# Patient Record
Sex: Male | Born: 1950 | Race: White | Hispanic: No | Marital: Married | Smoking: Never smoker
Health system: Southern US, Community
[De-identification: ages and names within clinical notes are randomized; demographics above are authoritative.]

## PROBLEM LIST (undated history)

## (undated) DIAGNOSIS — H7090 Unspecified mastoiditis, unspecified ear: Secondary | ICD-10-CM

## (undated) DIAGNOSIS — K2270 Barrett's esophagus without dysplasia: Secondary | ICD-10-CM

## (undated) DIAGNOSIS — N189 Chronic kidney disease, unspecified: Secondary | ICD-10-CM

## (undated) DIAGNOSIS — E7800 Pure hypercholesterolemia, unspecified: Secondary | ICD-10-CM

## (undated) DIAGNOSIS — M1990 Unspecified osteoarthritis, unspecified site: Secondary | ICD-10-CM

## (undated) DIAGNOSIS — L98499 Non-pressure chronic ulcer of skin of other sites with unspecified severity: Secondary | ICD-10-CM

## (undated) DIAGNOSIS — I10 Essential (primary) hypertension: Secondary | ICD-10-CM

## (undated) DIAGNOSIS — R7303 Prediabetes: Secondary | ICD-10-CM

## (undated) DIAGNOSIS — K219 Gastro-esophageal reflux disease without esophagitis: Secondary | ICD-10-CM

## (undated) DIAGNOSIS — IMO0001 Reserved for inherently not codable concepts without codable children: Secondary | ICD-10-CM

## (undated) DIAGNOSIS — S92353A Displaced fracture of fifth metatarsal bone, unspecified foot, initial encounter for closed fracture: Secondary | ICD-10-CM

## (undated) DIAGNOSIS — N400 Enlarged prostate without lower urinary tract symptoms: Secondary | ICD-10-CM

## (undated) HISTORY — PX: TONSILLECTOMY: SUR1361

## (undated) HISTORY — DX: Reserved for inherently not codable concepts without codable children: IMO0001

## (undated) HISTORY — PX: COLONOSCOPY: SHX174

## (undated) HISTORY — DX: Benign prostatic hyperplasia without lower urinary tract symptoms: N40.0

## (undated) HISTORY — DX: Essential (primary) hypertension: I10

## (undated) HISTORY — DX: Displaced fracture of fifth metatarsal bone, unspecified foot, initial encounter for closed fracture: S92.353A

## (undated) HISTORY — DX: Unspecified mastoiditis, unspecified ear: H70.90

## (undated) HISTORY — PX: BACK SURGERY: SHX140

## (undated) HISTORY — PX: TOTAL SHOULDER REPLACEMENT: SUR1217

## (undated) HISTORY — DX: Non-pressure chronic ulcer of skin of other sites with unspecified severity: L98.499

## (undated) HISTORY — PX: UPPER GASTROINTESTINAL ENDOSCOPY: SHX188

## (undated) HISTORY — DX: Barrett's esophagus without dysplasia: K22.70

## (undated) HISTORY — PX: KNEE ARTHROSCOPY: SUR90

---

## 2003-04-24 ENCOUNTER — Ambulatory Visit (HOSPITAL_COMMUNITY): Admission: RE | Admit: 2003-04-24 | Discharge: 2003-04-24 | Payer: Self-pay | Admitting: Internal Medicine

## 2003-10-16 ENCOUNTER — Ambulatory Visit (HOSPITAL_COMMUNITY): Admission: RE | Admit: 2003-10-16 | Discharge: 2003-10-16 | Payer: Self-pay | Admitting: Orthopedic Surgery

## 2003-10-16 ENCOUNTER — Ambulatory Visit (HOSPITAL_BASED_OUTPATIENT_CLINIC_OR_DEPARTMENT_OTHER): Admission: RE | Admit: 2003-10-16 | Discharge: 2003-10-16 | Payer: Self-pay | Admitting: Orthopedic Surgery

## 2005-07-14 ENCOUNTER — Ambulatory Visit (HOSPITAL_COMMUNITY): Admission: RE | Admit: 2005-07-14 | Discharge: 2005-07-14 | Payer: Self-pay | Admitting: Internal Medicine

## 2005-07-14 ENCOUNTER — Ambulatory Visit: Payer: Self-pay | Admitting: Internal Medicine

## 2005-07-14 ENCOUNTER — Encounter (INDEPENDENT_AMBULATORY_CARE_PROVIDER_SITE_OTHER): Payer: Self-pay | Admitting: Internal Medicine

## 2007-12-02 HISTORY — PX: HEMORRHOID SURGERY: SHX153

## 2007-12-02 HISTORY — PX: OTHER SURGICAL HISTORY: SHX169

## 2008-02-25 ENCOUNTER — Ambulatory Visit (HOSPITAL_COMMUNITY): Admission: RE | Admit: 2008-02-25 | Discharge: 2008-02-25 | Payer: Self-pay | Admitting: Surgery

## 2008-02-25 ENCOUNTER — Encounter (INDEPENDENT_AMBULATORY_CARE_PROVIDER_SITE_OTHER): Payer: Self-pay | Admitting: Surgery

## 2008-06-08 ENCOUNTER — Ambulatory Visit (HOSPITAL_COMMUNITY): Admission: RE | Admit: 2008-06-08 | Discharge: 2008-06-08 | Payer: Self-pay | Admitting: Orthopedic Surgery

## 2008-07-24 ENCOUNTER — Ambulatory Visit (HOSPITAL_BASED_OUTPATIENT_CLINIC_OR_DEPARTMENT_OTHER): Admission: RE | Admit: 2008-07-24 | Discharge: 2008-07-24 | Payer: Self-pay | Admitting: Orthopedic Surgery

## 2010-03-15 ENCOUNTER — Ambulatory Visit (HOSPITAL_COMMUNITY): Admission: RE | Admit: 2010-03-15 | Discharge: 2010-03-15 | Payer: Self-pay | Admitting: Urology

## 2010-08-08 ENCOUNTER — Encounter (INDEPENDENT_AMBULATORY_CARE_PROVIDER_SITE_OTHER): Payer: Self-pay | Admitting: *Deleted

## 2011-01-02 NOTE — Letter (Signed)
Summary: Recall Colonoscopy/Endoscopy, Change to Office Visit  [facility]  [address]   [place], [place] [address]   Phone: [phone]  Fax: [phone]      August 08, 2010   Frank [name] [address] [place], [place]  [address] [birth year]   Dear Mr. Stigall,   According to our records, it is time for you to schedule a Colonoscopy/Endoscopy. However, after reviewing your medical record, we recommend an office visit in order to determine your need for a repeat procedure.  Please call [phone] at your convenience to schedule an office visit. If you have any questions or concerns, please feel free to contact our office.   Sincerely,   Ave Filter  [facility] Ph: [phone]   Fax: [phone]

## 2011-01-24 ENCOUNTER — Ambulatory Visit (INDEPENDENT_AMBULATORY_CARE_PROVIDER_SITE_OTHER): Payer: BC Managed Care – PPO | Admitting: Urology

## 2011-01-24 DIAGNOSIS — N529 Male erectile dysfunction, unspecified: Secondary | ICD-10-CM

## 2011-01-24 DIAGNOSIS — N401 Enlarged prostate with lower urinary tract symptoms: Secondary | ICD-10-CM

## 2011-01-24 DIAGNOSIS — N323 Diverticulum of bladder: Secondary | ICD-10-CM

## 2011-01-24 DIAGNOSIS — N138 Other obstructive and reflux uropathy: Secondary | ICD-10-CM

## 2011-01-27 ENCOUNTER — Ambulatory Visit (INDEPENDENT_AMBULATORY_CARE_PROVIDER_SITE_OTHER): Payer: BC Managed Care – PPO | Admitting: Internal Medicine

## 2011-01-27 DIAGNOSIS — Z860100 Personal history of colon polyps, unspecified: Secondary | ICD-10-CM

## 2011-01-27 DIAGNOSIS — K2270 Barrett's esophagus without dysplasia: Secondary | ICD-10-CM

## 2011-01-27 DIAGNOSIS — K219 Gastro-esophageal reflux disease without esophagitis: Secondary | ICD-10-CM

## 2011-02-21 ENCOUNTER — Ambulatory Visit (INDEPENDENT_AMBULATORY_CARE_PROVIDER_SITE_OTHER): Payer: BC Managed Care – PPO | Admitting: Urology

## 2011-02-21 DIAGNOSIS — N529 Male erectile dysfunction, unspecified: Secondary | ICD-10-CM

## 2011-02-21 DIAGNOSIS — N401 Enlarged prostate with lower urinary tract symptoms: Secondary | ICD-10-CM

## 2011-02-28 ENCOUNTER — Encounter (INDEPENDENT_AMBULATORY_CARE_PROVIDER_SITE_OTHER): Payer: BC Managed Care – PPO | Admitting: Internal Medicine

## 2011-03-21 ENCOUNTER — Ambulatory Visit (HOSPITAL_COMMUNITY)
Admission: RE | Admit: 2011-03-21 | Discharge: 2011-03-21 | Disposition: A | Discharge status: Home / Self Care | Payer: BC Managed Care – PPO | Source: Ambulatory Visit | Attending: Internal Medicine | Admitting: Internal Medicine

## 2011-03-21 ENCOUNTER — Other Ambulatory Visit (INDEPENDENT_AMBULATORY_CARE_PROVIDER_SITE_OTHER): Payer: Self-pay | Admitting: Internal Medicine

## 2011-03-21 ENCOUNTER — Encounter (HOSPITAL_BASED_OUTPATIENT_CLINIC_OR_DEPARTMENT_OTHER): Payer: BC Managed Care – PPO | Admitting: Internal Medicine

## 2011-03-21 DIAGNOSIS — K5730 Diverticulosis of large intestine without perforation or abscess without bleeding: Secondary | ICD-10-CM

## 2011-03-21 DIAGNOSIS — Z860100 Personal history of colon polyps, unspecified: Secondary | ICD-10-CM | POA: Insufficient documentation

## 2011-03-21 DIAGNOSIS — Z79899 Other long term (current) drug therapy: Secondary | ICD-10-CM | POA: Insufficient documentation

## 2011-03-21 DIAGNOSIS — K208 Other esophagitis without bleeding: Secondary | ICD-10-CM

## 2011-03-21 DIAGNOSIS — K2990 Gastroduodenitis, unspecified, without bleeding: Secondary | ICD-10-CM

## 2011-03-21 DIAGNOSIS — K2270 Barrett's esophagus without dysplasia: Secondary | ICD-10-CM | POA: Insufficient documentation

## 2011-03-21 DIAGNOSIS — K219 Gastro-esophageal reflux disease without esophagitis: Secondary | ICD-10-CM

## 2011-03-21 DIAGNOSIS — D126 Benign neoplasm of colon, unspecified: Secondary | ICD-10-CM

## 2011-03-21 DIAGNOSIS — Z1211 Encounter for screening for malignant neoplasm of colon: Secondary | ICD-10-CM

## 2011-03-21 DIAGNOSIS — K644 Residual hemorrhoidal skin tags: Secondary | ICD-10-CM | POA: Insufficient documentation

## 2011-03-21 DIAGNOSIS — K2970 Gastritis, unspecified, without bleeding: Secondary | ICD-10-CM

## 2011-03-21 DIAGNOSIS — I10 Essential (primary) hypertension: Secondary | ICD-10-CM | POA: Insufficient documentation

## 2011-04-06 NOTE — Op Note (Signed)
NAMERICH, PAPROCKI               ACCOUNT NO.:  [id number]  MEDICAL RECORD NO.:  [id number]           PATIENT TYPE:  O  LOCATION:  DAYP                          FACILITY:  [facility]  PHYSICIAN:  Lionel December, M.D.    DATE OF BIRTH:  October 08, 1951  DATE OF PROCEDURE: DATE OF DISCHARGE:                              OPERATIVE REPORT   PROCEDURE:  Esophagogastroduodenoscopy followed by colonoscopy with snare polypectomy.  INDICATION:  Carney Frank [name] is 60 year old Caucasian male who has chronic GERD complicated by Barrett's esophagus and he also has history of colonic adenoma.  He is undergoing surveillance EGD and colonoscopy.  Procedures were reviewed with the patient.  Informed consent was obtained.  MEDS FOR CONSCIOUS SEDATION: 1. Cetacaine spray for pharyngeal topical anesthesia. 2. Demerol 50 mg IV. 3. Versed 8 mg IV in divided dose.  FINDINGS:  Procedure performed in endoscopy suite.  The patient's vital signs and O2 sat were monitored during the procedure and remained stable.  PROCEDURE:  Esophagogastroduodenoscopy.  The patient was placed in left lateral position and Olympus videoscope was passed through oropharynx without any difficulty into esophagus.  Esophagus mucosa of the proximal segments normal.  Distally there was single linear erosion proximal to the Barrett's epithelium.  A short segment salmon-colored mucosa with wavy border or margin.  It was less than 10-mm in length.  GE junction was estimated to be at 38 and hiatus was at 40.  On the way out biopsy was taken from Barrett's mucosa for histology.  Stomach.  It was empty and distended very well with insufflation.  Folds of proximal stomach are normal.  Examination of mucosa and body was normal.  In the antrum, there were few areas with focal erythema but no erosions or ulcers were noted.  Pyloric channel was patent.  Angularis, fundus, and cardia were examined by retroflexing scope were normal.  Duodenum.  Bulbar  mucosa was normal.  Scope was passed through second part of duodenal mucosa and folds were normal.  Endoscope was withdrawn and the patient prepared for procedure #2.  Colonoscopy.  Rectal examination performed.  No abnormality noted on external or digital exam.  Olympus videoscope was placed through rectum and advanced under vision into sigmoid colon.  Preparation was satisfactory.  He had scattered diverticula throughout the colon.  Some difficulty in passing the scope from sigmoid to descending colon as the segment was very tortuous.  Scope was passed into cecum which was identified by appendiceal orifice and ileocecal valve.  There was a 3-mm polyp at cecum which was ablated via cold biopsy.  There was another 6- mm polyp on a fold at ascending colon which was snared and retrieved for sludge examination.  Mucosa, rest of the colon was normal.  Rectal mucosa similarly was normal.  Scope was retroflexed to examine anorectal junction and small hemorrhoids noted below the dentate line.  Endoscope was withdrawn.  Withdrawal time was 9 minutes.  The patient tolerated the procedures well.  FINAL DIAGNOSIS:  Single erosion at distal esophagus.  Short segment Barrett's length less than 1 cm, biopsy taken for routine histology.  2 cm size sliding  hiatal hernia.  GE junction was at 38 and hiatus at 40.  Nonerosive gastritis.  Please note that his H. pylori serology in the past has been negative.  Scattered diverticula throughout the colon.  Small cecal polyp ablated via cold biopsy and 6-mm polyp snared from the ascending colon.  External hemorrhoids.  RECOMMENDATIONS:  We will continue antireflux measures.  We will increase omeprazole to 20 mg b.i.d. for 3 months and thereafter once a month.  New prescription given for 60 doses with 2 refills.  No aspirin for 1 week.  I will be gone contacting the patient with results of biopsy and further recommendations.     Lionel December, M.D.     NR/MEDQ  D:  03/21/2011  T:  03/21/2011  Job:  [id number]  cc:   Kingsley Callander. Ouida Sills, MD Fax: [phone]  Electronically Signed by Lionel December M.D. on 04/06/2011 09:47:24 PM

## 2011-04-15 NOTE — Op Note (Signed)
Frank [name], Frank [name]               ACCOUNT NO.:  [id number]   MEDICAL RECORD NO.:  [id number]          PATIENT TYPE:  AMB   LOCATION:  DAY                          FACILITY:  [facility]   PHYSICIAN:  Sandria Bales. Ezzard Standing, M.D.  DATE OF BIRTH:  Jul 24, 1951   DATE OF PROCEDURE:  02/25/2008  DATE OF DISCHARGE:                               OPERATIVE REPORT   Operative Date here ???   PREOPERATIVE DIAGNOSIS:  Grade 3/4 internal hemorrhoids.   POSTOPERATIVE DIAGNOSIS:  Grade 3/4 internal hemorrhoids.   PROCEDURE:  1. Flexible sigmoidoscopy to 30 cm.  2. PPH procedure (procedure for prolapsed hemorrhoids).   SURGEON:  Dr. Algis Downs. Newman.   FIRST ASSISTANT:  None.   ANESTHESIA:  General endotracheal in a prone position with 20 mL of 1%  Xylocaine.   COMPLICATIONS:  None.   INDICATIONS FOR PROCEDURE:  [name] is a 60 year old white male  patient of Dr. Carylon Perches who has had symptomatic hemorrhoids for years.  I originally saw him in September, 2007, for the hemorrhoids.  Because  of personal reasons he delayed any surgery but he comes in now where  with every bowel movement he notices hemorrhoids prolapse out, he has  some bleeding from his rectum.  He has had a negative colonoscopy by Dr.  Karilyn Cota approximately 2003.  A discussion carried out with him about  hemorrhoid surgery.   I discussed the indications, potential complications.  Potential  complications include, but are not limited to, bleeding, infection,  nerve injury and recurrence of the hemorrhoids.   OPERATIVE NOTE:  The patient was placed in a prone position.  He had  completed a HalfLytely bowel prep at home.  His buttocks was taped  apart.  A time out was held identifying the patient and the procedure.  First I did a flexible sigmoidoscopy to 30 cm and I saw no evidence of  any colonic polyp, lesion or mass.   I then prepped the perianal area with Betadine solution.  I first did an  inspection and visualized his grade 3-4  prolapsing hemorrhoids.  I first  placed a #2-0 Prolene pursestring approximately 5 cm above the dentate  line.   I then used the [employer] PPH staple device.  I tied this pursestring  around the anvil of the PPH staple device.  I fired the [facility] and pulled  this down to where it looks like at about 4 cm at the anal verge.  I  held the device 2 minutes prior to firing and 1 minute after firing.  I  then inspected the staple line and the staples looked like they were  about 1.5 to cm above the dentate line, there was no bleeding.  I put a  sponge in the anus and left that for 5 minutes, again there was no  bleeding.  I then placed a Gelfoam gauze, with Nupercaine ointment  covering this, into the anal canal.   The patient tolerated procedure well, was transferred to the recovery  room in good condition.  He will be discharged home today, start sitz  bath 3 times  a day for at least 1 week's time, see me back in 3 weeks.  He knows to call for any interval problem.      Sandria Bales. Ezzard Standing, M.D.  Electronically Signed     DHN/MEDQ  D:  02/25/2008  T:  02/25/2008  Job:  [id number]   cc:   Kingsley Callander. Ouida Sills, MD  Fax: [phone]

## 2011-04-15 NOTE — Op Note (Signed)
Frank [name], Frank [name]               ACCOUNT NO.:  [id number]   MEDICAL RECORD NO.:  [id number]          PATIENT TYPE:  AMB   LOCATION:  DSC                          FACILITY:  [facility]   PHYSICIAN:  Robert A. Thurston Hole, M.D. 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   PROCEDURE:  1. Right knee examination under anesthesia followed by arthroscopic      partial medial and lateral meniscectomies.  2. Right knee chondroplasty with partial synovectomy.  3. Right knee spur excision.   SURGEON:  Elana Alm. Thurston Hole, MD   ANESTHESIA:  General.   OPERATIVE TIME:  40 minutes.   COMPLICATIONS:  None.   INDICATIONS FOR PROCEDURE:  Frank [name] is a 60 year old gentleman who  has had significant right knee pain for the past 4-5 months, increasing  in nature with examined MRI documenting meniscal tear, chondromalacia,  and synovitis.  He has failed conservative care and is now to undergo  arthroscopy.   DESCRIPTION:  Frank [name] was brought to the operating room on July 24, 2008 after a knee block was placed in the holding area by anesthesia.  He was placed on operating table in supine position.  After being placed  under anesthesia, his right knee was examined.  He had full range of  motion, and his knee with stable ligamentous exam with normal patellar  tracking.  The right leg was prepped using sterile DuraPrep and draped  using sterile technique. Originally, through an anterolateral portal,  the arthroscope with a pump attached was placed into an anteromedial  portal and an arthroscopic probe was placed.  On initial inspection of  medial  compartment, he had 75% grade 3 chondromalacia, which was  debrided.  Medial meniscus tear of posterior and medial horn of which 30-  40% was resected back to a stable rim.  Intercondylar notch was  inspected and anterior and posterior crucial ligaments were normal.  There was a large anterior spur on the tibial spine just in front of the  ACL impinging on extension, and this was removed with a 6-mm probe.  Lateral compartment showed 25-30% grade 4 and 50-60% grade 3  chondromalacia, which was debrided.  The lateral meniscus showed severe  tearing posterior and lateral anterior horn of which 70% of  posterolateral horn was resected, 30% of the anterior horn resected back  to a stable rim.  Patellofemoral joint showed 25-30% grade 4  chondromalacia and 50-60% grade 3 chondromalacia, which was debrided.  The patella tracked normally. Moderate synovitis and medial and lateral  gutters were debrided, otherwise they are free of pathology.  At this  time, it was felt that all pathology have been satisfactorily addressed.  The instruments were removed.  The portals were closed with 3-0 nylon  suture and injected with 0.25% Marcaine  with epinephrine and 4 mg of  morphine.  Sterile dressings were applied, and the patient awakened and  taken to the recovery room in stable condition.   FOLLOWUP CARE:  Frank [name] will be followed as an outpatient on Vicodin  and Mobic.  See him back in the office in a week for sutures out and  followup.      Robert A. Thurston Hole, M.D.  Electronically Signed     RAW/MEDQ  D:  07/24/2008  T:  07/24/2008  Job:  [id number]

## 2011-04-18 NOTE — Op Note (Signed)
NAME:  Frank [name], Frank [name]                         ACCOUNT NO.:  [id number]   MEDICAL RECORD NO.:  [id number]                   PATIENT TYPE:  AMB   LOCATION:  DSC                                  FACILITY:  [facility]   PHYSICIAN:  Robert A. Thurston Hole, M.D.              DATE OF BIRTH:  1951/08/21   DATE OF PROCEDURE:  10/16/2003  DATE OF DISCHARGE:                                 OPERATIVE REPORT   PREOPERATIVE DIAGNOSIS:  1. Right shoulder rotator cuff tear with impingement.  2. Right shoulder acromioclavicular joint spurring.  3. Left heel plantar fasciitis.   POSTOPERATIVE DIAGNOSIS:  1. Right shoulder partial rotator cuff tear with partial glenoid labrum     tear.  2. Right shoulder impingement.  3. Right shoulder acromioclavicular joint spurring.  4. Left heel plantar fasciitis.   OPERATION PERFORMED:  1. Right shoulder examination under anesthesia followed arthroscopic partial     labral tear debridement and partial rotator cuff tear debridement.  2. Right shoulder subacromial decompression.  3. Right shoulder distal clavicle excision.  4. Left heel cortisone injection.   SURGEON:  Elana Alm. Thurston Hole, M.D.   ASSISTANT:  Julien Girt, P.A.   ANESTHESIA:  General.   OPERATIVE TIME:  45 minutes.   COMPLICATIONS:  None.   INDICATIONS FOR PROCEDURE:  Frank [name] is a 60 year old gentleman who  injured his right shoulder lifting approximately six to seven months ago  with exam on MRI documenting a partial versus complete rotator cuff tear  with impingement and acromioclavicular joint spurring and he is now to  undergo arthroscopy because he has otherwise failed conservative care.  He  also has significant left heel pain where he has had plantar fasciitis and  is to undergo an injection.   DESCRIPTION OF PROCEDURE:  Frank [name] was brought to the operating room on  October 16, 2003 after an interscalene block had been placed in the holding  area by anesthesia for  postoperative pain control.  He was placed on the  operative table in supine position  After being placed under general  anesthesia, his left heel was prepped with alcohol and injected with 1mL of  Depo-Medrol 40 mg and 2mL of 0.5% Marcaine under sterile conditions.  At  this point then the shoulder was examined under anesthesia.  He had full  range of motion and his shoulder was stable to ligamentous exam.  He was  placed in beach chair position and his shoulder and arm were prepped using  sterile DuraPrep and draped using sterile technique.  Originally, through a  posterior arthroscopic portal, the arthroscope with a pump attached was  placed and through an anterior portal an arthroscopic probe was placed.  On  initial inspection, the articular cartilage in the glenohumeral joint was  intact.  The anterior labrum and superior labrum partial tearing 25 to 30%  which was debrided.  Posterior labrum intact.  Biceps tendon  anchor was  slightly hypermobile but it was intact.  The biceps tendon was intact.  The  inferior labrum and anterior inferior glenohumeral ligament complex was  intact.  The rotator cuff, the articular side showed a small partial tear  25% which was debrided. The rest of the rotator cuff was intact.  A lateral  arthroscopic portal was made and the subacromial space was entered.  A large  amount of bursitis was resected.  The rotator cuff showed a significant  bursal side tear but it was not detached from the greater tuberosity and  thus debridement was carried out but a repair did not need to be achieved or  performed.  Impingement was noted and a subacromial decompression was  carried out removing 6 to 8 mm of the undersurface of the anterior,  anterolateral and anteromedial acromion and CA ligament release carried out  as well.  The acromioclavicular joint showed significant spurring and  degenerative changes and the distal 5 to 6 mm of the clavicle was resected  with a  6 mm bur.  After this was done, the shoulder could be brought through  a full range of motion with no impingement on the rotator cuff.  At this  point it was felt that all the pathology had been satisfactorily addressed.  The instruments were removed.  Portals were closed with 3-0 nylon suture.  Sterile dressings and a sling applied.  The patient was awakened and taken  to recovery room in stable condition.   FOLLOW UP:  Frank [name] will be followed as an outpatient on Percocet for  pain with early physical therapy.  See him back in the office in a week for  suture removal and follow-up.                                               Robert A. Thurston Hole, M.D.    RAW/MEDQ  D:  10/16/2003  T:  10/16/2003  Job:  [id number]

## 2011-04-18 NOTE — Op Note (Signed)
Frank [name], Frank [name]               ACCOUNT NO.:  [id number]   MEDICAL RECORD NO.:  [id number]          PATIENT TYPE:  AMB   LOCATION:  DAY                           FACILITY:  [facility]   PHYSICIAN:  Frank [name], M.D.    DATE OF BIRTH:  1950/12/22   DATE OF PROCEDURE:  07/14/2005  DATE OF DISCHARGE:                                 OPERATIVE REPORT   PROCEDURE:  Esophagogastroduodenoscopy followed by colonoscopy.   INDICATIONS:  Frank [name] is a 60 year old Caucasian male who has chronic GERD  complicated by short segment Barrett's esophagus who is here for  surveillance exam.  His last exam was in May 2004.  He is on entire reflux  measures and PPI with good control of his heartburn. He also has history of  colonic polyps.  He had an adenoma removed from the sigmoid colon five years  ago, and family history is positive for colon carcinoma in his brother.   Both procedures and risks were reviewed the patient, informed consent was  obtained.   PREMEDICATION:  Cetacaine spray for pharyngeal topical anesthesia, Demerol  25 mg IV, Versed 7 mg IV in divided dose.   DESCRIPTION OF PROCEDURE:  Procedure performed in endoscopy suite. The  patient's vital signs and O2 sat were monitored during procedure and  remained stable.   PROCEDURE:  1.  Esophagogastroduodenoscopy.  The patient was placed left lateral      position.  An Olympus videoscope was passed from the oropharynx without      any difficulty into esophagus.  Procedures was performed in endoscopy      suite.  The patient's vital signs and O2 sat were monitored during the      procedure and remained stable.   Esophagus: Mucosa of the esophagus was normal.  Distally, he had short  segment Barrett's esophagus with proximal margin at 38 cm.  The involvement  was asymmetric. Around 6 o'clock was about 15 mm long segment and was  shorter across at 12 o'clock. This short segment was felt to be  circumferential.  The GE junction was at 39 and  hiatus was at 41 cm.   Stomach:  It was empty and distended very well with insufflation.  Folds of  the proximal stomach were normal. Examination of the mucosa at body, antrum,  pyloric channel as well as angularis, fundus and cardia was normal.   Duodenum bulbar mucosa was normal.  The scope was passed in the second part  of the duodenum.  Mucosa and folds were normal.  Endoscope was withdrawn.  On the way out biopsy was taken from Barrett's mucosa for routine histology.  The patient prepared for procedure #2.   Colonoscopy:  Rectal examination was performed.  No abnormality noted  external or digital exam.  Olympus videoscope was placed in the rectum and  advanced under vision into sigmoid colon. Tortuous sigmoid colon with  satisfactory prep.  Scope was advanced to the redundant sigmoid colon.  The  patient was turned in supine position.  The scope  was passed to splenic  flexure beyond.  He had diverticula  which was scattered throughout the colon  but there were few in number.  There were two diverticula at cecum.  Cecal  landmarks, i.e., appendiceal orifice and ileocecal valve were well seen and  picture taken for the record.  As the scope was withdrawn, the colonic  mucosa was carefully examined. There was a single small polyp at mid  ascending colon which was ablated via cold biopsy. Mucosa of the rest of the  colon was normal.  Rectal mucosa similarly was normal.  Scope was  retroflexed to examine anorectal junction.  Small hemorrhoids were noted  above the dentate line. Endoscope was then withdrawn.  The patient tolerated  the procedures well.   FINAL DIAGNOSES:  1.  Short segment Barrett's esophagus with 10-15 mm in length with      asymmetric involvement distally.  2.  Sliding hiatal hernia, 2 cm.  Asymmetrical of all main. Biopsy taken      looking for was dysplasia.  3.  Small sliding hiatal hernia.  4.  Normal examination of the stomach and first and second part of  the      duodenum.  Please note that on last exam in May 2004, he was found to      have a large bulbar ulcer.  5.  Pan colonic diverticulosis.  He has a few diverticula scattered      throughout the colon.  6.  Small polyp ablated via cold biopsy from the ascending colon.  7.  Small internal hemorrhoids.   RECOMMENDATIONS:  1.  He  will continue anti-reflux measures and Prevacid as before.  2.  High-fiber diet next.  3.  Fiber choice two tablets every day.  4.  I will be contacting the patient with biopsy results and further      recommendations.      Frank [name], M.D.  Electronically Signed     NR/MEDQ  D:  07/14/2005  T:  07/14/2005  Job:  [id number]   cc:   Kingsley Callander. Ouida Sills, MD  [address]  [place]  [place] [address]  Fax: [phone]

## 2011-04-18 NOTE — Op Note (Signed)
NAME:  Frank [name], Frank [name]                         ACCOUNT NO.:  [id number]   MEDICAL RECORD NO.:  [id number]                   PATIENT TYPE:  AMB   LOCATION:  DAY                                  FACILITY:  [facility]   PHYSICIAN:  Lionel December, M.D.                 DATE OF BIRTH:  1951/10/18   DATE OF PROCEDURE:  04/24/2003  DATE OF DISCHARGE:                                 OPERATIVE REPORT   PROCEDURE:  Esophagogastroduodenoscopy with esophageal dilatation.   INDICATIONS:  The patient is a 60 year old Caucasian male who had an episode  of syncope eight days ago, followed by coffee-grounds emesis and melena.  He  had an appointment to see Dr. Ouida Sills last week Friday.  He therefore decided  to wait.  He was noted to have a hemoglobin of 9.6.  He has been on Bextra  and more recently had been on Aleve for arthritis.  Therefore, DUB with  bleeding was suspected.  He was hemodynamically stable, and it was decided  to work him up on an outpatient basis.  He also gives a history of  intermittent dysphagia.  He states it is not bad enough and previously he  has not wanted to have his esophagus dilated but now that he is undergoing  EGD, he wants to have ED if necessary.  The procedure risks were reviewed  with the patient.  Informed consent was obtained.   PREMEDICATION:  Cetacaine spray for pharyngeal topical anesthesia, Demerol  25 mg IV, Versed 7 mg IV in divided dose.   INSTRUMENT USED:  Olympus video system.   FINDINGS:  Procedure performed in endoscopy suite.  The patient's vital  signs and O2 saturation were monitored during the procedure and remained  stable.  The patient was placed in the left lateral recumbent position and  the endoscope was passed via oropharynx without any difficulty into the  esophagus.   Esophagus:  The mucosa of the esophagus normal except distally he had short-  segment Barrett's esophagus, which was less than centimeters in length.  The  gastroesophageal  junction was noted to be at 40 cm from the incisors with  stricture and mucosal edema and nodularity at 12 o'clock, which was biopsied  post dilatation.  There was a 3 cm size hiatal hernia.  The diaphragmatic  hiatus was at 43 cm from the incisors.   Stomach:  It was empty and distended very well with insufflation.  Folds of  proximal stomach were normal.  Examination of the mucosa revealed multiple  antral erosions, which appeared to be healing.  Pyloric channel was patent.  Angularis, fundus, and cardia were examined by retroflexing the scope and  were normal.   Duodenum:  Examination of the bulb revealed a deep punched-out ulcer about a  centimeter in diameter.  There were no stigmata of bleeding.  Mucosa and  folds of the second part of the  duodenum were normal.  The endoscope was  withdrawn.  Esophagus was dilated by passing 56 [place] Maloney dilator.  As  the dilatation was complete, the endoscope was passed again and the  stricture was noted to have been disrupted.  Biopsy was taken from this  mucosa at GE junction with nodularity.  The endoscope was withdrawn.  The  patient tolerated the procedure well.   FINAL DIAGNOSES:  1. 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  2. A small sliding hiatal hernia.  The hernia was 3 cm in length.  3. Erosive antral gastritis.  Bulbar ulcer felt to be cause of recent     gastrointestinal blood loss.   RECOMMENDATIONS:  1. He will continue Aciphex at 20 mg p.o. q.a.m.  2. H. pylori serology will be checked today.  3. He should hold off his NSAID, can take Tylenol up to 3 g per day.  4. I will contact the patient with the biopsy results.  5. He also needs to be on antireflux measures.  6. If biopsy from the esophagus does not show any suspicious changes, will     plan to bring him back for a follow-up EGD in two years from now.                                                Lionel December, M.D.    NR/MEDQ  D:  04/24/2003  T:  04/24/2003  Job:  [id number]   cc:   Kingsley Callander. Ouida Sills, M.D.  [address]  [place]  [place] [address]  Fax: [phone]

## 2011-08-25 LAB — BASIC METABOLIC PANEL
BUN: 17
CO2: 29
Calcium: 9.2
Chloride: 107
Creatinine, Ser: 1.05
GFR calc Af Amer: >60
GFR calc non Af Amer: >60
Glucose, Bld: 106 — ABNORMAL HIGH
Potassium: 4.2
Sodium: 142

## 2011-08-25 LAB — HEMOGLOBIN AND HEMATOCRIT, BLOOD
HCT: 41.7
Hemoglobin: 14.5

## 2011-09-05 ENCOUNTER — Ambulatory Visit (INDEPENDENT_AMBULATORY_CARE_PROVIDER_SITE_OTHER): Payer: BC Managed Care – PPO | Admitting: Urology

## 2011-09-05 DIAGNOSIS — N401 Enlarged prostate with lower urinary tract symptoms: Secondary | ICD-10-CM

## 2011-09-05 DIAGNOSIS — N323 Diverticulum of bladder: Secondary | ICD-10-CM

## 2012-01-27 IMAGING — CT CT ABDOMEN WO/W CM
2 of 10 series · 10 of 46 positions shown, 16 images · IV contrast (agent unspecified)
Comparison: None.

CLINICAL DATA: Microscopic painless hematuria intermittently for
the past 2 years.  Previous left inguinal hernia repair.

CT ABDOMEN AND PELVIS WITHOUT AND WITH CONTRAST
TECHNIQUE: Multidetector CT imaging of the abdomen and pelvis was
performed following the standard protocol before and following the
bolus administration of intravenous contrast.
Contrast: 125 ml [id number]

[Series 8: post · axial · 0.74mm/px · z∈[-420,-4]mm · 8 of 107 slices shown, 13 images]
[im 12/107  soft-tissue]
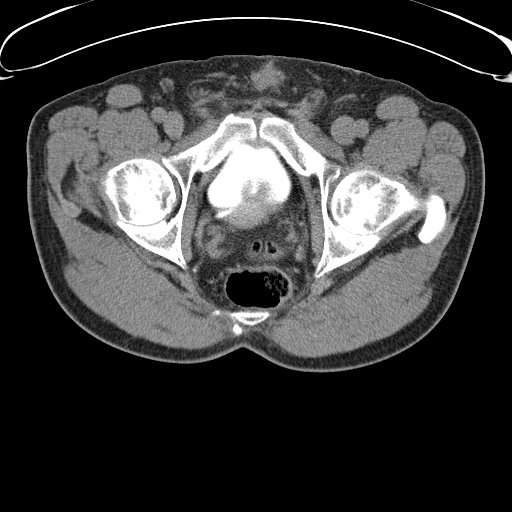
[im 12/107  bone]
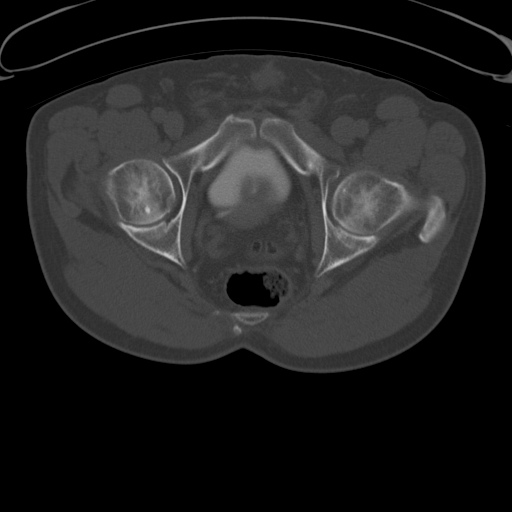
[im 24/107  soft-tissue]
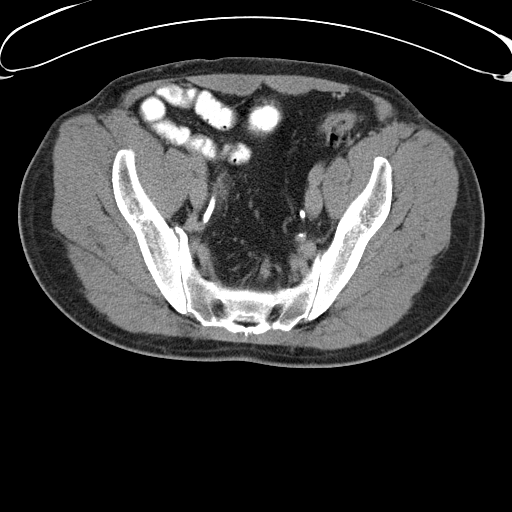
[im 36/107  soft-tissue]
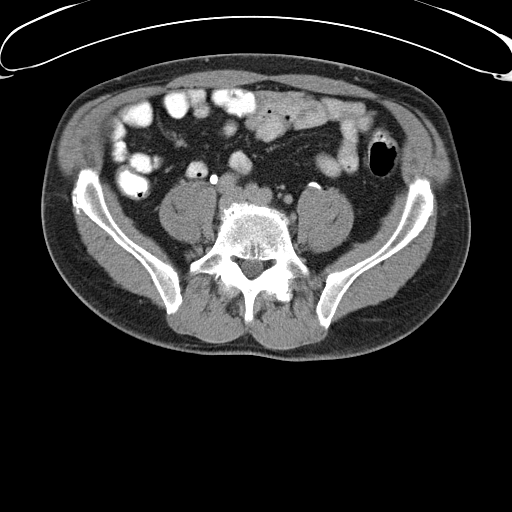
[im 48/107  soft-tissue]
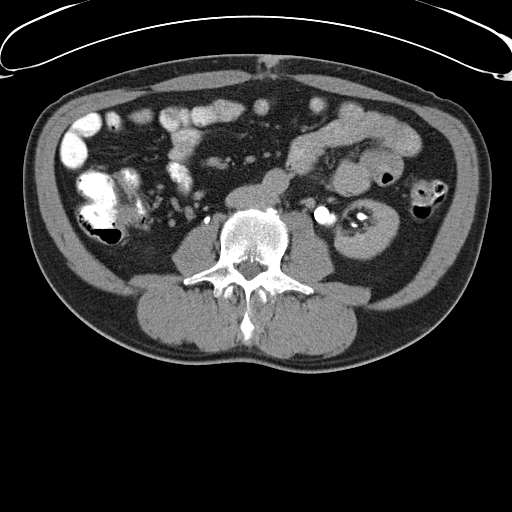
[im 59/107  soft-tissue]
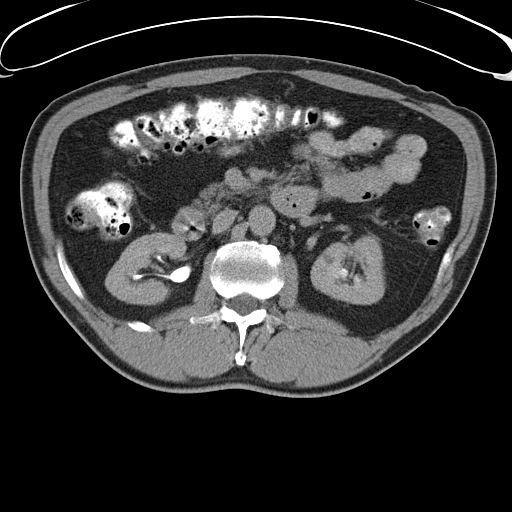
[im 59/107  lung]
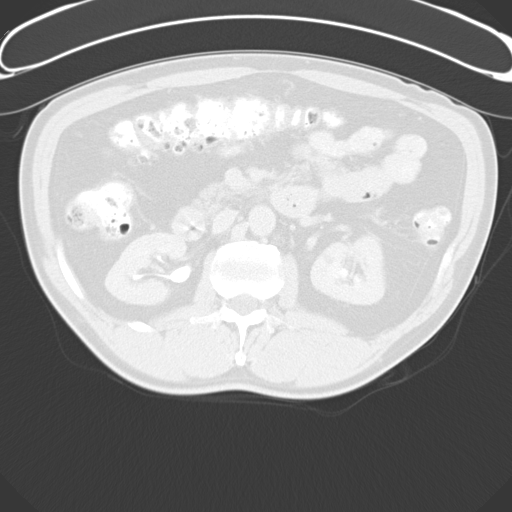
[im 71/107  soft-tissue]
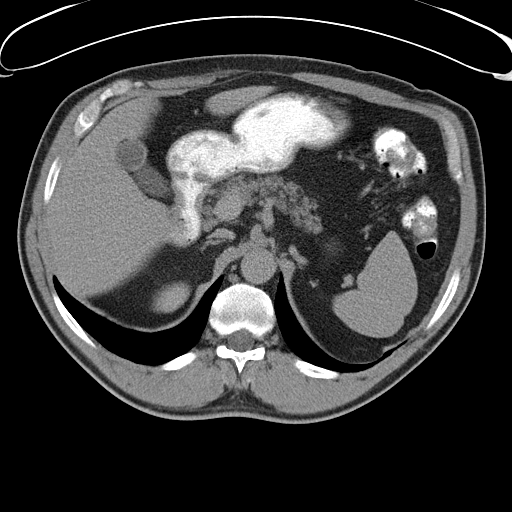
[im 71/107  lung]
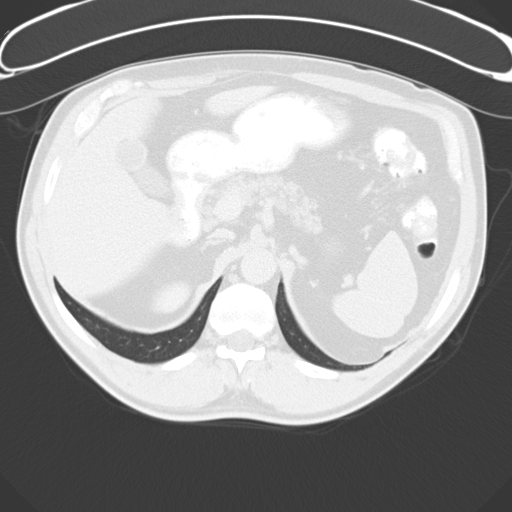
[im 83/107  soft-tissue]
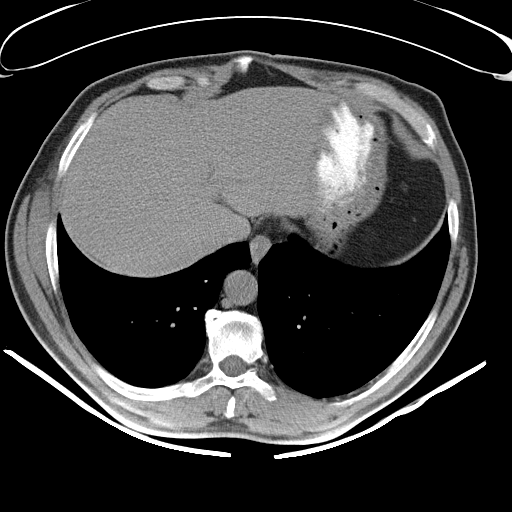
[im 83/107  lung]
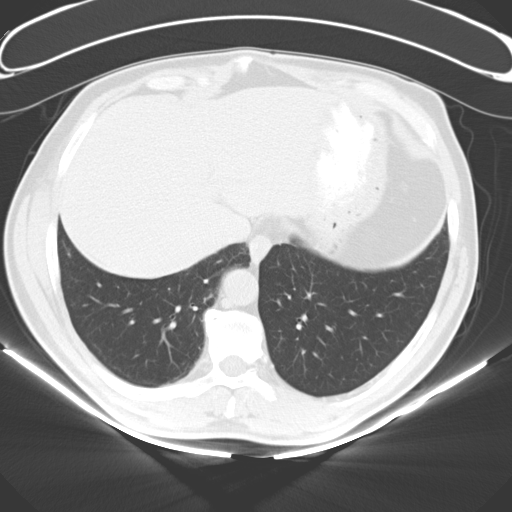
[im 95/107  soft-tissue]
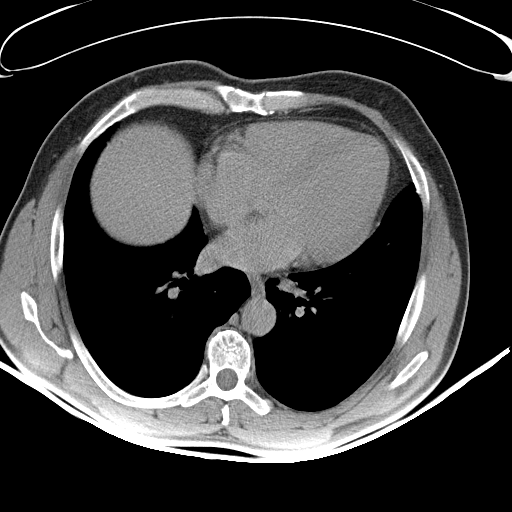
[im 95/107  lung]
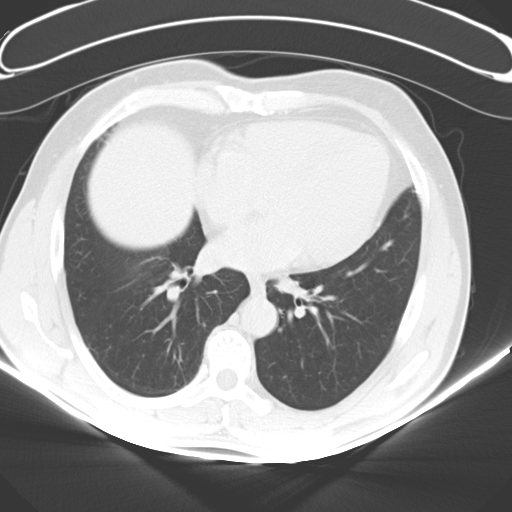

[Series 607: delay coronal · coronal · delayed · 1.04mm/px · 2 of 97 slices shown, 3 images]
[im 33/97  soft-tissue]
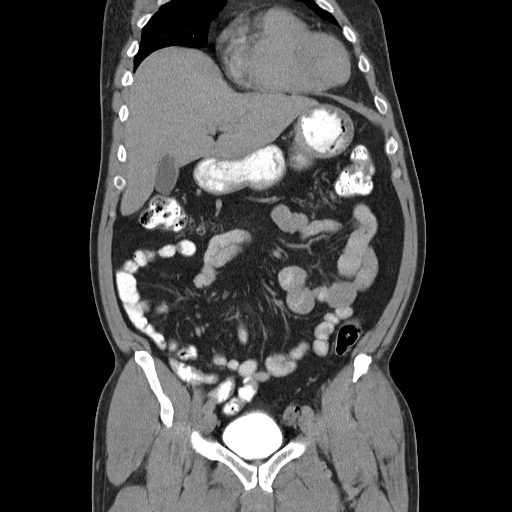
[im 33/97  bone]
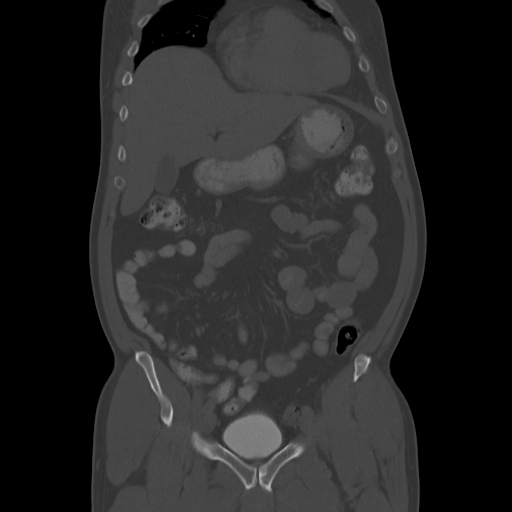
[im 65/97  soft-tissue]
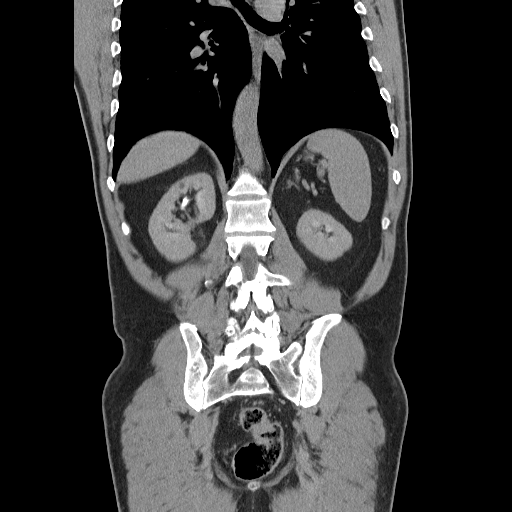

[10 of 46 positions shown; findings below may reference images not displayed]

FINDINGS: Tiny mid to upper right renal calculus.  Mildly
prominent extrarenal pelves bilaterally, greater on the left.  No
bladder or ureteral calculi and no hydronephrosis.  No urinary
tract masses seen.  There is a 6 mm cyst in the upper pole of the
right kidney and an 8 mm cyst in the upper pole of the left kidney.
The prostate gland is mildly to moderately enlarged, with mild
protrusion into the bladder base.

Small right inguinal hernia containing fat.  Multiple colonic
diverticula without evidence of diverticulitis.  Gallbladder
phrygian cap.  Normal appearing appendix.  Unremarkable liver,
spleen, pancreas and adrenal glands.  No enlarged lymph nodes.
Clear lung bases.  Lumbar and lower thoracic spine degenerative
changes, including facet degenerative changes at the L4-5 level
with associated grade 1 anterolisthesis.
IMPRESSION: 1.  Tiny, nonobstructing mid to upper right renal calculus.
2.  Mildly to moderately enlarged prostate gland.
3.  Small bilateral renal parenchymal cysts.
4.  Small right inguinal hernia containing fat.
5.  Colonic diverticulosis.

## 2012-04-05 ENCOUNTER — Encounter (INDEPENDENT_AMBULATORY_CARE_PROVIDER_SITE_OTHER): Payer: Self-pay | Admitting: *Deleted

## 2012-05-17 ENCOUNTER — Ambulatory Visit (INDEPENDENT_AMBULATORY_CARE_PROVIDER_SITE_OTHER): Payer: BC Managed Care – PPO | Admitting: Internal Medicine

## 2012-05-24 ENCOUNTER — Ambulatory Visit (INDEPENDENT_AMBULATORY_CARE_PROVIDER_SITE_OTHER): Payer: BC Managed Care – PPO | Admitting: Internal Medicine

## 2012-05-24 ENCOUNTER — Encounter (INDEPENDENT_AMBULATORY_CARE_PROVIDER_SITE_OTHER): Payer: Self-pay | Admitting: Internal Medicine

## 2012-05-24 VITALS — BP 124/82 | HR 78 | Temp 98.0°F | Resp 18 | Ht 71.0 in | Wt 181.6 lb

## 2012-05-24 DIAGNOSIS — Z86010 Personal history of colonic polyps: Secondary | ICD-10-CM

## 2012-05-24 DIAGNOSIS — K219 Gastro-esophageal reflux disease without esophagitis: Secondary | ICD-10-CM | POA: Insufficient documentation

## 2012-05-24 DIAGNOSIS — I10 Essential (primary) hypertension: Secondary | ICD-10-CM | POA: Insufficient documentation

## 2012-05-24 NOTE — Patient Instructions (Signed)
Call if you have swallowing difficulty or omeprazole stops working. Next colonoscopy in 4 years.

## 2012-05-24 NOTE — Progress Notes (Signed)
Presenting complaint;  Follow for chronic GERD.  Subjective:  Frank [name] is a 61 year old Caucasian male who is in for yearly visit. He has chronic GERD and history of colonic adenomas. His last EGD and colonoscopy was in April 2012. Endoscopically he was felt to have short segment Barrett's a biopsy was negative. He also had single erosion at distal esophagus and a small sliding hiatal hernia. He had 2 tubular adenomas removed from his colon. He also 2 diverticula throughout the colon. He feels fine. He rarely experiences heartburn. He does have intermittent need to clear his throat but denies sore throat chronic cough or hoarseness. He also denies abdominal pain melena or rectal bleeding. His bowels move daily.  Current Medications: Current Outpatient Prescriptions  Medication Sig Dispense Refill  . amLODipine (NORVASC) 5 MG tablet Take 5 mg by mouth daily.      . calcium carbonate (OS-CAL) 600 MG TABS Take 600 mg by mouth 2 (two) times daily with a meal.      . fish oil-omega-3 fatty acids 1000 MG capsule Take 1 g by mouth daily.      . Ginger, Zingiber officinalis, (GINGER ROOT PO) Take by mouth daily.      Marland Kitchen losartan (COZAAR) 100 MG tablet Take 100 mg by mouth daily.      . Multiple Vitamin (MULTIVITAMIN) capsule Take 1 capsule by mouth daily.      . nabumetone (RELAFEN) 750 MG tablet Take 750 mg by mouth 2 (two) times daily.      Marland Kitchen OMEPRAZOLE PO Take 20 mg by mouth.      . TRAMADOL HCL PO Take by mouth as needed.      . TURMERIC PO Take by mouth daily.         Objective: Blood pressure 124/82, pulse 78, temperature 98 F (36.7 C), temperature source Oral, resp. rate 18, height 5\' 11"  (1.803 m), weight 181 lb 9.6 oz (82.373 kg). Conjunctiva is pink. Sclera is nonicteric Oropharyngeal mucosa is normal. No neck masses or thyromegaly noted. Abdomen symmetrical soft and nontender without organomegaly or masses. No LE edema or clubbing noted.    Assessment:  #1. Chronic GERD. His  symptoms are well controlled but treatment. He has had his esophagus dilated in the past. Endoscopically he was felt to have Barrett's esophagus but it could not be confirmed histologically. Therefore he does not need periodic surveillance exam. #2. History of colonic adenomas. He had 2 removed on his last colonoscopy of April 2012.   Plan: Continue anti-reflux measures and PPI. Call if you develop dysphagia her breakthrough symptoms. Next colonoscopy in 4 years.

## 2013-12-07 ENCOUNTER — Other Ambulatory Visit: Payer: Self-pay

## 2013-12-07 ENCOUNTER — Encounter (HOSPITAL_COMMUNITY)
Admission: RE | Admit: 2013-12-07 | Discharge: 2013-12-07 | Disposition: A | Discharge status: Home / Self Care | Payer: BC Managed Care – PPO | Source: Ambulatory Visit | Attending: Orthopedic Surgery | Admitting: Orthopedic Surgery

## 2013-12-07 ENCOUNTER — Encounter (HOSPITAL_BASED_OUTPATIENT_CLINIC_OR_DEPARTMENT_OTHER): Payer: Self-pay | Admitting: *Deleted

## 2013-12-07 LAB — BASIC METABOLIC PANEL
BUN: 23 mg/dL (ref 6–23)
CO2: 24 mEq/L (ref 19–32)
Calcium: 9.5 mg/dL (ref 8.4–10.5)
Chloride: 104 mEq/L (ref 96–112)
Creatinine, Ser: 1.24 mg/dL (ref 0.50–1.35)
GFR calc Af Amer: 69 mL/min — ABNORMAL LOW (ref >90)
GFR calc non Af Amer: 60 mL/min — ABNORMAL LOW (ref >90)
Glucose, Bld: 88 mg/dL (ref 70–99)
Potassium: 4.3 mEq/L (ref 3.7–5.3)
Sodium: 142 mEq/L (ref 137–147)

## 2013-12-07 NOTE — Progress Notes (Signed)
Will go to [facility] for [id number]

## 2013-12-08 ENCOUNTER — Other Ambulatory Visit: Payer: Self-pay | Admitting: Physician Assistant

## 2013-12-08 ENCOUNTER — Encounter: Payer: Self-pay | Admitting: Physician Assistant

## 2013-12-08 DIAGNOSIS — H7090 Unspecified mastoiditis, unspecified ear: Secondary | ICD-10-CM | POA: Insufficient documentation

## 2013-12-08 DIAGNOSIS — N400 Enlarged prostate without lower urinary tract symptoms: Secondary | ICD-10-CM | POA: Insufficient documentation

## 2013-12-08 DIAGNOSIS — S92353A Displaced fracture of fifth metatarsal bone, unspecified foot, initial encounter for closed fracture: Secondary | ICD-10-CM | POA: Insufficient documentation

## 2013-12-08 DIAGNOSIS — S92302D Fracture of unspecified metatarsal bone(s), left foot, subsequent encounter for fracture with routine healing: Secondary | ICD-10-CM

## 2013-12-08 DIAGNOSIS — L98499 Non-pressure chronic ulcer of skin of other sites with unspecified severity: Secondary | ICD-10-CM

## 2013-12-08 DIAGNOSIS — K2270 Barrett's esophagus without dysplasia: Secondary | ICD-10-CM

## 2013-12-08 DIAGNOSIS — I10 Essential (primary) hypertension: Secondary | ICD-10-CM | POA: Insufficient documentation

## 2013-12-08 DIAGNOSIS — IMO0001 Reserved for inherently not codable concepts without codable children: Secondary | ICD-10-CM | POA: Insufficient documentation

## 2013-12-08 NOTE — H&P (Signed)
Frank [name] is an 63 y.o. male.   Chief Complaint: left foot 5th metatarsal fracture HPI: Mr. Frank [name] is a 63 year old seen for evaluation of a left 5th metatarsal shaft fracture. He was found to have a stress fracture 3 weeks ago and Dr. Alfonso Ramus placed him in a Cam Walker.  He came in today with increasing pain in the left foot with increased swelling.   Past Medical History  Diagnosis Date  . Ulcer disease     Bulbar  . Barrett's esophagus   . Hypertension   . Mastoiditis   . BPH (benign prostatic hyperplasia)   . Fracture of 5th metatarsal     Past Surgical History  Procedure Laterality Date  . Hemorrhoid surgery  2009  . Colonoscopy    . Upper gastrointestinal endoscopy    . Rotor cuff  2009    right-  . Knee arthroscopy      Right Knee  . Tonsillectomy    . Colonoscopy      Family History  Problem Relation Age of Onset  . COPD Mother   . Heart disease Father   . Healthy Sister   . Lung cancer Brother   . Heart disease Brother   . Heart disease Sister   . Prostate cancer Brother   . Healthy Son   . Healthy Son    Social History:  reports that he has never smoked. He has never used smokeless tobacco. He reports that he does not drink alcohol. His drug history is not on file.  Allergies:  Allergies  Allergen Reactions  . Ampicillin Rash     (Not in a hospital admission)  Results for orders placed during the hospital encounter of 12/07/13 (from the past 48 hour(s))  BASIC METABOLIC PANEL     Status: Abnormal   Collection Time    12/07/13  3:40 PM      Result Value Range   Sodium 142  137 - 147 mEq/L   Potassium 4.3  3.7 - 5.3 mEq/L   Chloride 104  96 - 112 mEq/L   CO2 24  19 - 32 mEq/L   Glucose, Bld 88  70 - 99 mg/dL   BUN 23  6 - 23 mg/dL   Creatinine, Ser 1.24  0.50 - 1.35 mg/dL   Calcium 9.5  8.4 - 10.5 mg/dL   GFR calc non Af Amer 60 (*) >90 mL/min   GFR calc Af Amer 69 (*) >90 mL/min   Comment: (NOTE)     The eGFR has been calculated using  the CKD EPI equation.     This calculation has not been validated in all clinical situations.     eGFR's persistently <90 mL/min signify possible Chronic Kidney     Disease.   No results found.  Review of Systems  Constitutional: Negative.   HENT: Negative.   Eyes: Negative.   Respiratory: Negative.   Cardiovascular: Negative.   Gastrointestinal: Positive for melena.  Musculoskeletal: Positive for joint pain.       Foot pain  Skin: Negative.   Neurological: Negative.   Endo/Heme/Allergies: Negative.   Psychiatric/Behavioral: Negative.     There were no vitals taken for this visit. Physical Exam  Constitutional: He is oriented to person, place, and time. He appears well-developed and well-nourished.  HENT:  Head: Normocephalic and atraumatic.  Mouth/Throat: Oropharynx is clear and moist.  Eyes: Conjunctivae are normal. Pupils are equal, round, and reactive to light.  Neck: Neck supple.  Cardiovascular: Normal rate and regular rhythm.   Respiratory: Effort normal and breath sounds normal.  GI: Soft.  Genitourinary:  Not pertinent to current symptomatology therefore not examined.  Musculoskeletal:  . Examination of his left foot reveals swelling and pain in the 5th metatarsal shaft. No malalignment noted. He has full range of motion of the other toes. Exam of the right foot reveals full range of motion without pain swelling weakness or instability.   Neurological: He is alert and oriented to person, place, and time.  Skin: Skin is warm and dry.  Psychiatric: He has a normal mood and affect. His behavior is normal.   X-RAYS: Left foot AP lateral and oblique show displaced distal shaft 5th metatarsal fracture.  Assessment Patient Active Problem List   Diagnosis Date Noted  . Fracture of 5th metatarsal   . BPH (benign prostatic hyperplasia)   . Mastoiditis   . Hypertension   . Barrett's esophagus   . Ulcer disease   . GERD (gastroesophageal reflux disease) 05/24/2012  .  HTN (hypertension) 05/24/2012  . History of colonic polyps 05/24/2012   Plan I talk to him about this in detail. Would recommend with obvious displacement that we proceed with open reduction internal fixation of this. Discussed risks benefits and possible complications of the surgery in detail and he understands this completely.He will be out of work for approximately 6-8 weeks post-op.  Cianni Manny J 12/08/2013, 10:42 AM

## 2013-12-09 ENCOUNTER — Encounter (HOSPITAL_BASED_OUTPATIENT_CLINIC_OR_DEPARTMENT_OTHER): Payer: BC Managed Care – PPO | Admitting: Anesthesiology

## 2013-12-09 ENCOUNTER — Ambulatory Visit (HOSPITAL_BASED_OUTPATIENT_CLINIC_OR_DEPARTMENT_OTHER)
Admission: RE | Admit: 2013-12-09 | Discharge: 2013-12-09 | Disposition: A | Discharge status: Home / Self Care | Payer: BC Managed Care – PPO | Source: Ambulatory Visit | Attending: Orthopedic Surgery | Admitting: Orthopedic Surgery

## 2013-12-09 ENCOUNTER — Encounter (HOSPITAL_BASED_OUTPATIENT_CLINIC_OR_DEPARTMENT_OTHER): Payer: Self-pay | Admitting: *Deleted

## 2013-12-09 ENCOUNTER — Encounter (HOSPITAL_BASED_OUTPATIENT_CLINIC_OR_DEPARTMENT_OTHER)
Admission: RE | Disposition: A | Discharge status: Home / Self Care | Payer: Self-pay | Source: Ambulatory Visit | Attending: Orthopedic Surgery

## 2013-12-09 ENCOUNTER — Ambulatory Visit (HOSPITAL_BASED_OUTPATIENT_CLINIC_OR_DEPARTMENT_OTHER): Payer: BC Managed Care – PPO | Admitting: Anesthesiology

## 2013-12-09 DIAGNOSIS — Z881 Allergy status to other antibiotic agents status: Secondary | ICD-10-CM | POA: Insufficient documentation

## 2013-12-09 DIAGNOSIS — H7090 Unspecified mastoiditis, unspecified ear: Secondary | ICD-10-CM | POA: Insufficient documentation

## 2013-12-09 DIAGNOSIS — M84376A Stress fracture, unspecified foot, initial encounter for fracture: Secondary | ICD-10-CM | POA: Insufficient documentation

## 2013-12-09 DIAGNOSIS — K219 Gastro-esophageal reflux disease without esophagitis: Secondary | ICD-10-CM | POA: Insufficient documentation

## 2013-12-09 DIAGNOSIS — I10 Essential (primary) hypertension: Secondary | ICD-10-CM | POA: Insufficient documentation

## 2013-12-09 DIAGNOSIS — S92353A Displaced fracture of fifth metatarsal bone, unspecified foot, initial encounter for closed fracture: Secondary | ICD-10-CM

## 2013-12-09 DIAGNOSIS — K2270 Barrett's esophagus without dysplasia: Secondary | ICD-10-CM | POA: Insufficient documentation

## 2013-12-09 DIAGNOSIS — N400 Enlarged prostate without lower urinary tract symptoms: Secondary | ICD-10-CM | POA: Insufficient documentation

## 2013-12-09 DIAGNOSIS — H7492 Unspecified disorder of left middle ear and mastoid: Secondary | ICD-10-CM | POA: Diagnosis present

## 2013-12-09 DIAGNOSIS — X58XXXA Exposure to other specified factors, initial encounter: Secondary | ICD-10-CM | POA: Insufficient documentation

## 2013-12-09 HISTORY — PX: ORIF TOE FRACTURE: SHX5032

## 2013-12-09 SURGERY — OPEN REDUCTION INTERNAL FIXATION (ORIF) METATARSAL (TOE) FRACTURE
Anesthesia: General | Site: Foot | Laterality: Left

## 2013-12-09 MED ORDER — FENTANYL CITRATE 0.05 MG/ML IJ SOLN
INTRAMUSCULAR | Status: AC
  Filled 2013-12-09: qty 2

## 2013-12-09 MED ORDER — PROPOFOL 10 MG/ML IV BOLUS
INTRAVENOUS | Status: DC | PRN
  Administered 2013-12-09: 20 mg via INTRAVENOUS

## 2013-12-09 MED ORDER — VANCOMYCIN HCL IN DEXTROSE 1-5 GM/200ML-% IV SOLN
1000.0000 mg | INTRAVENOUS | Status: AC
  Administered 2013-12-09: 1000 mg via INTRAVENOUS

## 2013-12-09 MED ORDER — LACTATED RINGERS IV SOLN
INTRAVENOUS | Status: DC
  Administered 2013-12-09: 20 mL/h via INTRAVENOUS

## 2013-12-09 MED ORDER — CHLORHEXIDINE GLUCONATE 4 % EX LIQD: 60.0000 mL | Freq: Once | CUTANEOUS | Status: DC

## 2013-12-09 MED ORDER — VANCOMYCIN HCL IN DEXTROSE 1-5 GM/200ML-% IV SOLN
INTRAVENOUS | Status: AC
  Filled 2013-12-09: qty 200

## 2013-12-09 MED ORDER — ONDANSETRON HCL 4 MG/2ML IJ SOLN
INTRAMUSCULAR | Status: DC | PRN
  Administered 2013-12-09: 4 mg via INTRAVENOUS

## 2013-12-09 MED ORDER — PROPOFOL INFUSION 10 MG/ML OPTIME
INTRAVENOUS | Status: DC | PRN
  Administered 2013-12-09: 50 ug/kg/min via INTRAVENOUS

## 2013-12-09 MED ORDER — MIDAZOLAM HCL 2 MG/2ML IJ SOLN
INTRAMUSCULAR | Status: AC
  Filled 2013-12-09: qty 2

## 2013-12-09 MED ORDER — MEPERIDINE HCL 25 MG/ML IJ SOLN: 6.2500 mg | INTRAMUSCULAR | Status: DC | PRN

## 2013-12-09 MED ORDER — FENTANYL CITRATE 0.05 MG/ML IJ SOLN
50.0000 ug | INTRAMUSCULAR | Status: DC | PRN
  Administered 2013-12-09: 100 ug via INTRAVENOUS

## 2013-12-09 MED ORDER — ONDANSETRON HCL 4 MG/2ML IJ SOLN: 4.0000 mg | Freq: Once | INTRAMUSCULAR | Status: DC | PRN

## 2013-12-09 MED ORDER — DEXAMETHASONE SODIUM PHOSPHATE 10 MG/ML IJ SOLN
INTRAMUSCULAR | Status: DC | PRN
  Administered 2013-12-09: 10 mg via INTRAVENOUS

## 2013-12-09 MED ORDER — HYDROCODONE-ACETAMINOPHEN 5-325 MG PO TABS: ORAL_TABLET | ORAL | Status: DC

## 2013-12-09 MED ORDER — OXYCODONE HCL 5 MG PO TABS: 5.0000 mg | ORAL_TABLET | Freq: Once | ORAL | Status: DC | PRN

## 2013-12-09 MED ORDER — FENTANYL CITRATE 0.05 MG/ML IJ SOLN
INTRAMUSCULAR | Status: AC
  Filled 2013-12-09: qty 4

## 2013-12-09 MED ORDER — HYDROMORPHONE HCL PF 1 MG/ML IJ SOLN: 0.2500 mg | INTRAMUSCULAR | Status: DC | PRN

## 2013-12-09 MED ORDER — OXYCODONE HCL 5 MG/5ML PO SOLN: 5.0000 mg | Freq: Once | ORAL | Status: DC | PRN

## 2013-12-09 MED ORDER — LIDOCAINE HCL (CARDIAC) 20 MG/ML IV SOLN
INTRAVENOUS | Status: DC | PRN
  Administered 2013-12-09: 25 mg via INTRAVENOUS

## 2013-12-09 MED ORDER — MIDAZOLAM HCL 2 MG/2ML IJ SOLN
1.0000 mg | INTRAMUSCULAR | Status: DC | PRN
  Administered 2013-12-09: 2 mg via INTRAVENOUS

## 2013-12-09 MED ORDER — PROPOFOL 10 MG/ML IV EMUL
INTRAVENOUS | Status: AC
  Filled 2013-12-09: qty 50

## 2013-12-09 SURGICAL SUPPLY — 80 items
ADH SKN CLS APL DERMABOND .7 (GAUZE/BANDAGES/DRESSINGS)
APL SKNCLS STERI-STRIP NONHPOA (GAUZE/BANDAGES/DRESSINGS)
BAG DECANTER FOR FLEXI CONT (MISCELLANEOUS) IMPLANT
BANDAGE ELASTIC 4 VELCRO ST LF (GAUZE/BANDAGES/DRESSINGS) ×2 IMPLANT
BANDAGE ELASTIC 6 VELCRO ST LF (GAUZE/BANDAGES/DRESSINGS) ×2 IMPLANT
BANDAGE ESMARK 6X9 LF (GAUZE/BANDAGES/DRESSINGS) IMPLANT
BENZOIN TINCTURE PRP APPL 2/3 (GAUZE/BANDAGES/DRESSINGS) IMPLANT
BLADE SURG 15 STRL LF DISP TIS (BLADE) ×1 IMPLANT
BLADE SURG 15 STRL SS (BLADE) ×3
BNDG CMPR 9X4 STRL LF SNTH (GAUZE/BANDAGES/DRESSINGS)
BNDG CMPR 9X6 STRL LF SNTH (GAUZE/BANDAGES/DRESSINGS)
BNDG COHESIVE 3X5 TAN STRL LF (GAUZE/BANDAGES/DRESSINGS) ×3 IMPLANT
BNDG ESMARK 4X9 LF (GAUZE/BANDAGES/DRESSINGS) IMPLANT
BNDG ESMARK 6X9 LF (GAUZE/BANDAGES/DRESSINGS)
CANISTER SUCT 1200ML W/VALVE (MISCELLANEOUS) IMPLANT
CLOSURE WOUND 1/2 X4 (GAUZE/BANDAGES/DRESSINGS)
CLOSURE WOUND 1/4X4 (GAUZE/BANDAGES/DRESSINGS)
COVER MAYO STAND STRL (DRAPES) ×1 IMPLANT
COVER TABLE BACK 60X90 (DRAPES) ×3 IMPLANT
CUFF TOURNIQUET SINGLE 18IN (TOURNIQUET CUFF) ×2 IMPLANT
CUFF TOURNIQUET SINGLE 34IN LL (TOURNIQUET CUFF) IMPLANT
DECANTER SPIKE VIAL GLASS SM (MISCELLANEOUS) IMPLANT
DERMABOND ADVANCED (GAUZE/BANDAGES/DRESSINGS)
DERMABOND ADVANCED .7 DNX12 (GAUZE/BANDAGES/DRESSINGS) IMPLANT
DRAPE EXTREMITY T 121X128X90 (DRAPE) ×3 IMPLANT
DRAPE OEC MINIVIEW 54X84 (DRAPES) ×3 IMPLANT
DRAPE U 20/CS (DRAPES) ×2 IMPLANT
DRAPE U-SHAPE 47X51 STRL (DRAPES) ×3 IMPLANT
DURAPREP 26ML APPLICATOR (WOUND CARE) ×3 IMPLANT
ELECT REM PT RETURN 9FT ADLT (ELECTROSURGICAL) ×3
ELECTRODE REM PT RTRN 9FT ADLT (ELECTROSURGICAL) ×1 IMPLANT
GAUZE XEROFORM 1X8 LF (GAUZE/BANDAGES/DRESSINGS) ×3 IMPLANT
GLOVE BIO SURGEON STRL SZ 6.5 (GLOVE) ×2 IMPLANT
GLOVE BIO SURGEON STRL SZ7 (GLOVE) ×5 IMPLANT
GLOVE BIO SURGEONS STRL SZ 6.5 (GLOVE) ×2
GLOVE BIOGEL PI IND STRL 7.0 (GLOVE) ×1 IMPLANT
GLOVE BIOGEL PI IND STRL 7.5 (GLOVE) ×1 IMPLANT
GLOVE BIOGEL PI INDICATOR 7.0 (GLOVE) ×6
GLOVE BIOGEL PI INDICATOR 7.5 (GLOVE) ×4
GLOVE SS BIOGEL STRL SZ 7.5 (GLOVE) ×1 IMPLANT
GLOVE SUPERSENSE BIOGEL SZ 7.5 (GLOVE) ×2
GOWN STRL REUS W/ TWL LRG LVL3 (GOWN DISPOSABLE) ×1 IMPLANT
GOWN STRL REUS W/ TWL XL LVL3 (GOWN DISPOSABLE) IMPLANT
GOWN STRL REUS W/TWL LRG LVL3 (GOWN DISPOSABLE) ×9
GOWN STRL REUS W/TWL XL LVL3 (GOWN DISPOSABLE) ×3
K-WIRE 9  SMOOTH .062 (WIRE) ×2 IMPLANT
NEEDLE HYPO 22GX1.5 SAFETY (NEEDLE) IMPLANT
NS IRRIG 1000ML POUR BTL (IV SOLUTION) ×3 IMPLANT
PACK BASIN DAY SURGERY FS (CUSTOM PROCEDURE TRAY) ×3 IMPLANT
PAD CAST 4YDX4 CTTN HI CHSV (CAST SUPPLIES) IMPLANT
PADDING CAST ABS 4INX4YD NS (CAST SUPPLIES) ×12
PADDING CAST ABS COTTON 4X4 ST (CAST SUPPLIES) ×1 IMPLANT
PADDING CAST COTTON 4X4 STRL (CAST SUPPLIES)
PADDING CAST COTTON 6X4 STRL (CAST SUPPLIES) ×2 IMPLANT
PENCIL BUTTON HOLSTER BLD 10FT (ELECTRODE) IMPLANT
SHEET MEDIUM DRAPE 40X70 STRL (DRAPES) IMPLANT
SLEEVE SCD COMPRESS KNEE MED (MISCELLANEOUS) IMPLANT
SPLINT FAST PLASTER 5X30 (CAST SUPPLIES) ×30
SPLINT PLASTER CAST FAST 5X30 (CAST SUPPLIES) IMPLANT
SPLINT PLASTER CAST XFAST 4X15 (CAST SUPPLIES) IMPLANT
SPLINT PLASTER XTRA FAST SET 4 (CAST SUPPLIES)
SPONGE GAUZE 4X4 12PLY (GAUZE/BANDAGES/DRESSINGS) IMPLANT
SPONGE GAUZE 4X4 12PLY STER LF (GAUZE/BANDAGES/DRESSINGS) IMPLANT
STOCKINETTE 4X48 STRL (DRAPES) IMPLANT
STOCKINETTE 6  STRL (DRAPES)
STOCKINETTE 6 STRL (DRAPES) IMPLANT
STRIP CLOSURE SKIN 1/2X4 (GAUZE/BANDAGES/DRESSINGS) IMPLANT
STRIP CLOSURE SKIN 1/4X4 (GAUZE/BANDAGES/DRESSINGS) IMPLANT
SUCTION FRAZIER TIP 10 FR DISP (SUCTIONS) ×2 IMPLANT
SUT ETHILON 4 0 PS 2 18 (SUTURE) IMPLANT
SUT PROLENE 3 0 PS 2 (SUTURE) ×2 IMPLANT
SUT VIC AB 2-0 SH 27 (SUTURE) ×3
SUT VIC AB 2-0 SH 27XBRD (SUTURE) IMPLANT
SUT VIC AB 3-0 FS2 27 (SUTURE) IMPLANT
SYR BULB 3OZ (MISCELLANEOUS) IMPLANT
SYR CONTROL 10ML LL (SYRINGE) IMPLANT
TOWEL OR 17X24 6PK STRL BLUE (TOWEL DISPOSABLE) ×6 IMPLANT
TUBE CONNECTING 20'X1/4 (TUBING) ×1
TUBE CONNECTING 20X1/4 (TUBING) ×1 IMPLANT
UNDERPAD 30X30 INCONTINENT (UNDERPADS AND DIAPERS) ×3 IMPLANT

## 2013-12-09 NOTE — Interval H&P Note (Signed)
History and Physical Interval Note:  12/09/2013 2:24 PM  Frank [name]  has presented today for surgery, with the diagnosis of LEFT FIFTH METATARSAL FRACTURE  The various methods of treatment have been discussed with the patient and family. After consideration of risks, benefits and other options for treatment, the patient has consented to  Procedure(s): LEFT OPEN REDUCTION INTERNAL FIXATION (ORIF) FIFTH METATARSAL FRACTURE (Left) as a surgical intervention .  The patient's history has been reviewed, patient examined, no change in status, stable for surgery.  I have reviewed the patient's chart and labs.  Questions were answered to the patient's satisfaction.     Elsie Saas A

## 2013-12-09 NOTE — Anesthesia Preprocedure Evaluation (Signed)
Anesthesia Evaluation  Patient identified by MRN, date of birth, ID band Patient awake    Reviewed: Allergy & Precautions, H&P , NPO status , Patient's Chart, lab work & pertinent test results  Airway Mallampati: I TM Distance: >3 FB Neck ROM: Full    Dental   Pulmonary          Cardiovascular hypertension, Pt. on medications     Neuro/Psych    GI/Hepatic GERD-  Medicated and Controlled,  Endo/Other    Renal/GU      Musculoskeletal   Abdominal   Peds  Hematology   Anesthesia Other Findings   Reproductive/Obstetrics                           Anesthesia Physical Anesthesia Plan  ASA: II  Anesthesia Plan: General   Post-op Pain Management:    Induction: Intravenous  Airway Management Planned: LMA  Additional Equipment:   Intra-op Plan:   Post-operative Plan: Extubation in OR  Informed Consent: I have reviewed the patients History and Physical, chart, labs and discussed the procedure including the risks, benefits and alternatives for the proposed anesthesia with the patient or authorized representative who has indicated his/her understanding and acceptance.     Plan Discussed with: CRNA and Surgeon  Anesthesia Plan Comments:         Anesthesia Quick Evaluation  

## 2013-12-09 NOTE — Anesthesia Postprocedure Evaluation (Signed)
Anesthesia Post Note  Patient: Frank [name]  Procedure(s) Performed: Procedure(s) (LRB): LEFT OPEN REDUCTION INTERNAL FIXATION (ORIF) FIFTH METATARSAL FRACTURE (Left)  Anesthesia type: general  Patient location: PACU  Post pain: Pain level controlled  Post assessment: Patient's Cardiovascular Status Stable  Last Vitals:  Filed Vitals:   12/09/13 1700  BP: 122/75  Pulse: 63  Temp:   Resp: 11    Post vital signs: Reviewed and stable  Level of consciousness: sedated  Complications: No apparent anesthesia complications

## 2013-12-09 NOTE — Progress Notes (Signed)
Assisted Dr. Conrad [place] with left, ultrasound guided, popliteal/  block. Side rails up, monitors on throughout procedure. See vital signs in flow sheet. Tolerated Procedure well.

## 2013-12-09 NOTE — Transfer of Care (Signed)
Immediate Anesthesia Transfer of Care Note  Patient: Frank [name]  Procedure(s) Performed: Procedure(s): LEFT OPEN REDUCTION INTERNAL FIXATION (ORIF) FIFTH METATARSAL FRACTURE (Left)  Patient Location: PACU  Anesthesia Type:MAC combined with regional for post-op pain  Level of Consciousness: awake, alert  and oriented  Airway & Oxygen Therapy: Patient Spontanous Breathing and Patient connected to face mask oxygen  Post-op Assessment: Report given to PACU RN and Post -op Vital signs reviewed and stable  Post vital signs: Reviewed and stable  Complications: No apparent anesthesia complications

## 2013-12-09 NOTE — Discharge Instructions (Signed)
°  Post Anesthesia Home Care Instructions ° °Activity: °Get plenty of rest for the remainder of the day. A responsible adult should stay with you for 24 hours following the procedure.  °For the next 24 hours, DO NOT: °-Drive a car °-Operate machinery °-Drink alcoholic beverages °-Take any medication unless instructed by your physician °-Make any legal decisions or sign important papers. ° °Meals: °Start with liquid foods such as gelatin or soup. Progress to regular foods as tolerated. Avoid greasy, spicy, heavy foods. If nausea and/or vomiting occur, drink only clear liquids until the nausea and/or vomiting subsides. Call your physician if vomiting continues. ° °Special Instructions/Symptoms: °Your throat may feel dry or sore from the anesthesia or the breathing tube placed in your throat during surgery. If this causes discomfort, gargle with warm salt water. The discomfort should disappear within 24 hours. ° °Regional Anesthesia Blocks ° °1. Numbness or the inability to move the "blocked" extremity may last from 3-48 hours after placement. The length of time depends on the medication injected and your individual response to the medication. If the numbness is not going away after 48 hours, call your surgeon. ° °2. The extremity that is blocked will need to be protected until the numbness is gone and the  Strength has returned. Because you cannot feel it, you will need to take extra care to avoid injury. Because it may be weak, you may have difficulty moving it or using it. You may not know what position it is in without looking at it while the block is in effect. ° °3. For blocks in the legs and feet, returning to weight bearing and walking needs to be done carefully. You will need to wait until the numbness is entirely gone and the strength has returned. You should be able to move your leg and foot normally before you try and bear weight or walk. You will need someone to be with you when you first try to ensure you  do not fall and possibly risk injury. ° °4. Bruising and tenderness at the needle site are common side effects and will resolve in a few days. ° °5. Persistent numbness or new problems with movement should be communicated to the surgeon or the [facility] ([phone])/ [facility] ([phone]). °

## 2013-12-09 NOTE — H&P (View-Only) (Signed)
Frank [name] is an 63 y.o. male.   Chief Complaint: left foot 5th metatarsal fracture HPI: Frank [name] is a 63 year old seen for evaluation of a left 5th metatarsal shaft fracture. He was found to have a stress fracture 3 weeks ago and Dr. Alfonso Ramus placed him in a Cam Walker.  He came in today with increasing pain in the left foot with increased swelling.   Past Medical History  Diagnosis Date  . Ulcer disease     Bulbar  . Barrett's esophagus   . Hypertension   . Mastoiditis   . BPH (benign prostatic hyperplasia)   . Fracture of 5th metatarsal     Past Surgical History  Procedure Laterality Date  . Hemorrhoid surgery  2009  . Colonoscopy    . Upper gastrointestinal endoscopy    . Rotor cuff  2009    right-  . Knee arthroscopy      Right Knee  . Tonsillectomy    . Colonoscopy      Family History  Problem Relation Age of Onset  . COPD Mother   . Heart disease Father   . Healthy Sister   . Lung cancer Brother   . Heart disease Brother   . Heart disease Sister   . Prostate cancer Brother   . Healthy Son   . Healthy Son    Social History:  reports that he has never smoked. He has never used smokeless tobacco. He reports that he does not drink alcohol. His drug history is not on file.  Allergies:  Allergies  Allergen Reactions  . Ampicillin Rash     (Not in a hospital admission)  Results for orders placed during the hospital encounter of 12/07/13 (from the past 48 hour(s))  BASIC METABOLIC PANEL     Status: Abnormal   Collection Time    12/07/13  3:40 PM      Result Value Range   Sodium 142  137 - 147 mEq/L   Potassium 4.3  3.7 - 5.3 mEq/L   Chloride 104  96 - 112 mEq/L   CO2 24  19 - 32 mEq/L   Glucose, Bld 88  70 - 99 mg/dL   BUN 23  6 - 23 mg/dL   Creatinine, Ser 1.24  0.50 - 1.35 mg/dL   Calcium 9.5  8.4 - 10.5 mg/dL   GFR calc non Af Amer 60 (*) >90 mL/min   GFR calc Af Amer 69 (*) >90 mL/min   Comment: (NOTE)     The eGFR has been calculated using  the CKD EPI equation.     This calculation has not been validated in all clinical situations.     eGFR's persistently <90 mL/min signify possible Chronic Kidney     Disease.   No results found.  Review of Systems  Constitutional: Negative.   HENT: Negative.   Eyes: Negative.   Respiratory: Negative.   Cardiovascular: Negative.   Gastrointestinal: Positive for melena.  Musculoskeletal: Positive for joint pain.       Foot pain  Skin: Negative.   Neurological: Negative.   Endo/Heme/Allergies: Negative.   Psychiatric/Behavioral: Negative.     There were no vitals taken for this visit. Physical Exam  Constitutional: He is oriented to person, place, and time. He appears well-developed and well-nourished.  HENT:  Head: Normocephalic and atraumatic.  Mouth/Throat: Oropharynx is clear and moist.  Eyes: Conjunctivae are normal. Pupils are equal, round, and reactive to light.  Neck: Neck supple.  Cardiovascular: Normal rate and regular rhythm.   Respiratory: Effort normal and breath sounds normal.  GI: Soft.  Genitourinary:  Not pertinent to current symptomatology therefore not examined.  Musculoskeletal:  . Examination of his left foot reveals swelling and pain in the 5th metatarsal shaft. No malalignment noted. He has full range of motion of the other toes. Exam of the right foot reveals full range of motion without pain swelling weakness or instability.   Neurological: He is alert and oriented to person, place, and time.  Skin: Skin is warm and dry.  Psychiatric: He has a normal mood and affect. His behavior is normal.   X-RAYS: Left foot AP lateral and oblique show displaced distal shaft 5th metatarsal fracture.  Assessment Patient Active Problem List   Diagnosis Date Noted  . Fracture of 5th metatarsal   . BPH (benign prostatic hyperplasia)   . Mastoiditis   . Hypertension   . Barrett's esophagus   . Ulcer disease   . GERD (gastroesophageal reflux disease) 05/24/2012  .  HTN (hypertension) 05/24/2012  . History of colonic polyps 05/24/2012   Plan I talk to him about this in detail. Would recommend with obvious displacement that we proceed with open reduction internal fixation of this. Discussed risks benefits and possible complications of the surgery in detail and he understands this completely.He will be out of work for approximately 6-8 weeks post-op.  Aleksander Edmiston J 12/08/2013, 10:42 AM

## 2013-12-09 NOTE — Anesthesia Procedure Notes (Signed)
Procedure Name: MAC Date/Time: 12/09/2013 3:36 PM Performed by: Elsie Saas A Pre-anesthesia Checklist: Patient identified, Emergency Drugs available, Suction available and Patient being monitored Patient Re-evaluated:Patient Re-evaluated prior to inductionOxygen Delivery Method: Simple face mask Preoxygenation: Pre-oxygenation with 100% oxygen Dental Injury: Teeth and Oropharynx as per pre-operative assessment

## 2013-12-11 NOTE — Op Note (Signed)
NAMEMITCHELL, [name]               ACCOUNT NO.:  [id number]  MEDICAL RECORD NO.:  [id number]  LOCATION:                               FACILITY:  [facility]  PHYSICIAN:  Audree Camel. Noemi Chapel, M.D. DATE OF BIRTH:  28-Apr-1951  DATE OF PROCEDURE:  12/09/2013 DATE OF DISCHARGE:  12/09/2013                              OPERATIVE REPORT   PREOPERATIVE DIAGNOSIS:  Left foot fifth metatarsal shaft fracture.  POSTOPERATIVE DIAGNOSIS:  Left foot fifth metatarsal shaft fracture.  PROCEDURE:  Open reduction and internal fixation of left foot fifth metatarsal shaft fracture.  SURGEON:  Audree Camel. Noemi Chapel, M.D.  ASSISTANT:  Frank Saras, PA-C  ANESTHESIA:  General.  OPERATIVE TIME:  45 minutes.  COMPLICATIONS:  None.  INDICATION FOR PROCEDURE:  Frank [name] is a 63 year old gentleman who sustained a left foot displaced fifth metatarsal shaft fracture approximately 2 weeks ago.  Exam and x-rays have showed a significantly displaced distal shaft fracture, and he is now to undergo ORIF of this.  DESCRIPTION OF PROCEDURE:  Frank [name] was brought into the operating room on December 09, 2013 after a popliteal block was placed and holding by Anesthesia.  He was placed on operative table in supine position.  He received antibiotics preoperatively for prophylaxis.  After being placed under general anesthesia, his left foot and leg was prepped using sterile DuraPrep and draped using sterile technique.  Time-out procedure was called and the correct left foot identified.  Initially, using C-arm for guidance, percutaneous incision was made just proximal to the base of the fifth metatarsal.  A K-wire from the cannulated 4.5 mm screw set was placed, drilled from the base of the fifth metatarsal distally in the metatarsal shaft up to the fracture site.  I attempted multiple attempts to get a closed reduction of this fracture, but this was not successful, and because of this, we proceeded with open reduction  of this distal fracture.  The leg was exsanguinated and a calf tourniquet elevated to 250 mm.  Initially, a 2-3 cm longitudinal incision made on the dorsal aspect of the distal fifth metatarsal shaft, initial exposure was made.  The underlying subcutaneous tissues were incised along with skin incision.  The fracture was exposed and then it was reduced anatomically while the K-wire was then drilled across the fracture site into the distal metatarsal shaft.  I did not feel that a screw would give adequate fixation due to the fact that the distal fracture was significantly osteopenic and thus, I elected to leave the 1.6 K-wire in position as the fixation of the fracture holding and then reduced in an anatomic position.  AP and lateral fluoroscopic x-rays confirmed this as well.  At this point, the K-wire was jade and cut just outside the skin approximately, and the wounds were copiously irrigated and closed with 2- 0 Vicryl and 4-0 Monocryl.  Sterile dressings were applied and a short- leg splint, and tourniquet was released and the patient awakened and taken to recovery room in stable condition.  Needle, sponge counts were correct x2 at the end the case.  FOLLOWUP CARE:  Frank [name] will be followed as an outpatient on Norco for pain.  Seen  back in the office in a week for wound check and followup.     Faelynn Wynder A. Noemi Chapel, M.D.     RAW/MEDQ  D:  12/09/2013  T:  12/10/2013  Job:  [id number]

## 2013-12-12 ENCOUNTER — Other Ambulatory Visit: Payer: Self-pay | Admitting: Physician Assistant

## 2013-12-12 ENCOUNTER — Encounter (HOSPITAL_BASED_OUTPATIENT_CLINIC_OR_DEPARTMENT_OTHER): Payer: Self-pay | Admitting: Orthopedic Surgery

## 2013-12-12 LAB — POCT HEMOGLOBIN-HEMACUE: Hemoglobin: 13.6 g/dL (ref 13.0–17.0)

## 2014-02-14 ENCOUNTER — Ambulatory Visit (HOSPITAL_COMMUNITY)
Admission: RE | Admit: 2014-02-14 | Discharge: 2014-02-14 | Disposition: A | Discharge status: Home / Self Care | Payer: BC Managed Care – PPO | Source: Ambulatory Visit | Attending: Orthopedic Surgery | Admitting: Orthopedic Surgery

## 2014-02-14 DIAGNOSIS — R262 Difficulty in walking, not elsewhere classified: Secondary | ICD-10-CM | POA: Insufficient documentation

## 2014-02-14 DIAGNOSIS — M25476 Effusion, unspecified foot: Secondary | ICD-10-CM | POA: Insufficient documentation

## 2014-02-14 DIAGNOSIS — IMO0001 Reserved for inherently not codable concepts without codable children: Secondary | ICD-10-CM | POA: Insufficient documentation

## 2014-02-14 DIAGNOSIS — M25473 Effusion, unspecified ankle: Secondary | ICD-10-CM | POA: Insufficient documentation

## 2014-02-14 DIAGNOSIS — M79672 Pain in left foot: Secondary | ICD-10-CM | POA: Insufficient documentation

## 2014-02-14 DIAGNOSIS — M25579 Pain in unspecified ankle and joints of unspecified foot: Secondary | ICD-10-CM | POA: Insufficient documentation

## 2014-02-14 DIAGNOSIS — I10 Essential (primary) hypertension: Secondary | ICD-10-CM | POA: Insufficient documentation

## 2014-02-14 NOTE — Evaluation (Signed)
Physical Therapy Evaluation  Patient Details  Name: Frank [name] MRN: [id number] Date of Birth: 03-17-1951  Today's Date: 02/14/2014 Time: 0930-1015 PT Time Calculation (min): 45 min   Charges : 1 evaluation            Visit#: 1 of 8  Re-eval: 03/16/14 Assessment Diagnosis: left ORIF 5th metatarsal  Surgical Date: 12/09/13 Next MD Visit: 02/20/2014  Prior Therapy: no   Authorization: BCBS     Authorization Time Period:    Authorization Visit#:   of     Past Medical History:  Past Medical History  Diagnosis Date  . Ulcer disease     Bulbar  . Barrett's esophagus   . Hypertension   . Mastoiditis   . BPH (benign prostatic hyperplasia)   . Fracture of 5th metatarsal    Past Surgical History:  Past Surgical History  Procedure Laterality Date  . Hemorrhoid surgery  2009  . Colonoscopy    . Upper gastrointestinal endoscopy    . Rotor cuff  2009    right-  . Knee arthroscopy      Right Knee  . Tonsillectomy    . Colonoscopy    . Orif toe fracture Left 12/09/2013    Procedure: LEFT OPEN REDUCTION INTERNAL FIXATION (ORIF) FIFTH METATARSAL FRACTURE;  Surgeon: Lorn Junes, MD;  Location: [facility];  Service: Orthopedics;  Laterality: Left;    Subjective Symptoms/Limitations Symptoms: presents in CAM boot left foot, deneis pain, does report discomfort , pateint has progressed from NWB using  a walker to knee walker/scooter  to [facility] in CAM boot  Pertinent History: left 5th metatarsal ORIF 12/09/2013 , 63 year old , fx due to stress fracture, pateint unable to provide actual onset , dr reported to pt 85-90% healed , trialed wearing boot for 2 weeks prior to surgery  Patient Stated Goals: 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 , works as Estate manager/land agent  Pain Assessment Currently in Pain?: No/denies  Precautions/Restrictions     Balance Screening    Prior Functioning  Prior Function Level of Independence: Independent with basic ADLs Vocation: Full  time employment Comments: standing, squattiong, walking , lifting , Estate manager/land agent , currently off work   Cognition/Observation Observation/Other Assessments Observations: NWB high longitudinal med arch , incison healed   Sensation/Coordination/Flexibility/Functional Tests Sensation Light Touch: Appears Intact Hot/Cold: Appears Intact Functional Tests Functional Tests: FOTO 50 score   Assessment LLE AROM (degrees) Left Ankle Dorsiflexion: 10 Left Ankle Plantar Flexion: 30 Left Ankle Inversion: 45 Left Ankle Eversion: 10 LLE Strength Left Ankle Dorsiflexion: 5/5 Left Ankle Inversion: 4/5 Left Ankle Eversion: 3+/5  Exercise/Treatments   Ankle Exercises - Supine T-Band: 4 way 25X green       Physical Therapy Assessment and Plan PT Assessment and Plan Clinical Impression Statement: 63 year old  s/p fifth left metatarsal ORIF 12/09/2013, he is now walking in cam boot and has ankle ROM WNL, he has dr appt next week and possible progression to ambulation outside of boot . patient did not bring PT orders but verbal orders from dr office for physical therapy with weight bearing in CAM boot. patient  has already started ankle t band  and basic AROM. He will be progressed to standing ankle strength and stability once  orders indicate.  Pt will benefit from skilled therapeutic intervention in order to improve on the following deficits: Decreased mobility;Decreased balance;Decreased strength Rehab Potential: Good PT Frequency: Min 2X/week PT Duration: 4 weeks PT Treatment/Interventions: Gait  training;Stair training;Functional mobility training;Therapeutic activities;Therapeutic exercise;Manual techniques;Patient/family education;Neuromuscular re-education;Balance training PT Plan: progresss standing ankle stability and balance assessement once dr allows ambulation outside of boot ,    Goals PT Short Term Goals Time to Complete Short Term Goals: 4 weeks PT Short Term Goal 1: return to  pain free ambulation during daily routine without boot wear  PT Short Term Goal 2: progress into balance single leg stance activites outside CAM boot once allowed by dr to demonstrate left anle stability 10 seconds unsupported to reduce injury risk once returned to work  PT Short Term Goal 3: pain free recirpocal stair use with handrail   Problem List Patient Active Problem List   Diagnosis Date Noted  . Left foot pain 02/14/2014  . Fracture of 5th metatarsal   . BPH (benign prostatic hyperplasia)   . Mastoiditis   . Hypertension   . Barrett's esophagus   . Ulcer disease   . GERD (gastroesophageal reflux disease) 05/24/2012  . HTN (hypertension) 05/24/2012  . History of colonic polyps 05/24/2012    PT Plan of Care Consulted and Agree with Plan of Care: Patient  GP Functional Assessment Tool Used: foto 50 score  other   Nakiah Osgood 02/14/2014, 11:55 AM  Physician Documentation Your signature is required to indicate approval of the treatment plan as stated above.  Please sign and either send electronically or make a copy of this report for your files and return this physician signed original.   Please mark one 1.__approve of plan  2. ___approve of plan with the following conditions.   ______________________________                                                          _____________________ Physician Signature                                                                                                             Date

## 2014-02-16 ENCOUNTER — Ambulatory Visit (HOSPITAL_COMMUNITY): Payer: BC Managed Care – PPO | Admitting: Physical Therapy

## 2014-02-22 ENCOUNTER — Ambulatory Visit (HOSPITAL_COMMUNITY)
Admission: RE | Admit: 2014-02-22 | Discharge: 2014-02-22 | Disposition: A | Discharge status: Home / Self Care | Payer: BC Managed Care – PPO | Source: Ambulatory Visit | Attending: Internal Medicine | Admitting: Internal Medicine

## 2014-02-22 DIAGNOSIS — M79672 Pain in left foot: Secondary | ICD-10-CM

## 2014-02-22 NOTE — Progress Notes (Signed)
Physical Therapy Treatment Patient Details  Name: Frank [name] MRN: [id number] Date of Birth: [birth year]  Today's Date: 02/22/2014 Time: 0855-0930 PT Time Calculation (min): 35 min Charges: Manual therapy [phone], Therapeutic Exercise 910-930  Visit#: 3 of 8  Re-eval: 03/16/14 Assessment Diagnosis: left ORIF 5th metatarsal  Surgical Date: 12/09/13 Next MD Visit: 02/20/2014  Prior Therapy: no   Authorization: BCBS   Authorization Time Period:    Authorization Visit#:   of     Subjective: Symptoms/Limitations Symptoms: Patint states that he has felt good being out of the boot but that he feels his foot and lower leg were a little swollen after working in the garden over the last couple of days in prparation for selling his home.  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 10x 3sec) Other Stretch: hip flexor stretch to 16" box, Groin stretch to 16" box.  Ankle Exercises - Standing Other Standing Ankle Exercises: walking rotational hip excursion x 20ft, big reach walk x49ft Manual Therapy Manual Therapy: Joint mobilization Joint Mobilization: grade 3-4 calcaneal eversion mobilizations, Grade 3-4 ttalocrural AP mobs, Cupid superior/inferior mobilzations grade 3  Physical Therapy Assessment and Plan PT Assessment and Plan Clinical Impression Statement: 63 y/o patient s/p left metatarsal ORIF 12/09/13. Patient is now walking WBAT in walking shoes. Patient has returned to performing most ADLs including working in his garden. Patient continues to ambulate on the outside of  his foot. Patient states good adhearance to ankle HEP. Today patient ankle was further examined revealing significant limitation in calcaneal eversion and dorsiflexion resulting in difficulty attaining full hip extension ot toe off in gait. other contribuing factors to history of ankle and knee pain include patient's limited hip/pelvic rotation during gait placing increased torque on the patient's knee. Patient tolerate all standing exercises well, recommending performing standing balance exercises and stanidng strengthenign exercises as able.  Pt will benefit from skilled therapeutic intervention in order to improve on the following deficits: Decreased mobility;Decreased balance;Decreased strength Rehab Potential: Good PT Frequency: Min 2X/week PT Duration: 4 weeks PT Treatment/Interventions: Gait training;Stair training;Functional mobility training;Therapeutic activities;Therapeutic exercise;Manual techniques;Patient/family education;Neuromuscular re-education;Balance training PT Plan: Progresss standing ankle stability and balance assessement once dr allows ambulation outside of boot.    Goals PT Short Term Goals Time to Complete Short Term Goals: 4 weeks PT Short Term Goal 1: return to pain free ambulation during daily routine without boot wear  PT Short Term Goal 1 - Progress: Progressing toward goal PT Short Term Goal 2: progress into balance single leg stance activites outside CAM boot once allowed by dr to demonstrate left anle stability 10 seconds unsupported to reduce injury risk once returned to work  PT Short Term Goal 2 - Progress: Progressing toward goal PT Short Term Goal 3: pain free recirpocal stair use with handrail  PT Short Term Goal 3 - Progress: Progressing toward goal  Problem List Patient Active Problem List   Diagnosis Date Noted  . Left foot pain 02/14/2014  . Fracture of 5th metatarsal   . BPH (benign prostatic hyperplasia)   . Mastoiditis   . Hypertension   .  Barrett's esophagus   . Ulcer disease   . GERD (gastroesophageal reflux disease) 05/24/2012  . HTN (hypertension) 05/24/2012  . History of colonic polyps 05/24/2012    PT Plan of Care PT Home Exercise Plan: Added 3 way plantar flexor stretch Consulted and Agree with Plan of Care: Patient  GP Functional Assessment Tool Used: foto 50 score  other   Frank [name] R PT DPT 02/22/2014, 12:14 PM

## 2014-02-24 ENCOUNTER — Ambulatory Visit (HOSPITAL_COMMUNITY)
Admission: RE | Admit: 2014-02-24 | Discharge: 2014-02-24 | Disposition: A | Discharge status: Home / Self Care | Payer: BC Managed Care – PPO | Source: Ambulatory Visit | Attending: Orthopedic Surgery | Admitting: Orthopedic Surgery

## 2014-02-24 DIAGNOSIS — M79672 Pain in left foot: Secondary | ICD-10-CM

## 2014-02-24 NOTE — Progress Notes (Signed)
Physical Therapy Treatment Patient Details  Name: Frank [name] MRN: [id number] Date of Birth: 01/11/1951  Today's Date: 02/24/2014 Time: 0930-1015 PT Time Calculation (min): 45 min Charges: 875-643 manual therapy, [id number] Therapeutic Exercise.   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 10x 3sec) Other Stretch: 3way knee driverr 32R Other Stretch: 3 way hamstring stretch,  ITB Stretch to 6" box   Ankle Exercises - Standing Other Standing Ankle Exercises: IR flamengo 10x Other Standing Ankle Exercises: Forward lunge 10x to 6" box; pain with lateral lunging in Rt knee  Standing Toe touch BAPS grade 2   Manual Therapy Manual Therapy: Joint mobilization Joint Mobilization: grade 3-4 calcaneal eversion mobilizations, Grade 3-4 ttalocrural AP mobs, Cupid superior/inferior mobilzations grade 3, grade 4-5 talocrural distraction  Physical Therapy Assessment and Plan PT Assessment and Plan Clinical Impression Statement: 63 y/o patient s/p left metatarsal ORIF 12/09/13. Patient is now walking WBAT in walking shoes. Patient has returned to performing most ADLs including working in his garden. Patient continues to ambulate  on the outside of his foot. Patient states good adhearance to ankle HEP. Today patient ankle was further examined revealing significant limitation in calcaneal eversion and dorsiflexion resulting in difficulty attaining full hip extension ot toe off in gait. Patient Rt knee pain attributed to limited ITB mobility. other contribuing factors to history of ankle and knee pain include patient's limited hip/pelvic rotation during gait placing increased torque on the patient's knee, these appread to be cause by patient's limited lateral hamstring mobility and decreaseed subtaylor mobility. Patient tolerate all standing exercises well, patient was unable to perform 3way calf stretch due to limited lateral hamstring mobility. Patient was able to tolerate stanidg BAPS mobility exercises with only toe touch for balance. Also introduced staning knee drives to 16" box to increase DF in [place].  Pt will benefit from skilled therapeutic intervention in order to improve on the following deficits: Decreased mobility;Decreased balance;Decreased strength Rehab Potential: Good PT Frequency: Min 2X/week PT Duration: 4 weeks PT Treatment/Interventions: Gait training;Stair training;Functional mobility training;Therapeutic activities;Therapeutic exercise;Manual techniques;Patient/family education;Neuromuscular re-education;Balance training PT Plan: Continue standing BAPS and progress to SL BAPS as tolerated, continue to utilize ITB stretchign to progress mobility. Pateitn responded well to flamengo exercise but contiues to require cuing.     Goals    Problem List Patient Active Problem List   Diagnosis Date Noted  . Left foot pain 02/14/2014  . Fracture of 5th metatarsal   . BPH (benign prostatic hyperplasia)   . Mastoiditis   . Hypertension   . Barrett's esophagus   . Ulcer disease   . GERD (gastroesophageal reflux disease) 05/24/2012  . HTN (hypertension) 05/24/2012  . History of colonic polyps 05/24/2012    PT  - End  of Session Activity Tolerance: Patient tolerated treatment well PT Plan of Care PT Home Exercise Plan: Added 3 way plantar flexor and 3 way hamstring stretches in addition to band exercises   GP Functional Assessment Tool Used: foto 50 score  other   Delmon Andrada R DPT PT 02/24/2014, 10:24 AM

## 2014-03-01 ENCOUNTER — Ambulatory Visit (HOSPITAL_COMMUNITY)
Admission: RE | Admit: 2014-03-01 | Discharge: 2014-03-01 | Disposition: A | Discharge status: Home / Self Care | Payer: BC Managed Care – PPO | Source: Ambulatory Visit | Attending: Internal Medicine | Admitting: Internal Medicine

## 2014-03-01 DIAGNOSIS — I10 Essential (primary) hypertension: Secondary | ICD-10-CM | POA: Insufficient documentation

## 2014-03-01 DIAGNOSIS — M79672 Pain in left foot: Secondary | ICD-10-CM

## 2014-03-01 DIAGNOSIS — IMO0001 Reserved for inherently not codable concepts without codable children: Secondary | ICD-10-CM | POA: Insufficient documentation

## 2014-03-01 DIAGNOSIS — M25476 Effusion, unspecified foot: Secondary | ICD-10-CM | POA: Insufficient documentation

## 2014-03-01 DIAGNOSIS — M25579 Pain in unspecified ankle and joints of unspecified foot: Secondary | ICD-10-CM | POA: Insufficient documentation

## 2014-03-01 DIAGNOSIS — M25473 Effusion, unspecified ankle: Secondary | ICD-10-CM | POA: Insufficient documentation

## 2014-03-01 DIAGNOSIS — R262 Difficulty in walking, not elsewhere classified: Secondary | ICD-10-CM | POA: Insufficient documentation

## 2014-03-01 NOTE — Progress Notes (Signed)
Physical Therapy Treatment Patient Details  Name: Frank [name] MRN: [id number] Date of Birth: 10-07-1951  Today's Date: 03/01/2014 Time: 0930-1015 PT Time Calculation (min): 45 min Charges: 465-035 manual, [phone] therapeutic exercise  Visit#: 5 of 8  Re-eval: 03/16/14 Assessment Diagnosis: left ORIF 5th metatarsal  Surgical Date: 12/09/13 Next MD Visit: 02/20/2014  Prior Therapy: no   Authorization: BCBS   Authorization Time Period:    Authorization Visit#:   of     Subjective: Symptoms/Limitations Symptoms: Patient states his Lt foot is feeling better though some of his exercises are causing his right knee to pop and have pain. patient states his Right foot is feeling sore ancd pain ful today as well.  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 10x 3sec) Other Stretch: 3way knee driverr 46F Other Stretch: 3 way hamstring stretch, ITB Stretch, 2way groin stretch to 14" box   Ankle Exercises - Standing Other Standing Ankle Exercises: 2 way big reach walk Other Standing Ankle Exercises: Walking hip excursion    Manual Therapy Manual Therapy: Joint mobilization Joint Mobilization: grade 3-4 calcaneal eversion mobilizations, Grade 3-4 ttalocrural AP mobs, Cupid superior/inferior mobilzations grade 3, Cuboid inferior and superior glides  Physical Therapy Assessment and Plan PT Assessment and Plan Clinical Impression Statement: 63 y/o patient s/p left metatarsal ORIF 12/09/13. Patient is now walking WBAT in walking shoes. Patient has returned to performing most ADLs including working in his garden.  Patient continues to ambulate on the outside of his foot. Patient states good adhearance to ankle HEP. Patient displays improving hip extension and improving ankle dorsiflexion Rom though he continues to display limited strength and limited pelvic rotation resulting in limted stride length and abnormal gait. Patient Rt knee pain attributed to limited ITB/hamstring mobility. other contribuing factors to history of ankle and knee pain include patient's limited hip/pelvic rotation during gait placing increased torque on the patient's knee, these appeared to be cause by patient's limited lateral hamstring mobility and decreased subtalor mobility. Patient tolerate all standing exercises well, patient was unable to perform 3way calf stretch due to limited lateral hamstring mobility. Patient was able to tolerate standing BAPS mobility exercises with only toe touch for balance. Also introduced standing knee drives to 16" box to increase DF in [place]. Patient displays low tolerance for lunging activities secondary to knee pain.  Pt will benefit from skilled therapeutic intervention in order to improve on the following deficits: Decreased mobility;Decreased balance;Decreased strength Rehab Potential: Good PT Frequency: Min 2X/week PT Duration: 4 weeks PT Treatment/Interventions: Gait training;Stair training;Functional mobility training;Therapeutic activities;Therapeutic exercise;Manual techniques;Patient/family education;Neuromuscular re-education;Balance training PT Plan: Continue standing BAPS and progress to SL BAPS as tolerated, Progress pelvc rotation mobility (via big reach walks, and 3D hip drives) during gait to improve gait mechanics and increase stride mechanics.     Goals Home Exercise Program Pt/caregiver will Perform Home Exercise Program: For increased ROM;Independently PT Goal: Perform Home Exercise Program - Progress: Progressing toward goal PT Short Term Goals PT Short Term Goal 1: return to pain  free ambulation during daily routine without boot wear  PT Short Term Goal 2: progress  into balance single leg stance activites outside CAM boot once allowed by dr to demonstrate left anle stability 10 seconds unsupported to reduce injury risk once returned to work  PT Short Term Goal 2 - Progress: Progressing toward goal PT Short Term Goal 3: pain free recirpocal stair use with handrail  PT Short Term Goal 3 - Progress: Progressing toward goal  Problem List Patient Active Problem List   Diagnosis Date Noted  . Left foot pain 02/14/2014  . Fracture of 5th metatarsal   . BPH (benign prostatic hyperplasia)   . Mastoiditis   . Hypertension   . Barrett's esophagus   . Ulcer disease   . GERD (gastroesophageal reflux disease) 05/24/2012  . HTN (hypertension) 05/24/2012  . History of colonic polyps 05/24/2012    PT - End of Session Activity Tolerance: Patient tolerated treatment well PT Plan of Care PT Home Exercise Plan: Added 3 way plantar flexor and 3 way hamstring stretches in addition to band exercises and recently added big reach walks  GP    Permelia Bamba R PT 03/01/2014, 11:08 AM

## 2014-03-03 ENCOUNTER — Ambulatory Visit (HOSPITAL_COMMUNITY): Payer: BC Managed Care – PPO | Admitting: Physical Therapy

## 2014-03-08 ENCOUNTER — Ambulatory Visit (HOSPITAL_COMMUNITY)
Admission: RE | Admit: 2014-03-08 | Discharge: 2014-03-08 | Disposition: A | Discharge status: Home / Self Care | Payer: BC Managed Care – PPO | Source: Ambulatory Visit | Attending: Internal Medicine | Admitting: Internal Medicine

## 2014-03-08 NOTE — Progress Notes (Signed)
Physical Therapy Treatment Patient Details  Name: Frank [name] MRN: [id number] Date of Birth: 1951-09-04  Today's Date: 03/08/2014 Time: 0865-7846 PT Time Calculation (min): 39 min Charges: Therex x 96'(2952-8413) Manual x 10'(1005-1015)  Visit#: 6 of 8  Re-eval: 03/16/14  Authorization: BCBS     Subjective: Symptoms/Limitations Symptoms: Pt reports HEP compliance. Pain Assessment Currently in Pain?: No/denies   Exercise/Treatments Ankle Stretches Gastroc Stretch: 2 reps;30 seconds;Limitations Gastroc Stretch Limitations: 3 way Other Stretch: 2way groin stretch to 14" box 5x 10" Ankle Exercises - Standing BAPS: Level 2;10 reps SLS: 3x10" LLE Other Standing Ankle Exercises: 2 way big reach walk Other Standing Ankle Exercises: Rocker board x 2' A/P R/L  Manual Therapy Joint Mobilization: Manual techniques to improve scar mobility and decrease localized swelling.  Physical Therapy Assessment and Plan PT Assessment and Plan Clinical Impression Statement: Pt completes therex well after initial cueing and demonstration. Pt requires multimodal cueing to isolate ankle movement with BAPS board. Significant swelling noted in left lateral foot and ankle. Manual techniques to improve scar mobility and decrease localized swelling.   Encouraged to increase frequency of cryotherapy at home as well as looking into compression garments. Pt reports no increase in pain throughout session. Pt will benefit from skilled therapeutic intervention in order to improve on the following deficits: Decreased mobility;Decreased balance;Decreased strength Rehab Potential: Good PT Frequency: Min 2X/week PT Duration: 4 weeks PT Treatment/Interventions: Gait training;Stair training;Functional mobility training;Therapeutic activities;Therapeutic exercise;Manual techniques;Patient/family education;Neuromuscular re-education;Balance training PT Plan: Continue to progress pelvc rotation mobility (via big reach  walks, and 3D hip drives) during gait to improve gait mechanics and increase stride mechanics.     Problem List Patient Active Problem List   Diagnosis Date Noted  . Left foot pain 02/14/2014  . Fracture of 5th metatarsal   . BPH (benign prostatic hyperplasia)   . Mastoiditis   . Hypertension   . Barrett's esophagus   . Ulcer disease   . GERD (gastroesophageal reflux disease) 05/24/2012  . HTN (hypertension) 05/24/2012  . History of colonic polyps 05/24/2012    PT - End of Session Activity Tolerance: Patient tolerated treatment well General Behavior During Therapy: [facility] for tasks assessed/performed   Rachelle Hora, PTA  03/08/2014, 10:48 AM

## 2014-03-10 ENCOUNTER — Ambulatory Visit (HOSPITAL_COMMUNITY)
Admission: RE | Admit: 2014-03-10 | Discharge: 2014-03-10 | Disposition: A | Discharge status: Home / Self Care | Payer: BC Managed Care – PPO | Source: Ambulatory Visit | Attending: Internal Medicine | Admitting: Internal Medicine

## 2014-03-10 NOTE — Progress Notes (Addendum)
Physical Therapy Treatment Patient Details  Name: Frank [name] MRN: [id number] Date of Birth: [birth year]  Today's Date: 03/10/2014 Time: 4696-2952 PT Time Calculation (min): 40 min Charges: Manual: 841-324 TherEx: [phone] Visit#: 7 of 8  Re-eval: 03/16/14   Authorization: BCBS   Authorization Visit#:  7 of   8  Subjective: Symptoms/Limitations Symptoms: Pt reports HEP compliance. Pain Assessment Currently in Pain?: Yes Pain Score: 2   Exercise/Treatments Ankle Stretches Gastroc Stretch: 2 reps;30 seconds;Limitations Gastroc Stretch Limitations: 3 way Other Stretch: 3way knee driver to 14" box with talar mobilizaion to increase dorsiflexion  10x Other Stretch: piriformis stetch and ITB stretches 10x 3sec   Ankle Exercises - Standing SLS: SL balance reach matrix with pillow case for sliding balance as needed 10x  Physical Therapy Assessment and Plan PT Assessment and Plan Clinical Impression Statement: Patient's foot contineus to display limited ankle dorsiflexion and limited calcaneal eversion (hough much improved) resulting in excessive forces through the 5th metatarsal. Following mobilizations and mobilizations with movemnt patient displays improved ROM. This session focused on stanidng single leg balanace with muliplanar NWB LE reaches (patient requires occasional toe touch to prevent loss of balance). Patient contineus to have bilateral knee pain with lunging and squatting activites limiting foot and ankle rehabilitation. knee pain attributed to limited ankle and hip pain including limited piriformis/glut mobility, ITB tightness, and glut weakness. Next session to focus on improving hip and knee strength/stability to improve ankle rehabilitation. Patient is making good progress with mproved gait but continnued limitations attributed to decreased hip mobility.       Pt will benefit from skilled therapeutic intervention in order to improve on the following deficits: Decreased  mobility;Decreased balance;Decreased strength Rehab Potential: Good PT Treatment/Interventions: Gait training;Stair training;Functional mobility training;Therapeutic activities;Therapeutic exercise;Manual techniques;Patient/family education;Neuromuscular re-education;Balance training PT Plan: Add piriformis stretch, ITB stretch, supine glut strengthening exercises.     Goals PT Short Term Goals PT Short Term Goal 1 - Progress: Progressing toward goal PT Short Term Goal 2 - Progress: Progressing toward goal PT Short Term Goal 3 - Progress: Progressing toward goal  Problem List Patient Active Problem List   Diagnosis Date Noted  . Left foot pain 02/14/2014  . Fracture of 5th metatarsal   . BPH (benign prostatic hyperplasia)   . Mastoiditis   . Hypertension   . Barrett's esophagus   . Ulcer disease   . GERD (gastroesophageal reflux disease) 05/24/2012  . HTN (hypertension) 05/24/2012  . History of colonic polyps 05/24/2012    PT - End of Session Activity Tolerance: Patient tolerated treatment well General Behavior During Therapy: [facility] for tasks assessed/performed PT Plan of Care Consulted and Agree with Plan of Care: Patient  GP    Frank [name] PT DPT 03/10/2014, 11:21 AM

## 2014-03-15 ENCOUNTER — Encounter: Payer: Self-pay | Admitting: Podiatry

## 2014-03-15 ENCOUNTER — Ambulatory Visit (HOSPITAL_COMMUNITY)
Admission: RE | Admit: 2014-03-15 | Discharge: 2014-03-15 | Disposition: A | Discharge status: Home / Self Care | Payer: BC Managed Care – PPO | Source: Ambulatory Visit | Attending: Internal Medicine | Admitting: Internal Medicine

## 2014-03-15 ENCOUNTER — Ambulatory Visit (INDEPENDENT_AMBULATORY_CARE_PROVIDER_SITE_OTHER): Payer: BC Managed Care – PPO | Admitting: Podiatry

## 2014-03-15 VITALS — BP 152/87 | HR 64 | Resp 16

## 2014-03-15 DIAGNOSIS — L600 Ingrowing nail: Secondary | ICD-10-CM

## 2014-03-15 DIAGNOSIS — M79672 Pain in left foot: Secondary | ICD-10-CM

## 2014-03-15 NOTE — Progress Notes (Signed)
   Subjective:    Patient ID: Frank [name], male    DOB: 02/11/1951, 63 y.o.   MRN: [id number]  HPI Comments: "I have an ingrown toenail"  Patient c/o painful 1st toe left, medial border, for a few days. The area is slightly red. His wife clipped the toenail out and it feels some better.  Toe Pain       Review of Systems  HENT: Positive for hearing loss.   Musculoskeletal: Positive for arthralgias.  Skin:       Change in toenails   All other systems reviewed and are negative.      Objective:   Physical Exam        Assessment & Plan:

## 2014-03-15 NOTE — Patient Instructions (Signed)

## 2014-03-15 NOTE — Progress Notes (Signed)
Physical Therapy Treatment Patient Details  Name: Frank [name] MRN: [id number] Date of Birth: [birth year]  Today's Date: 03/15/2014 Time: 0935-1020 PT Time Calculation (min): 45 min Charges: Manual therapy [phone], Therapeutic Exercise 1001-1020 Visit#: 8 of 8  Re-eval: 03/16/14 Assessment Diagnosis: left ORIF 5th metatarsal  Surgical Date: 12/09/13 Next MD Visit: 02/20/2014  Prior Therapy: no   Authorization: BCBS   Authorization Time Period:    Authorization Visit#: 8 of 20   Subjective: Symptoms/Limitations Symptoms: Patient reports increaseed soreess along dorsum or foot and 5th metatarsal. Patient returns to work Sunday for 5 hours.  Pain Assessment Currently in Pain?: Yes Pain Score: 2  Pain Location: Foot Pain Orientation: Left  Precautions/Restrictions     Exercise/Treatments Ankle Stretches Gastroc Stretch:  (standing 10x 3seconds 3 ways. ) Other Stretch: Seated ankle over knee anterior tib muscle stretch 3x30sec   Ankle Exercises - Standing SLS: 3 way flemengo 5x at wall Rocker Board: 2 minutes (lateral) Other Standing Ankle Exercises: frontal plane and transvers plane walking hip excurions 38f Other Standing Ankle Exercises: backwards walking 582f 10x 8" step up  Physical Therapy Assessment and Plan PT Assessment and Plan Clinical Impression Statement: Patient's foot continues to display limited ankle dorsiflexion and limited calcaneal eversion (though much improved) resulting in excessive forces through the 5th metatarsal. Today patint notes increased pain in dorsum of the foot that is attributed to decreased anterior compartment musculature with toe extensors particularly limited. Following stretching of anterior compartment muscles patient displays improved symptoms. This session focused on standing single leg balance with multiplanes NWB LE reaches (patient requires occasional toe touch to prevent loss of balance). Patient continues to have bilateral knee  pain with lunging, step up, and squatting activities, despite displaying appropriate mechanics when cued, limiting foot and ankle rehabilitation. Patient was educated on possible need for further diagnosis of knee pain by physician. Knee pain partially attributed to limited ankle and hip pain including limited piriformis/glut mobility, ITB tightness, and glut weakness. Patient is making good progress with improved gait but continued limitations attributed to decreased hip mobility resulting in limited lateral hip displacement and llimited pelvic rotation during gait.  Pt will benefit from skilled therapeutic intervention in order to improve on the following deficits: Decreased mobility;Decreased balance;Decreased strength;Abnormal gait Rehab Potential: Good PT Frequency: Min 2X/week PT Duration: 4 weeks PT Treatment/Interventions: Gait training;Stair training;Functional mobility training;Therapeutic activities;Therapeutic exercise;Manual techniques;Patient/family education;Neuromuscular re-education;Balance training PT Plan: Next session to focus on improving hip and knee strength/stability to improve ankle rehabilitation  and progression of SL balance exercises. Continue piriformis and ITB stretching.     Goals PT Short Term Goals PT Short Term Goal 1 - Progress: Progressing toward goal PT Short Term Goal 2 - Progress: Met PT Short Term Goal 3 - Progress: Progressing toward goal  Problem List Patient Active Problem List   Diagnosis Date Noted  . Left foot pain 02/14/2014  . Fracture of 5th metatarsal   . BPH (benign prostatic hyperplasia)   . Mastoiditis   . Hypertension   . Barrett's esophagus   . Ulcer disease   . GERD (gastroesophageal reflux disease) 05/24/2012  . HTN (hypertension) 05/24/2012  . History of colonic polyps 05/24/2012    PT - End of Session Activity Tolerance: Patient tolerated treatment well General [facility]  CaSuzette Battiestewitt PT DPT 03/15/2014, 10:36 AM

## 2014-03-15 NOTE — Progress Notes (Signed)
Subjective:     Patient ID: Frank [name], male   DOB: 01-19-1951, 63 y.o.   MRN: [id number]  Toe Pain    patient points to big toe left foot states this nail has been bothering me and we've tried to trim it and soak it without relief. It's been this way for a few weeks   Review of Systems  All other systems reviewed and are negative.      Objective:   Physical Exam  Nursing note and vitals reviewed. Constitutional: He is oriented to person, place, and time.  Cardiovascular: Intact distal pulses.   Musculoskeletal: Normal range of motion.  Neurological: He is oriented to person, place, and time.  Skin: Skin is warm.   neurovascular status intact with muscle strength adequate and range of motion subtalar midtarsal joint within normal limits. Left hallux medial border is incurvated and sore when pressed with patient having difficulty being able to wear shoe gear comfortably with slight redness noted     Assessment:     Ingrown toenail deformity left hallux medial border    Plan:     H&P performed. I recommended removal of the corner patient wants this done and I explained risk. He wants procedure and today I infiltrated 60 mg Xylocaine Marcaine mixture remove the medial border exposed the matrix and applied phenol 3 applications 30 seconds followed by alcohol lavaged and sterile dressing. Gave instructions on soaks

## 2014-03-23 ENCOUNTER — Ambulatory Visit (HOSPITAL_COMMUNITY): Payer: BC Managed Care – PPO | Admitting: Physical Therapy

## 2014-03-28 ENCOUNTER — Telehealth (HOSPITAL_COMMUNITY): Payer: Self-pay

## 2014-03-29 ENCOUNTER — Encounter: Payer: Self-pay | Admitting: Emergency Medicine

## 2014-03-29 ENCOUNTER — Ambulatory Visit (INDEPENDENT_AMBULATORY_CARE_PROVIDER_SITE_OTHER): Payer: BC Managed Care – PPO | Admitting: Emergency Medicine

## 2014-03-29 ENCOUNTER — Ambulatory Visit (HOSPITAL_COMMUNITY): Payer: BC Managed Care – PPO | Admitting: Physical Therapy

## 2014-03-29 VITALS — BP 129/86 | Ht 71.0 in | Wt 180.0 lb

## 2014-03-29 DIAGNOSIS — S92353A Displaced fracture of fifth metatarsal bone, unspecified foot, initial encounter for closed fracture: Secondary | ICD-10-CM

## 2014-03-29 DIAGNOSIS — S92309A Fracture of unspecified metatarsal bone(s), unspecified foot, initial encounter for closed fracture: Secondary | ICD-10-CM

## 2014-03-29 NOTE — Assessment & Plan Note (Signed)
Custom orthotics may the office today.

## 2014-03-29 NOTE — Progress Notes (Signed)
Patient ID: Frank [name], male   DOB: [birth year], 63 y.o.   MRN: [id number] 63 year old male presents for evaluation production of custom orthotics. As a referral from Dr. Noemi Chapel. He a recent stress fracture of the left fifth metatarsal bone. This fracture required pinning. He is been a steel insert which he is to use until the custom orthotics are made. Currently he has no complaints of pain. 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  Pertinent past medical history: Hypertension  Social history: Nonsmoker, no alcohol  Review of systems: As per history of present illness otherwise all systems negative  Examination: BP 129/86  Ht 5\' 11"  (1.803 m)  Wt 180 lb (81.647 kg)  BMI 25.12 kg/m2 Well-developed well-nourished 63 year old male awake alert and oriented no acute distress Bilateral feet: Postsurgical scarring lateral aspect of the left foot. Excessive supination with bilateral bunionette deformity. Cavus foot.  Patient was fitted for a : standard, cushioned, semi-rigid orthotic. The orthotic was heated and afterward the patient stood on the orthotic blank positioned on the orthotic stand. The patient was positioned in subtalar neutral position and 10 degrees of ankle dorsiflexion in a weight bearing stance. After completion of molding, a stable base was applied to the orthotic blank. The blank was ground to a stable position for weight bearing. Size:11 Base: Blue EVA Posting: None Additional orthotic padding: None  Greater than 45 minutes of time was spent in obtaining history evaluating patient, physical examination gait examination in addition to production of custom orthotics today. He was evaluated both pre-and post orthotic production. His excessive since supination was improved with the orthotics.

## 2014-03-31 ENCOUNTER — Ambulatory Visit (HOSPITAL_COMMUNITY): Payer: BC Managed Care – PPO | Admitting: Physical Therapy

## 2014-04-20 ENCOUNTER — Telehealth (HOSPITAL_COMMUNITY): Payer: Self-pay

## 2014-04-20 NOTE — Telephone Encounter (Signed)
Date: 04/19/14  Called patient regarding absence from therapy since 03-15-14. Patient is to return call and make additional appts or be discharged from therapy.  Ailene Ravel, OTR/L,CBIS

## 2014-05-03 NOTE — Addendum Note (Signed)
Encounter addended by: Leia Alf, PT on: 05/03/2014  3:13 PM<BR>     Documentation filed: Episodes

## 2014-05-03 NOTE — Progress Notes (Signed)
Discharge Patient Details  Name: Frank [name] MRN: [id number] Date of Birth: 06-29-1951  Today's Date: 05/03/2014  Patient discharged from therapy as patient determined further therapy was no longer necessary.  [name] R Siena Poehler 05/03/2014, 3:00 PM

## 2014-05-03 NOTE — Addendum Note (Signed)
Encounter addended by: Leia Alf, PT on: 05/03/2014  3:05 PM<BR>     Documentation filed: Notes Section, Letters

## 2014-08-02 ENCOUNTER — Ambulatory Visit: Payer: BC Managed Care – PPO | Admitting: Podiatry

## 2014-08-02 ENCOUNTER — Encounter: Payer: Self-pay | Admitting: Podiatry

## 2014-08-02 VITALS — BP 147/91 | HR 63 | Resp 16

## 2014-08-02 DIAGNOSIS — L03039 Cellulitis of unspecified toe: Secondary | ICD-10-CM

## 2014-08-02 DIAGNOSIS — L600 Ingrowing nail: Secondary | ICD-10-CM

## 2014-08-02 NOTE — Progress Notes (Signed)
Subjective:     Patient ID: Frank [name], male   DOB: November 08, 1951, 62 y.o.   MRN: [id number]  HPI patient presents stating I have damaged big toenail second toenail my left foot and the inside corner of my right big toenail is damaged area states that he dropped a board on the left one is been working excessively on both   Review of Systems     Objective:   Physical Exam Neurovascular status intact muscle strength adequate with lifted hallux and second nail left that are damaged and right hallux lateral border that is draining and painful    Assessment:     Severe paronychia infection left hallux paronychia infection of the right hallux lateral border and nail disease second left    Plan:     H&P and reviewed condition. I do think that we need to remove the nails of the left first and second and the right is can require corner removal and I did explain paronychia and what to do. I infiltrated with 180 mg of Xylocaine Marcaine I then removed the entire hallux nail left drain both the medial and lateral side debris did necrotic tissue and applied sterile dressing second nail was removed for temporary nail removal left and that the right hallux remove the lateral corner removed all proud flesh abscess tissue to allowed channel for drainage and then the rest the nail became loose and it's went ahead and took that off but there was no infection on the remaining area. Gave instructions on soaks

## 2014-08-02 NOTE — Patient Instructions (Signed)

## 2014-08-03 ENCOUNTER — Ambulatory Visit: Payer: BC Managed Care – PPO | Admitting: Podiatry

## 2015-03-27 ENCOUNTER — Emergency Department (HOSPITAL_COMMUNITY)
Admission: EM | Admit: 2015-03-27 | Discharge: 2015-03-27 | Disposition: A | Discharge status: Home / Self Care | Payer: BLUE CROSS/BLUE SHIELD | Attending: Emergency Medicine | Admitting: Emergency Medicine

## 2015-03-27 ENCOUNTER — Emergency Department (HOSPITAL_COMMUNITY): Payer: BLUE CROSS/BLUE SHIELD

## 2015-03-27 ENCOUNTER — Encounter (HOSPITAL_COMMUNITY): Payer: Self-pay

## 2015-03-27 DIAGNOSIS — I10 Essential (primary) hypertension: Secondary | ICD-10-CM | POA: Diagnosis not present

## 2015-03-27 DIAGNOSIS — N390 Urinary tract infection, site not specified: Secondary | ICD-10-CM | POA: Insufficient documentation

## 2015-03-27 DIAGNOSIS — Z872 Personal history of diseases of the skin and subcutaneous tissue: Secondary | ICD-10-CM | POA: Insufficient documentation

## 2015-03-27 DIAGNOSIS — Z79899 Other long term (current) drug therapy: Secondary | ICD-10-CM | POA: Diagnosis not present

## 2015-03-27 DIAGNOSIS — Z8781 Personal history of (healed) traumatic fracture: Secondary | ICD-10-CM | POA: Insufficient documentation

## 2015-03-27 DIAGNOSIS — Z8669 Personal history of other diseases of the nervous system and sense organs: Secondary | ICD-10-CM | POA: Diagnosis not present

## 2015-03-27 DIAGNOSIS — Z8719 Personal history of other diseases of the digestive system: Secondary | ICD-10-CM | POA: Diagnosis not present

## 2015-03-27 DIAGNOSIS — R509 Fever, unspecified: Secondary | ICD-10-CM

## 2015-03-27 DIAGNOSIS — M791 Myalgia: Secondary | ICD-10-CM | POA: Diagnosis not present

## 2015-03-27 DIAGNOSIS — R05 Cough: Secondary | ICD-10-CM | POA: Diagnosis not present

## 2015-03-27 DIAGNOSIS — Z87438 Personal history of other diseases of male genital organs: Secondary | ICD-10-CM | POA: Diagnosis not present

## 2015-03-27 LAB — URINALYSIS, ROUTINE W REFLEX MICROSCOPIC
Bilirubin Urine: NEGATIVE
Glucose, UA: NEGATIVE mg/dL
Ketones, ur: NEGATIVE mg/dL
Nitrite: POSITIVE — AB
Protein, ur: 30 mg/dL — AB
Specific Gravity, Urine: 1.025 (ref 1.005–1.030)
Urobilinogen, UA: 0.2 mg/dL (ref 0.0–1.0)
pH: 5.5 (ref 5.0–8.0)

## 2015-03-27 LAB — URINE MICROSCOPIC-ADD ON

## 2015-03-27 MED ORDER — ACETAMINOPHEN 500 MG PO TABS
1000.0000 mg | ORAL_TABLET | Freq: Once | ORAL | Status: AC
  Administered 2015-03-27: 1000 mg via ORAL
  Filled 2015-03-27: qty 2

## 2015-03-27 MED ORDER — CEPHALEXIN 500 MG PO CAPS
500.0000 mg | ORAL_CAPSULE | Freq: Once | ORAL | Status: AC
  Administered 2015-03-27: 500 mg via ORAL
  Filled 2015-03-27: qty 1

## 2015-03-27 MED ORDER — CEPHALEXIN 500 MG PO CAPS: 500.0000 mg | ORAL_CAPSULE | Freq: Four times a day (QID) | ORAL | Status: DC

## 2015-03-27 NOTE — ED Notes (Signed)
Patient states fever started 4 days ago. PCP prescribed tamaflu. Patient states granddaughter was diagnosed with flu last week.

## 2015-03-27 NOTE — Discharge Instructions (Signed)
We have sent your urine for culture. If we need to change your antibiotic someone will call you.

## 2015-03-27 NOTE — ED Provider Notes (Signed)
CSN: [id number]     Arrival date & time 03/27/15  1839 History   First MD Initiated Contact with Patient 03/27/15 2048     Chief Complaint  Patient presents with  . Fever     (Consider location/radiation/quality/duration/timing/severity/associated sxs/prior Treatment) Patient is a 64 y.o. male presenting with fever. The history is provided by the patient.  Fever Max temp prior to arrival:  104 Temp source:  Oral Onset quality:  Gradual Duration:  4 days Timing:  Intermittent Progression:  Worsening Chronicity:  New Worsened by:  Nothing tried Associated symptoms: chills, cough, dysuria and myalgias    Frank [name] is a 64 y.o. male who presents to the ED with fever, chills and flu like symptoms that started 4 days ago and have gotten worse. He reports that his grand daughter was with them last week and she tested positive for influenza. He went to the doctor but tested negative but they started him on Tamiflu since the child had flu.   Past Medical History  Diagnosis Date  . Ulcer disease     Bulbar  . Barrett's esophagus   . Hypertension   . Mastoiditis   . BPH (benign prostatic hyperplasia)   . Fracture of 5th metatarsal    Past Surgical History  Procedure Laterality Date  . Hemorrhoid surgery  2009  . Colonoscopy    . Upper gastrointestinal endoscopy    . Rotor cuff  2009    right-  . Knee arthroscopy      Right Knee  . Tonsillectomy    . Colonoscopy    . Orif toe fracture Left 12/09/2013    Procedure: LEFT OPEN REDUCTION INTERNAL FIXATION (ORIF) FIFTH METATARSAL FRACTURE;  Surgeon: Lorn Junes, MD;  Location: [place];  Service: Orthopedics;  Laterality: Left;   Family History  Problem Relation Age of Onset  . COPD Mother   . Heart disease Father   . Healthy Sister   . Lung cancer Brother   . Heart disease Brother   . Heart disease Sister   . Prostate cancer Brother   . Healthy Son   . Healthy Son    History  Substance Use Topics   . Smoking status: Never Smoker   . Smokeless tobacco: Never Used  . Alcohol Use: No    Review of Systems  Constitutional: Positive for fever and chills.  Respiratory: Positive for cough.   Genitourinary: Positive for dysuria.  Musculoskeletal: Positive for myalgias.  all other systems negative    Allergies  Ampicillin  Home Medications   Prior to Admission medications   Medication Sig Start Date End Date Taking? Authorizing Provider  amLODipine (NORVASC) 5 MG tablet Take 5 mg by mouth daily.    Historical Provider, MD  calcium carbonate (OS-CAL) 600 MG TABS Take 600 mg by mouth 2 (two) times daily with a meal.    Historical Provider, MD  fish oil-omega-3 fatty acids 1000 MG capsule Take 1 g by mouth daily.    Historical Provider, MD  Ginger, Zingiber officinalis, (GINGER ROOT PO) Take by mouth daily.    Historical Provider, MD  HYDROcodone-acetaminophen (NORCO) 5-325 MG per tablet 1-2 tablets every 4-6 hrs as needed for pain 12/09/13   Kirstin Shepperson, PA-C  losartan (COZAAR) 100 MG tablet Take 100 mg by mouth daily.    Historical Provider, MD  Multiple Vitamin (MULTIVITAMIN) capsule Take 1 capsule by mouth daily.    Historical Provider, MD  OMEPRAZOLE PO Take 20 mg  by mouth.    Historical Provider, MD   BP 131/74 mmHg  Pulse 87  Temp(Src) 101.9 F (38.8 C) (Oral)  Resp 22  Ht 5\' 11"  (1.803 m)  Wt 187 lb (84.823 kg)  BMI 26.09 kg/m2  SpO2 98% Physical Exam  Constitutional: He is oriented to person, place, and time. He appears well-developed and well-nourished.  HENT:  Head: Normocephalic.  Eyes: Conjunctivae and EOM are normal.  Neck: Neck supple.  Cardiovascular: Normal rate and regular rhythm.   Pulmonary/Chest: Effort normal. He has no wheezes. Rhonchi: occasional in the LLL.  Abdominal: Soft. Bowel sounds are normal. There is no tenderness. There is no CVA tenderness.  Musculoskeletal: Normal range of motion.  Neurological: He is alert and oriented to person,  place, and time. No cranial nerve deficit.  Skin: Skin is warm and dry.  Psychiatric: He has a normal mood and affect. His behavior is normal.  Nursing note and vitals reviewed.   ED Course  Procedures (including critical care time) Results for orders placed or performed during the hospital encounter of 03/27/15 (from the past 24 hour(s))  Urinalysis, Routine w reflex microscopic     Status: Abnormal   Collection Time: 03/27/15 10:21 PM  Result Value Ref Range   Color, Urine YELLOW YELLOW   APPearance HAZY (A) CLEAR   Specific Gravity, Urine 1.025 1.005 - 1.030   pH 5.5 5.0 - 8.0   Glucose, UA NEGATIVE NEGATIVE mg/dL   Hgb urine dipstick LARGE (A) NEGATIVE   Bilirubin Urine NEGATIVE NEGATIVE   Ketones, ur NEGATIVE NEGATIVE mg/dL   Protein, ur 30 (A) NEGATIVE mg/dL   Urobilinogen, UA 0.2 0.0 - 1.0 mg/dL   Nitrite POSITIVE (A) NEGATIVE   Leukocytes, UA MODERATE (A) NEGATIVE  Urine microscopic-add on     Status: Abnormal   Collection Time: 03/27/15 10:21 PM  Result Value Ref Range   Squamous Epithelial / LPF FEW (A) RARE   WBC, UA TOO NUMEROUS TO COUNT <3 WBC/hpf   RBC / HPF 11-20 <3 RBC/hpf   Bacteria, UA MANY (A) RARE     Imaging Review Dg Chest 2 View  03/27/2015   CLINICAL DATA:  Four day history of fever  EXAM: CHEST  2 VIEW  COMPARISON:  February 25, 2008  FINDINGS: There is no edema or consolidation. The heart size and pulmonary vascularity are normal. No adenopathy. No bone lesions.  IMPRESSION: No edema or consolidation.   Electronically Signed   By: Lowella Grip III M.D.   On: 03/27/2015 21:49    MDM  64 y.o. male with fever, chills and flu like symptoms x 4 days. Will treat his UTI with Keflex and he will follow up with his PCP. Urine sent for culture. Discussed with the patient and his wife clinical, x-ray and lab findings and plan of care. All questioned fully answered. He will return here if any problems arise.   UTI     [place], NP 03/29/15  1275  Milton Ferguson, MD 04/02/15 1537

## 2015-03-30 LAB — URINE CULTURE: Colony Count: >=100000

## 2015-04-04 ENCOUNTER — Telehealth (HOSPITAL_BASED_OUTPATIENT_CLINIC_OR_DEPARTMENT_OTHER): Payer: Self-pay | Admitting: Emergency Medicine

## 2015-04-04 NOTE — Telephone Encounter (Signed)
Post ED Visit - Positive Culture Follow-up  Culture report reviewed by antimicrobial stewardship pharmacist: []  Wes Mogadore, Pharm.D., BCPS [x]  Heide Guile, Pharm.D., BCPS []  Alycia Rossetti, Pharm.D., BCPS []  Colon, [place].D., BCPS, AAHIVP []  Legrand Como, Pharm.D., BCPS, AAHIVP []  Isac Sarna, Pharm.D., BCPS  Positive urine culture E. Coli Treated with cephalexin, organism sensitive to the same and no further patient follow-up is required at this time.  Hazle Nordmann 04/04/2015, 11:56 AM

## 2015-12-20 DIAGNOSIS — H903 Sensorineural hearing loss, bilateral: Secondary | ICD-10-CM | POA: Diagnosis not present

## 2015-12-20 DIAGNOSIS — H95192 Other disorders following mastoidectomy, left ear: Secondary | ICD-10-CM | POA: Diagnosis not present

## 2015-12-20 DIAGNOSIS — H6532 Chronic mucoid otitis media, left ear: Secondary | ICD-10-CM | POA: Diagnosis not present

## 2015-12-20 DIAGNOSIS — H6983 Other specified disorders of Eustachian tube, bilateral: Secondary | ICD-10-CM | POA: Diagnosis not present

## 2015-12-20 DIAGNOSIS — H95122 Granulation of postmastoidectomy cavity, left ear: Secondary | ICD-10-CM | POA: Diagnosis not present

## 2015-12-25 DIAGNOSIS — H95192 Other disorders following mastoidectomy, left ear: Secondary | ICD-10-CM | POA: Diagnosis not present

## 2015-12-25 DIAGNOSIS — H903 Sensorineural hearing loss, bilateral: Secondary | ICD-10-CM | POA: Diagnosis not present

## 2015-12-25 DIAGNOSIS — M1711 Unilateral primary osteoarthritis, right knee: Secondary | ICD-10-CM | POA: Diagnosis not present

## 2015-12-25 DIAGNOSIS — H6983 Other specified disorders of Eustachian tube, bilateral: Secondary | ICD-10-CM | POA: Diagnosis not present

## 2015-12-25 DIAGNOSIS — H95122 Granulation of postmastoidectomy cavity, left ear: Secondary | ICD-10-CM | POA: Diagnosis not present

## 2016-01-16 DIAGNOSIS — H903 Sensorineural hearing loss, bilateral: Secondary | ICD-10-CM | POA: Diagnosis not present

## 2016-01-16 DIAGNOSIS — H6983 Other specified disorders of Eustachian tube, bilateral: Secondary | ICD-10-CM | POA: Diagnosis not present

## 2016-01-16 DIAGNOSIS — H95192 Other disorders following mastoidectomy, left ear: Secondary | ICD-10-CM | POA: Diagnosis not present

## 2016-01-21 DIAGNOSIS — Z125 Encounter for screening for malignant neoplasm of prostate: Secondary | ICD-10-CM | POA: Diagnosis not present

## 2016-01-21 DIAGNOSIS — R7301 Impaired fasting glucose: Secondary | ICD-10-CM | POA: Diagnosis not present

## 2016-01-21 DIAGNOSIS — Z79899 Other long term (current) drug therapy: Secondary | ICD-10-CM | POA: Diagnosis not present

## 2016-01-21 DIAGNOSIS — K219 Gastro-esophageal reflux disease without esophagitis: Secondary | ICD-10-CM | POA: Diagnosis not present

## 2016-01-21 DIAGNOSIS — I10 Essential (primary) hypertension: Secondary | ICD-10-CM | POA: Diagnosis not present

## 2016-01-21 DIAGNOSIS — E785 Hyperlipidemia, unspecified: Secondary | ICD-10-CM | POA: Diagnosis not present

## 2016-01-28 DIAGNOSIS — R7301 Impaired fasting glucose: Secondary | ICD-10-CM | POA: Diagnosis not present

## 2016-01-28 DIAGNOSIS — Z23 Encounter for immunization: Secondary | ICD-10-CM | POA: Diagnosis not present

## 2016-01-28 DIAGNOSIS — Z0001 Encounter for general adult medical examination with abnormal findings: Secondary | ICD-10-CM | POA: Diagnosis not present

## 2016-01-28 DIAGNOSIS — I10 Essential (primary) hypertension: Secondary | ICD-10-CM | POA: Diagnosis not present

## 2016-01-28 DIAGNOSIS — Z6829 Body mass index (BMI) 29.0-29.9, adult: Secondary | ICD-10-CM | POA: Diagnosis not present

## 2016-03-11 ENCOUNTER — Encounter (INDEPENDENT_AMBULATORY_CARE_PROVIDER_SITE_OTHER): Payer: Self-pay | Admitting: *Deleted

## 2016-03-27 ENCOUNTER — Other Ambulatory Visit (INDEPENDENT_AMBULATORY_CARE_PROVIDER_SITE_OTHER): Payer: Self-pay | Admitting: *Deleted

## 2016-03-27 DIAGNOSIS — Z860100 Personal history of colon polyps, unspecified: Secondary | ICD-10-CM

## 2016-06-02 ENCOUNTER — Telehealth (INDEPENDENT_AMBULATORY_CARE_PROVIDER_SITE_OTHER): Payer: Self-pay | Admitting: *Deleted

## 2016-06-02 ENCOUNTER — Encounter (INDEPENDENT_AMBULATORY_CARE_PROVIDER_SITE_OTHER): Payer: Self-pay | Admitting: *Deleted

## 2016-06-02 ENCOUNTER — Other Ambulatory Visit (INDEPENDENT_AMBULATORY_CARE_PROVIDER_SITE_OTHER): Payer: Self-pay | Admitting: *Deleted

## 2016-06-02 NOTE — Telephone Encounter (Signed)
Patient needs trilyte 

## 2016-06-02 NOTE — Telephone Encounter (Signed)
Referring MD/PCP: fagan   Procedure: tcs  Reason/Indication:  Hx polyps  Has patient had this procedure before?  Yes, 2012 -- epic  If so, when, by whom and where?    Is there a family history of colon cancer?  no  Who?  What age when diagnosed?    Is patient diabetic?   no      Does patient have prosthetic heart valve or mechanical valve?  no  Do you have a pacemaker?  no  Has patient ever had endocarditis? no  Has patient had joint replacement within last 12 months?  no  Does patient tend to be constipated or take laxatives? no  Does patient have a history of alcohol/drug use?  no  Is patient on Coumadin, Plavix and/or Aspirin? no  Medications: see epic  Allergies: see epic  Medication Adjustment:   Procedure date & time: 07/03/16 at 930

## 2016-06-04 NOTE — Telephone Encounter (Signed)
agree

## 2016-06-05 MED ORDER — PEG 3350-KCL-NA BICARB-NACL 420 G PO SOLR: 4000.0000 mL | Freq: Once | ORAL | Status: DC

## 2016-06-27 ENCOUNTER — Encounter (INDEPENDENT_AMBULATORY_CARE_PROVIDER_SITE_OTHER): Payer: Self-pay | Admitting: *Deleted

## 2016-07-28 ENCOUNTER — Telehealth (INDEPENDENT_AMBULATORY_CARE_PROVIDER_SITE_OTHER): Payer: Self-pay | Admitting: *Deleted

## 2016-07-28 DIAGNOSIS — I10 Essential (primary) hypertension: Secondary | ICD-10-CM | POA: Diagnosis not present

## 2016-07-28 DIAGNOSIS — B353 Tinea pedis: Secondary | ICD-10-CM | POA: Diagnosis not present

## 2016-07-28 NOTE — Telephone Encounter (Signed)
Referring MD/PCP: fagan   Procedure: tcs  Reason/Indication:  Hx polyps  Has patient had this procedure before?  Yes, 2012 -- epic  If so, when, by whom and where?    Is there a family history of colon cancer?  no  Who?  What age when diagnosed?    Is patient diabetic?   no      Does patient have prosthetic heart valve or mechanical valve?  no  Do you have a pacemaker?  no  Has patient ever had endocarditis? no  Has patient had joint replacement within last 12 months?  no  Does patient tend to be constipated or take laxatives? no  Does patient have a history of alcohol/drug use?  no  Is patient on Coumadin, Plavix and/or Aspirin? no  Medications: see epic  Allergies: see epic  Medication Adjustment:   Procedure date & time: 08/27/16 at 1200

## 2016-07-29 NOTE — Telephone Encounter (Signed)
agree

## 2016-08-27 ENCOUNTER — Encounter (HOSPITAL_COMMUNITY)
Admission: RE | Disposition: A | Discharge status: Home / Self Care | Payer: Self-pay | Source: Ambulatory Visit | Attending: Internal Medicine

## 2016-08-27 ENCOUNTER — Ambulatory Visit (HOSPITAL_COMMUNITY)
Admission: RE | Admit: 2016-08-27 | Discharge: 2016-08-27 | Disposition: A | Discharge status: Home / Self Care | Payer: PPO | Source: Ambulatory Visit | Attending: Internal Medicine | Admitting: Internal Medicine

## 2016-08-27 ENCOUNTER — Encounter (HOSPITAL_COMMUNITY): Payer: Self-pay | Admitting: *Deleted

## 2016-08-27 DIAGNOSIS — E7800 Pure hypercholesterolemia, unspecified: Secondary | ICD-10-CM | POA: Diagnosis not present

## 2016-08-27 DIAGNOSIS — K219 Gastro-esophageal reflux disease without esophagitis: Secondary | ICD-10-CM | POA: Insufficient documentation

## 2016-08-27 DIAGNOSIS — K644 Residual hemorrhoidal skin tags: Secondary | ICD-10-CM | POA: Diagnosis not present

## 2016-08-27 DIAGNOSIS — D120 Benign neoplasm of cecum: Secondary | ICD-10-CM | POA: Diagnosis not present

## 2016-08-27 DIAGNOSIS — Z86010 Personal history of colonic polyps: Secondary | ICD-10-CM | POA: Diagnosis not present

## 2016-08-27 DIAGNOSIS — Z1211 Encounter for screening for malignant neoplasm of colon: Secondary | ICD-10-CM | POA: Insufficient documentation

## 2016-08-27 DIAGNOSIS — K5730 Diverticulosis of large intestine without perforation or abscess without bleeding: Secondary | ICD-10-CM | POA: Diagnosis not present

## 2016-08-27 DIAGNOSIS — Z09 Encounter for follow-up examination after completed treatment for conditions other than malignant neoplasm: Secondary | ICD-10-CM | POA: Diagnosis not present

## 2016-08-27 DIAGNOSIS — I10 Essential (primary) hypertension: Secondary | ICD-10-CM | POA: Diagnosis not present

## 2016-08-27 DIAGNOSIS — D122 Benign neoplasm of ascending colon: Secondary | ICD-10-CM | POA: Diagnosis not present

## 2016-08-27 HISTORY — PX: POLYPECTOMY: SHX5525

## 2016-08-27 HISTORY — DX: Gastro-esophageal reflux disease without esophagitis: K21.9

## 2016-08-27 HISTORY — DX: Pure hypercholesterolemia, unspecified: E78.00

## 2016-08-27 HISTORY — PX: COLONOSCOPY: SHX5424

## 2016-08-27 SURGERY — COLONOSCOPY
Anesthesia: Moderate Sedation

## 2016-08-27 MED ORDER — MIDAZOLAM HCL 5 MG/5ML IJ SOLN
INTRAMUSCULAR | Status: AC
  Filled 2016-08-27: qty 10

## 2016-08-27 MED ORDER — MEPERIDINE HCL 50 MG/ML IJ SOLN
INTRAMUSCULAR | Status: DC | PRN
  Administered 2016-08-27 (×2): 25 mg via INTRAVENOUS

## 2016-08-27 MED ORDER — STERILE WATER FOR IRRIGATION IR SOLN
Status: DC | PRN
  Administered 2016-08-27: 09:00:00

## 2016-08-27 MED ORDER — SODIUM CHLORIDE 0.9 % IV SOLN
INTRAVENOUS | Status: DC
  Administered 2016-08-27: 08:00:00 via INTRAVENOUS

## 2016-08-27 MED ORDER — MEPERIDINE HCL 50 MG/ML IJ SOLN
INTRAMUSCULAR | Status: AC
  Filled 2016-08-27: qty 1

## 2016-08-27 MED ORDER — MIDAZOLAM HCL 5 MG/5ML IJ SOLN
INTRAMUSCULAR | Status: DC | PRN
  Administered 2016-08-27: 1 mg via INTRAVENOUS
  Administered 2016-08-27 (×2): 2 mg via INTRAVENOUS
  Administered 2016-08-27: 1 mg via INTRAVENOUS

## 2016-08-27 NOTE — Op Note (Signed)
[facility] Patient Name: Frank [name] Procedure Date: 08/27/2016 8:16 AM MRN: [id number] Date of Birth: 1951/07/23 Attending MD: Hildred Laser , MD CSN: [id number] Age: 65 Admit Type: Outpatient Procedure:                Colonoscopy Indications:              High risk colon cancer surveillance: Personal                            history of colonic polyps Providers:                Hildred Laser, MD, Otis Peak B. Sharon Seller, RN, Isabella Stalling, Technician Referring MD:             Asencion Noble, MD Medicines:                Meperidine 50 mg IV, Midazolam 6 mg IV Complications:            No immediate complications. Estimated Blood Loss:     Estimated blood loss was minimal. Procedure:                Pre-Anesthesia Assessment:                           - Prior to the procedure, a History and Physical                            was performed, and patient medications and                            allergies were reviewed. The patient's tolerance of                            previous anesthesia was also reviewed. The risks                            and benefits of the procedure and the sedation                            options and risks were discussed with the patient.                            All questions were answered, and informed consent                            was obtained. Prior Anticoagulants: The patient                            last took previous NSAID medication 2 days prior to                            the procedure. ASA Grade Assessment: II - A patient  with mild systemic disease. After reviewing the                            risks and benefits, the patient was deemed in                            satisfactory condition to undergo the procedure.                           After obtaining informed consent, the colonoscope                            was passed under direct vision. Throughout the        procedure, the patient's blood pressure, pulse, and                            oxygen saturations were monitored continuously. The                            [id number] scope was introduced through                            the anus and advanced to the the cecum, identified                            by appendiceal orifice and ileocecal valve. The                            colonoscopy was performed without difficulty. The                            patient tolerated the procedure well. The quality                            of the bowel preparation was adequate. The                            ileocecal valve, appendiceal orifice, and rectum                            were photographed. Scope In: 8:44:24 AM Scope Out: 9:10:50 AM Scope Withdrawal Time: 0 hours 19 minutes 53 seconds  Total Procedure Duration: 0 hours 26 minutes 26 seconds  Findings:      Two sessile polyps were found in the cecum. The polyps were small in       size. These were biopsied with a cold forceps for histology. The       pathology specimen was placed into Bottle Number 1.      A 5 mm polyp was found in the ascending colon. The polyp was sessile.       The polyp was removed with a cold snare. Resection and retrieval were       complete. The pathology specimen was placed into Bottle Number 1.      Multiple small and large-mouthed diverticula were found in the entire  colon.      External hemorrhoids were found during retroflexion. The hemorrhoids       were small. Impression:               - Two small polyps in the cecum. Biopsied.                           - One 5 mm polyp in the ascending colon, removed                            with a cold snare. Resected and retrieved.                           - Diverticulosis in the entire examined colon.                           - External hemorrhoids. Moderate Sedation:      Moderate (conscious) sedation was administered by the endoscopy nurse        and supervised by the endoscopist. The following parameters were       monitored: oxygen saturation, heart rate, blood pressure, CO2       capnography and response to care. Total physician intraservice time was       33 minutes. Recommendation:           - Patient has a contact number available for                            emergencies. The signs and symptoms of potential                            delayed complications were discussed with the                            patient. Return to normal activities tomorrow.                            Written discharge instructions were provided to the                            patient.                           - High fiber diet today.                           - Continue present medications.                           - Await pathology results.                           - Repeat colonoscopy in 5 years for surveillance. Procedure Code(s):        --- Professional ---                           [id number], Colonoscopy, flexible; with removal of  tumor(s), polyp(s), or other lesion(s) by snare                            technique                           45380, 59, Colonoscopy, flexible; with biopsy,                            single or multiple                           99152, Moderate sedation services provided by the                            same physician or other qualified health care                            professional performing the diagnostic or                            therapeutic service that the sedation supports,                            requiring the presence of an independent trained                            observer to assist in the monitoring of the                            patient's level of consciousness and physiological                            status; initial 15 minutes of intraservice time,                            patient age 51 years or older                           [phone], Moderate  sedation services; each additional                            15 minutes intraservice time Diagnosis Code(s):        --- Professional ---                           Z86.010, Personal history of colonic polyps                           D12.0, Benign neoplasm of cecum                           D12.2, Benign neoplasm of ascending colon                           K64.4, Residual hemorrhoidal skin tags  K57.30, Diverticulosis of large intestine without                            perforation or abscess without bleeding CPT copyright 2016 American Medical Association. All rights reserved. The codes documented in this report are preliminary and upon coder review may  be revised to meet current compliance requirements. Hildred Laser, MD Hildred Laser, MD 08/27/2016 9:22:31 AM This report has been signed electronically. Number of Addenda: 0

## 2016-08-27 NOTE — Discharge Instructions (Signed)
Resume usual medications. High fiber diet. No driving for 24 hours. Physician will call with biopsy results.  Colonoscopy, Care After Refer to this sheet in the next few weeks. These instructions provide you with information on caring for yourself after your procedure. Your health care provider may also give you more specific instructions. Your treatment has been planned according to current medical practices, but problems sometimes occur. Call your health care provider if you have any problems or questions after your procedure. WHAT TO EXPECT AFTER THE PROCEDURE  After your procedure, it is typical to have the following:  A small amount of blood in your stool.  Moderate amounts of gas and mild abdominal cramping or bloating. HOME CARE INSTRUCTIONS  Do not drive, operate machinery, or sign important documents for 24 hours.  You may shower and resume your regular physical activities, but move at a slower pace for the first 24 hours.  Take frequent rest periods for the first 24 hours.  Walk around or put a warm pack on your abdomen to help reduce abdominal cramping and bloating.  Drink enough fluids to keep your urine clear or pale yellow.  You may resume your normal diet as instructed by your health care provider. Avoid heavy or fried foods that are hard to digest.  Avoid drinking alcohol for 24 hours or as instructed by your health care provider.  Only take over-the-counter or prescription medicines as directed by your health care provider.  If a tissue sample (biopsy) was taken during your procedure:  Do not take aspirin or blood thinners for 7 days, or as instructed by your health care provider.  Do not drink alcohol for 7 days, or as instructed by your health care provider.  Eat soft foods for the first 24 hours. SEEK MEDICAL CARE IF: You have persistent spotting of blood in your stool 2-3 days after the procedure. SEEK IMMEDIATE MEDICAL CARE IF:  You have more than a  small spotting of blood in your stool.  You pass large blood clots in your stool.  Your abdomen is swollen (distended).  You have nausea or vomiting.  You have a fever.  You have increasing abdominal pain that is not relieved with medicine.   This information is not intended to replace advice given to you by your health care provider. Make sure you discuss any questions you have with your health care provider.   Document Released: 07/01/2004 Document Revised: 09/07/2013 Document Reviewed: 07/25/2013 Elsevier Interactive Patient Education 2016 Elsevier Inc.  High-Fiber Diet Fiber, also called dietary fiber, is a type of carbohydrate found in fruits, vegetables, whole grains, and beans. A high-fiber diet can have many health benefits. Your health care provider may recommend a high-fiber diet to help:  Prevent constipation. Fiber can make your bowel movements more regular.  Lower your cholesterol.  Relieve hemorrhoids, uncomplicated diverticulosis, or irritable bowel syndrome.  Prevent overeating as part of a weight-loss plan.  Prevent heart disease, type 2 diabetes, and certain cancers. WHAT IS MY PLAN? The recommended daily intake of fiber includes:  38 grams for men under age 80.  12 grams for men over age 71.  65 grams for women under age 91.  29 grams for women over age 35. You can get the recommended daily intake of dietary fiber by eating a variety of fruits, vegetables, grains, and beans. Your health care provider may also recommend a fiber supplement if it is not possible to get enough fiber through your diet. WHAT DO I  NEED TO KNOW ABOUT A HIGH-FIBER DIET?  Fiber supplements have not been widely studied for their effectiveness, so it is better to get fiber through food sources.  Always check the fiber content on thenutrition facts label of any prepackaged food. Look for foods that contain at least 5 grams of fiber per serving.  Ask your dietitian if you have  questions about specific foods that are related to your condition, especially if those foods are not listed in the following section.  Increase your daily fiber consumption gradually. Increasing your intake of dietary fiber too quickly may cause bloating, cramping, or gas.  Drink plenty of water. Water helps you to digest fiber. WHAT FOODS CAN I EAT? Grains Whole-grain breads. Multigrain cereal. Oats and oatmeal. Brown rice. Barley. Bulgur wheat. [place]. Bran muffins. Popcorn. Rye wafer crackers. Vegetables Sweet potatoes. Spinach. Kale. Artichokes. Cabbage. Broccoli. Green peas. Carrots. Squash. Fruits Berries. Pears. Apples. Oranges. Avocados. Prunes and raisins. Dried figs. Meats and Other Protein Sources Navy, kidney, pinto, and soy beans. Split peas. Lentils. Nuts and seeds. Dairy Fiber-fortified yogurt. Beverages Fiber-fortified soy milk. Fiber-fortified orange juice. Other Fiber bars. The items listed above may not be a complete list of recommended foods or beverages. Contact your dietitian for more options. WHAT FOODS ARE NOT RECOMMENDED? Grains White bread. Pasta made with refined flour. White rice. Vegetables Fried potatoes. Canned vegetables. Well-cooked vegetables.  Fruits Fruit juice. Cooked, strained fruit. Meats and Other Protein Sources Fatty cuts of meat. Fried Sales executive or fried fish. Dairy Milk. Yogurt. Cream cheese. Sour cream. Beverages Soft drinks. Other Cakes and pastries. Butter and oils. The items listed above may not be a complete list of foods and beverages to avoid. Contact your dietitian for more information. WHAT ARE SOME TIPS FOR INCLUDING HIGH-FIBER FOODS IN MY DIET?  Eat a wide variety of high-fiber foods.  Make sure that half of all grains consumed each day are whole grains.  Replace breads and cereals made from refined flour or white flour with whole-grain breads and cereals.  Replace white rice with brown rice, bulgur wheat, or  millet.  Start the day with a breakfast that is high in fiber, such as a cereal that contains at least 5 grams of fiber per serving.  Use beans in place of meat in soups, salads, or pasta.  Eat high-fiber snacks, such as berries, raw vegetables, nuts, or popcorn.   This information is not intended to replace advice given to you by your health care provider. Make sure you discuss any questions you have with your health care provider.   Document Released: 11/17/2005 Document Revised: 12/08/2014 Document Reviewed: 05/02/2014 Elsevier Interactive Patient Education [employer].

## 2016-08-27 NOTE — H&P (Signed)
Frank [name] is an 65 y.o. male.   Chief Complaint: Patient is here for colonoscopy. HPI: Patient is 65 year old Caucasian male with history of colonic adenomas and is here for surveillance colonoscopy. Last exam was in April 2012 with removal of 2 tubular adenomas. He has intermittent increased frequency of bowel movements. Most of his stools are formed. He has hemorrhoids and on the tissue sometimes. Family history is negative for CRC.  Past Medical History:  Diagnosis Date  . Barrett's esophagus   . BPH (benign prostatic hyperplasia)   . Fracture of 5th metatarsal   . GERD (gastroesophageal reflux disease)   . Hypercholesteremia   . Hypertension   . Mastoiditis   . Ulcer disease    Bulbar    Past Surgical History:  Procedure Laterality Date  . COLONOSCOPY    . COLONOSCOPY    . HEMORRHOID SURGERY  2009  . KNEE ARTHROSCOPY     Right Knee  . ORIF TOE FRACTURE Left 12/09/2013   Procedure: LEFT OPEN REDUCTION INTERNAL FIXATION (ORIF) FIFTH METATARSAL FRACTURE;  Surgeon: Lorn Junes, MD;  Location: [facility];  Service: Orthopedics;  Laterality: Left;  . rotor cuff  2009   right-  . TONSILLECTOMY    . UPPER GASTROINTESTINAL ENDOSCOPY      Family History  Problem Relation Age of Onset  . COPD Mother   . Heart disease Father   . Healthy Sister   . Lung cancer Brother   . Heart disease Brother   . Heart disease Sister   . Prostate cancer Brother   . Healthy Son   . Healthy Son    Social History:  reports that he has never smoked. He has never used smokeless tobacco. He reports that he does not drink alcohol or use drugs.  Allergies:  Allergies  Allergen Reactions  . Ampicillin Rash    Medications Prior to Admission  Medication Sig Dispense Refill  . amLODipine (NORVASC) 5 MG tablet Take 5 mg by mouth daily.    . fish oil-omega-3 fatty acids 1000 MG capsule Take 1 g by mouth daily.    . Ginger, Zingiber officinalis, (GINGER ROOT PO) Take by mouth  daily.    Marland Kitchen glucosamine-chondroitin 500-400 MG tablet Take 1 tablet by mouth 3 (three) times daily.    Marland Kitchen losartan (COZAAR) 100 MG tablet Take 100 mg by mouth daily.    . Misc Natural Products (TART CHERRY ADVANCED PO) Take by mouth.    . Multiple Vitamin (MULTIVITAMIN) capsule Take 1 capsule by mouth daily.    . nabumetone (RELAFEN) 500 MG tablet Take 500 mg by mouth daily.    Marland Kitchen OMEPRAZOLE PO Take 20 mg by mouth.    . polyethylene glycol-electrolytes (TRILYTE) 420 g solution Take 4,000 mLs by mouth once. 4000 mL 0  . calcium carbonate (OS-CAL) 600 MG TABS Take 600 mg by mouth 2 (two) times daily with a meal.    . cephALEXin (KEFLEX) 500 MG capsule Take 1 capsule (500 mg total) by mouth 4 (four) times daily. 28 capsule 0  . HYDROcodone-acetaminophen (NORCO) 5-325 MG per tablet 1-2 tablets every 4-6 hrs as needed for pain 40 tablet 0    No results found for this or any previous visit (from the past 48 hour(s)). No results found.  ROS  Blood pressure (!) 147/97, pulse 63, temperature 97.5 F (36.4 C), temperature source Oral, resp. rate 16, height 5\' 11"  (1.803 m), weight 200 lb (90.7 kg), SpO2 97 %.  Physical Exam  Constitutional: He appears well-developed and well-nourished.  HENT:  Mouth/Throat: Oropharynx is clear and moist.  Eyes: Conjunctivae are normal. No scleral icterus.  Neck: No thyromegaly present.  Cardiovascular: Normal rate, regular rhythm and normal heart sounds.   No murmur heard. Respiratory: Effort normal and breath sounds normal.  GI: Soft. He exhibits no distension and no mass. There is no tenderness.  Small umblical hernia.  Musculoskeletal: He exhibits no edema.  Lymphadenopathy:    He has no cervical adenopathy.  Neurological: He is alert.  Skin: Skin is warm and dry.     Assessment/Plan History of colonic adenomas. Surveillance colonoscopy.  Hildred Laser, MD 08/27/2016, 8:31 AM

## 2016-09-01 ENCOUNTER — Encounter (HOSPITAL_COMMUNITY): Payer: Self-pay | Admitting: Internal Medicine

## 2016-11-04 DIAGNOSIS — H6983 Other specified disorders of Eustachian tube, bilateral: Secondary | ICD-10-CM | POA: Diagnosis not present

## 2016-11-04 DIAGNOSIS — H903 Sensorineural hearing loss, bilateral: Secondary | ICD-10-CM | POA: Diagnosis not present

## 2016-11-04 DIAGNOSIS — H95192 Other disorders following mastoidectomy, left ear: Secondary | ICD-10-CM | POA: Diagnosis not present

## 2016-11-12 DIAGNOSIS — I10 Essential (primary) hypertension: Secondary | ICD-10-CM | POA: Diagnosis not present

## 2016-12-02 DIAGNOSIS — H95192 Other disorders following mastoidectomy, left ear: Secondary | ICD-10-CM | POA: Diagnosis not present

## 2016-12-02 DIAGNOSIS — H903 Sensorineural hearing loss, bilateral: Secondary | ICD-10-CM | POA: Diagnosis not present

## 2016-12-02 DIAGNOSIS — H6983 Other specified disorders of Eustachian tube, bilateral: Secondary | ICD-10-CM | POA: Diagnosis not present

## 2017-02-05 DIAGNOSIS — Z125 Encounter for screening for malignant neoplasm of prostate: Secondary | ICD-10-CM | POA: Diagnosis not present

## 2017-02-05 DIAGNOSIS — Z79899 Other long term (current) drug therapy: Secondary | ICD-10-CM | POA: Diagnosis not present

## 2017-02-05 DIAGNOSIS — E785 Hyperlipidemia, unspecified: Secondary | ICD-10-CM | POA: Diagnosis not present

## 2017-02-05 DIAGNOSIS — I10 Essential (primary) hypertension: Secondary | ICD-10-CM | POA: Diagnosis not present

## 2017-02-05 DIAGNOSIS — K219 Gastro-esophageal reflux disease without esophagitis: Secondary | ICD-10-CM | POA: Diagnosis not present

## 2017-02-07 IMAGING — DX DG CHEST 2V
2 series · 2 of 2 positions shown · non-contrast
Comparison: February 25, 2008

CLINICAL DATA: Four day history of fever

EXAM:
CHEST  2 VIEW

[chest pa]
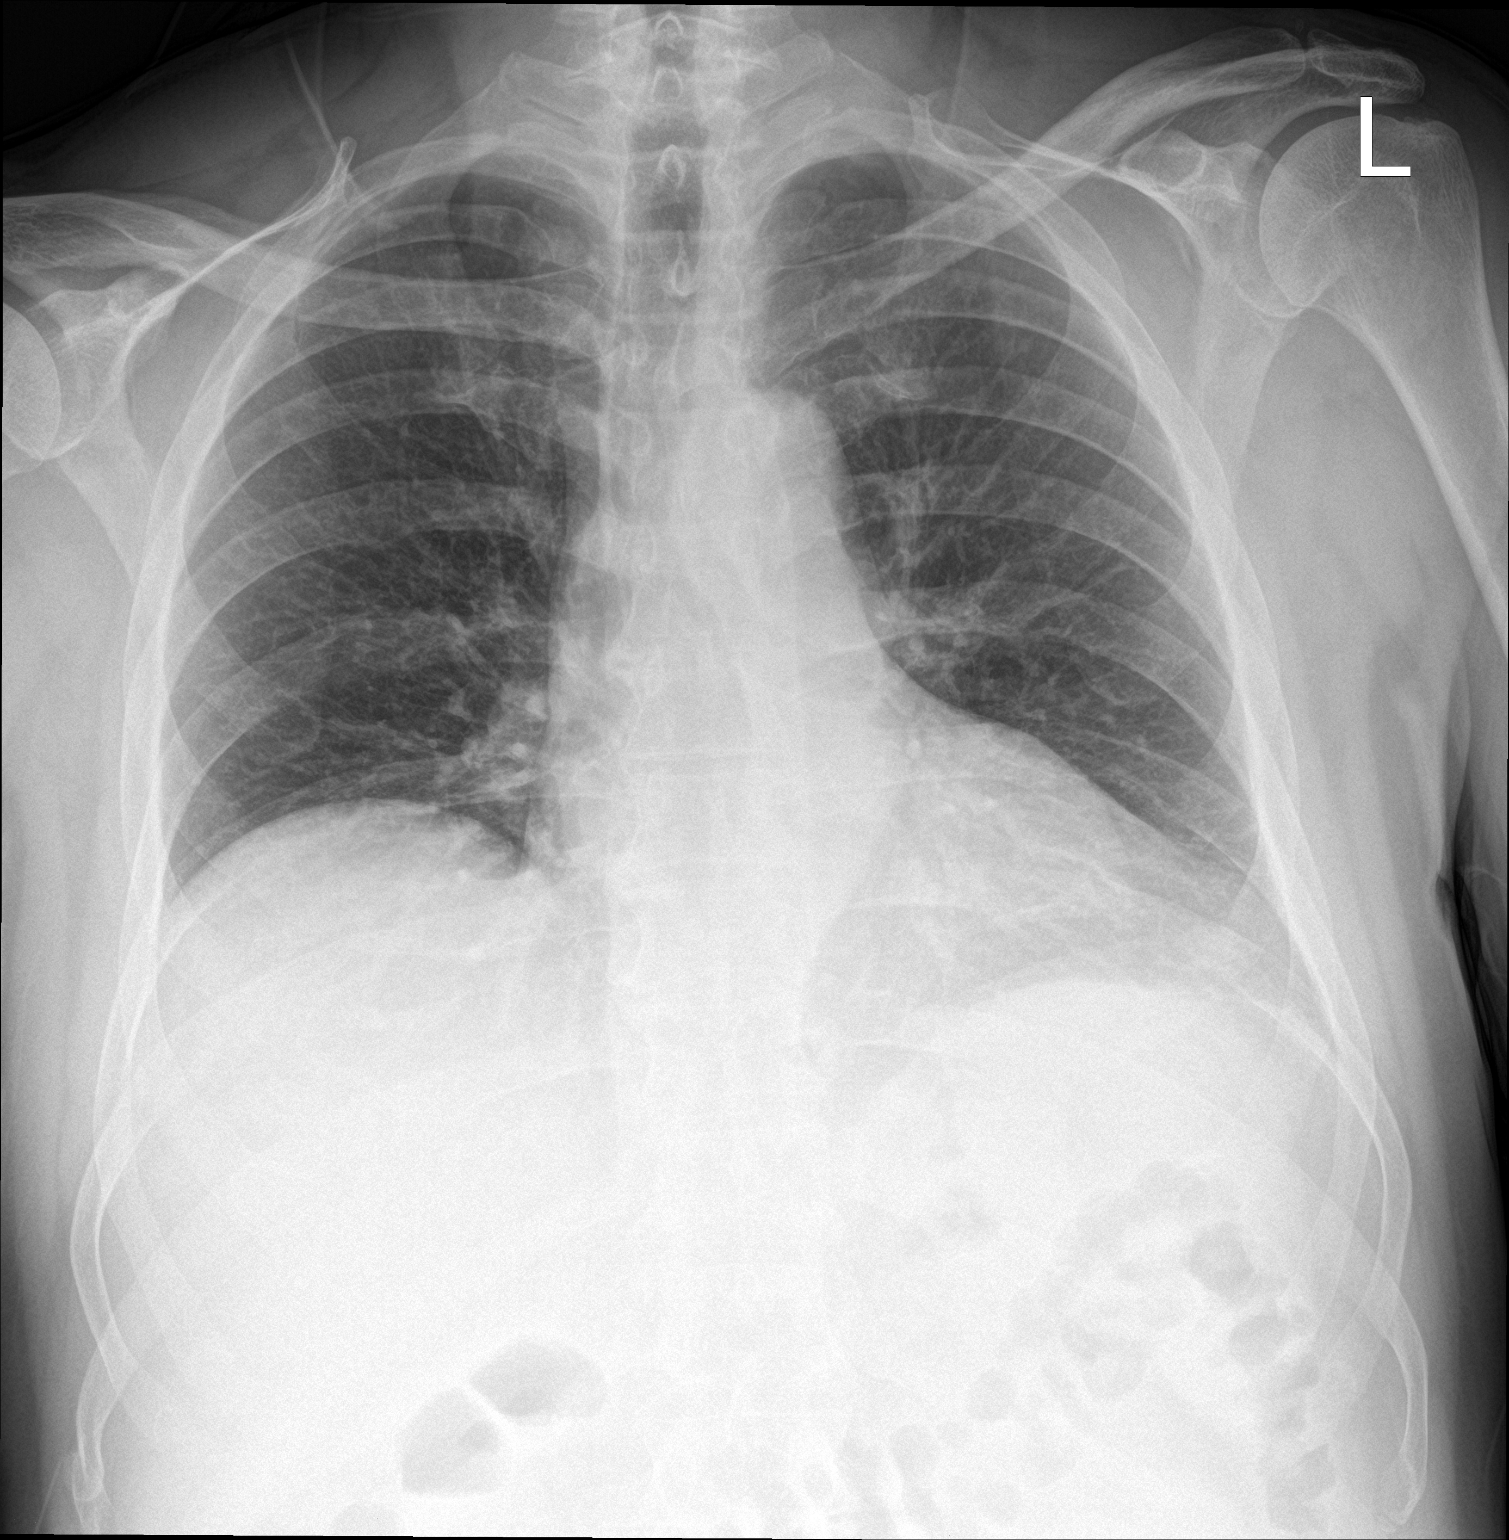

[chest lat]
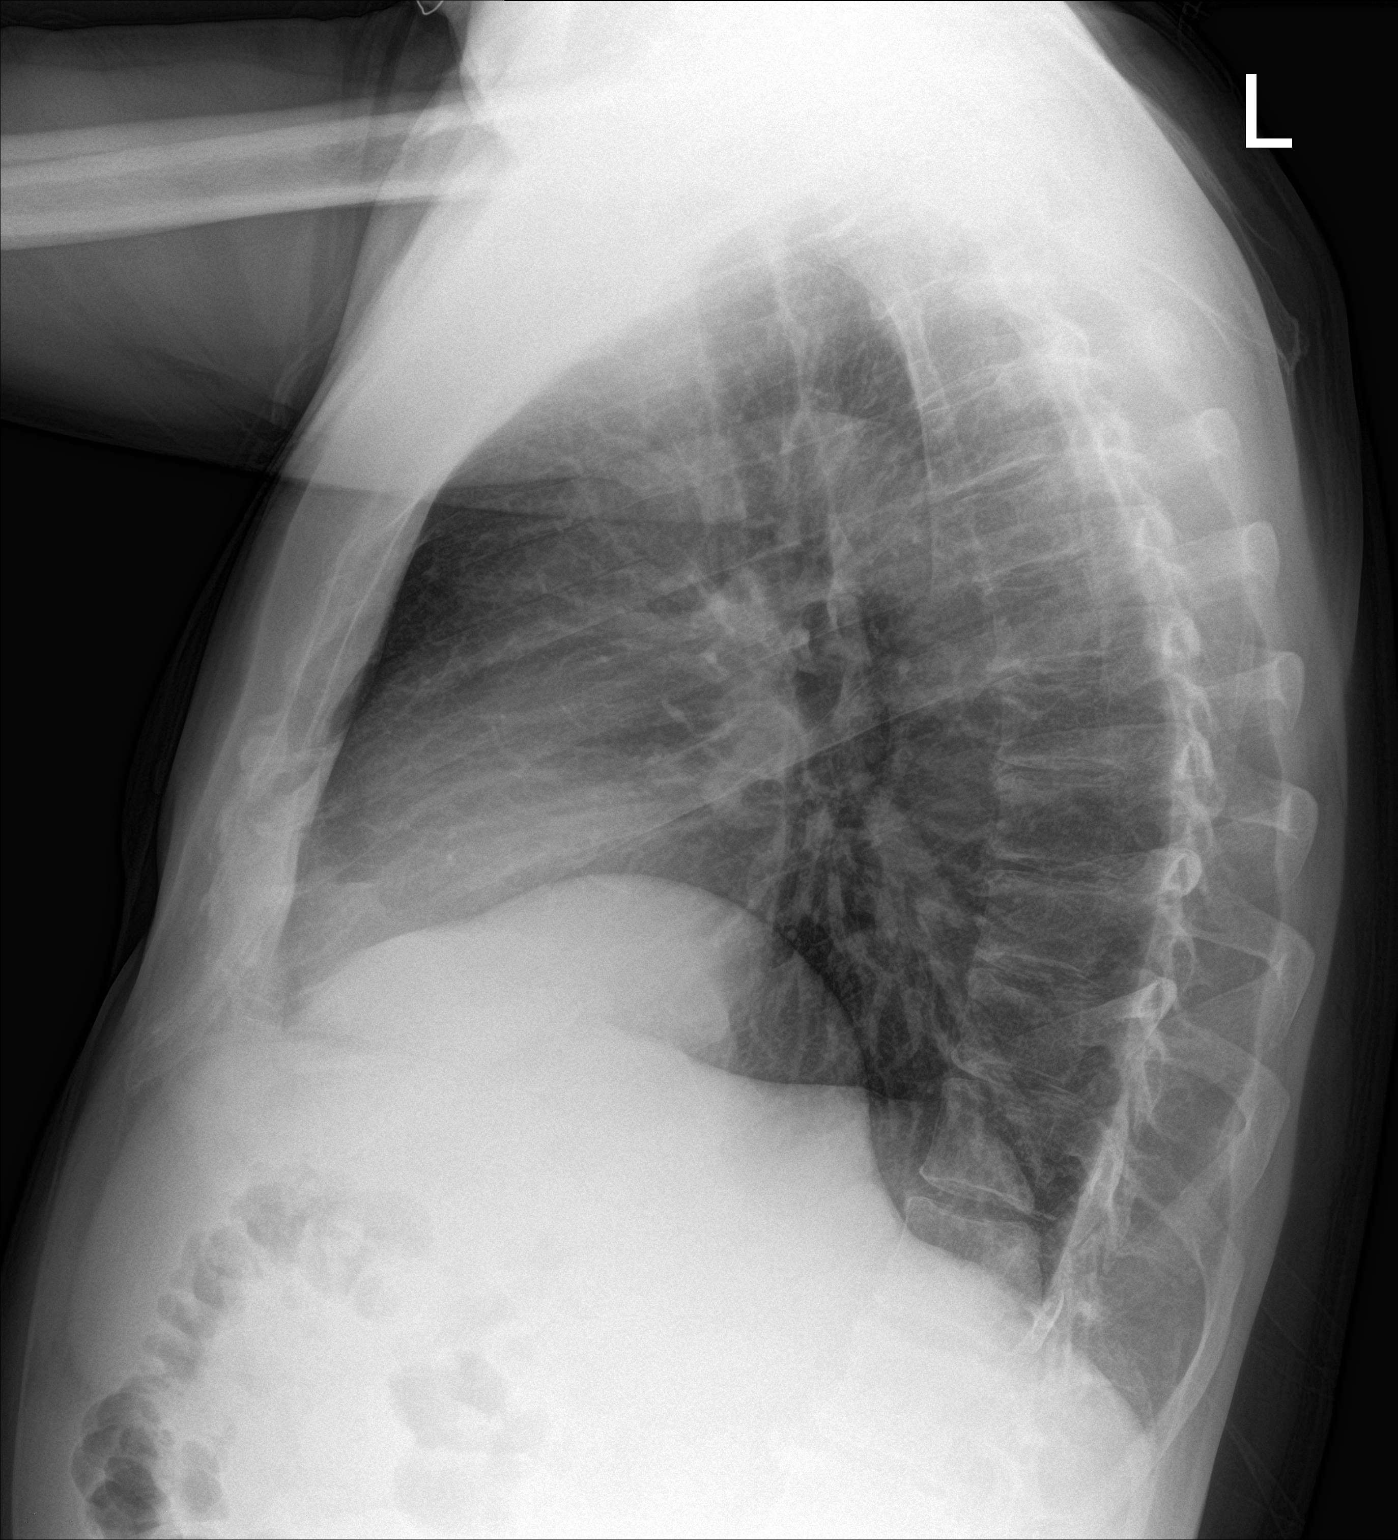

[2 of 2 positions shown; findings below may reference images not displayed]

FINDINGS: There is no edema or consolidation. The heart size and pulmonary
vascularity are normal. No adenopathy. No bone lesions.
IMPRESSION: No edema or consolidation.

## 2017-02-12 DIAGNOSIS — K219 Gastro-esophageal reflux disease without esophagitis: Secondary | ICD-10-CM | POA: Diagnosis not present

## 2017-02-12 DIAGNOSIS — E785 Hyperlipidemia, unspecified: Secondary | ICD-10-CM | POA: Diagnosis not present

## 2017-02-12 DIAGNOSIS — I10 Essential (primary) hypertension: Secondary | ICD-10-CM | POA: Diagnosis not present

## 2017-02-12 DIAGNOSIS — Z0001 Encounter for general adult medical examination with abnormal findings: Secondary | ICD-10-CM | POA: Diagnosis not present

## 2017-05-21 DIAGNOSIS — I10 Essential (primary) hypertension: Secondary | ICD-10-CM | POA: Diagnosis not present

## 2017-05-21 DIAGNOSIS — Z6829 Body mass index (BMI) 29.0-29.9, adult: Secondary | ICD-10-CM | POA: Diagnosis not present

## 2017-05-21 DIAGNOSIS — R7303 Prediabetes: Secondary | ICD-10-CM | POA: Diagnosis not present

## 2017-09-24 DIAGNOSIS — I10 Essential (primary) hypertension: Secondary | ICD-10-CM | POA: Diagnosis not present

## 2017-09-24 DIAGNOSIS — Z23 Encounter for immunization: Secondary | ICD-10-CM | POA: Diagnosis not present

## 2017-09-24 DIAGNOSIS — R7303 Prediabetes: Secondary | ICD-10-CM | POA: Diagnosis not present

## 2017-12-17 DIAGNOSIS — H6983 Other specified disorders of Eustachian tube, bilateral: Secondary | ICD-10-CM | POA: Diagnosis not present

## 2017-12-17 DIAGNOSIS — H95192 Other disorders following mastoidectomy, left ear: Secondary | ICD-10-CM | POA: Diagnosis not present

## 2017-12-17 DIAGNOSIS — H903 Sensorineural hearing loss, bilateral: Secondary | ICD-10-CM | POA: Diagnosis not present

## 2018-03-30 DIAGNOSIS — M1990 Unspecified osteoarthritis, unspecified site: Secondary | ICD-10-CM | POA: Diagnosis not present

## 2018-03-30 DIAGNOSIS — E785 Hyperlipidemia, unspecified: Secondary | ICD-10-CM | POA: Diagnosis not present

## 2018-03-30 DIAGNOSIS — I10 Essential (primary) hypertension: Secondary | ICD-10-CM | POA: Diagnosis not present

## 2018-03-30 DIAGNOSIS — K219 Gastro-esophageal reflux disease without esophagitis: Secondary | ICD-10-CM | POA: Diagnosis not present

## 2018-03-30 DIAGNOSIS — Z125 Encounter for screening for malignant neoplasm of prostate: Secondary | ICD-10-CM | POA: Diagnosis not present

## 2018-03-30 DIAGNOSIS — R7301 Impaired fasting glucose: Secondary | ICD-10-CM | POA: Diagnosis not present

## 2018-03-30 DIAGNOSIS — Z79899 Other long term (current) drug therapy: Secondary | ICD-10-CM | POA: Diagnosis not present

## 2018-04-08 DIAGNOSIS — E785 Hyperlipidemia, unspecified: Secondary | ICD-10-CM | POA: Diagnosis not present

## 2018-04-08 DIAGNOSIS — I10 Essential (primary) hypertension: Secondary | ICD-10-CM | POA: Diagnosis not present

## 2018-04-08 DIAGNOSIS — R7303 Prediabetes: Secondary | ICD-10-CM | POA: Diagnosis not present

## 2018-04-08 DIAGNOSIS — Z0001 Encounter for general adult medical examination with abnormal findings: Secondary | ICD-10-CM | POA: Diagnosis not present

## 2018-09-09 DIAGNOSIS — Z23 Encounter for immunization: Secondary | ICD-10-CM | POA: Diagnosis not present

## 2018-10-04 DIAGNOSIS — I10 Essential (primary) hypertension: Secondary | ICD-10-CM | POA: Diagnosis not present

## 2018-10-04 DIAGNOSIS — N183 Chronic kidney disease, stage 3 (moderate): Secondary | ICD-10-CM | POA: Diagnosis not present

## 2018-10-11 DIAGNOSIS — N183 Chronic kidney disease, stage 3 (moderate): Secondary | ICD-10-CM | POA: Diagnosis not present

## 2018-10-11 DIAGNOSIS — H6983 Other specified disorders of Eustachian tube, bilateral: Secondary | ICD-10-CM | POA: Diagnosis not present

## 2018-10-11 DIAGNOSIS — I10 Essential (primary) hypertension: Secondary | ICD-10-CM | POA: Diagnosis not present

## 2018-10-11 DIAGNOSIS — H95192 Other disorders following mastoidectomy, left ear: Secondary | ICD-10-CM | POA: Diagnosis not present

## 2018-10-11 DIAGNOSIS — N644 Mastodynia: Secondary | ICD-10-CM | POA: Diagnosis not present

## 2018-10-11 DIAGNOSIS — H906 Mixed conductive and sensorineural hearing loss, bilateral: Secondary | ICD-10-CM | POA: Diagnosis not present

## 2018-10-21 DIAGNOSIS — N62 Hypertrophy of breast: Secondary | ICD-10-CM | POA: Diagnosis not present

## 2019-04-18 DIAGNOSIS — N62 Hypertrophy of breast: Secondary | ICD-10-CM | POA: Diagnosis not present

## 2019-04-20 DIAGNOSIS — E785 Hyperlipidemia, unspecified: Secondary | ICD-10-CM | POA: Diagnosis not present

## 2019-04-20 DIAGNOSIS — K219 Gastro-esophageal reflux disease without esophagitis: Secondary | ICD-10-CM | POA: Diagnosis not present

## 2019-04-20 DIAGNOSIS — M1990 Unspecified osteoarthritis, unspecified site: Secondary | ICD-10-CM | POA: Diagnosis not present

## 2019-04-20 DIAGNOSIS — R7303 Prediabetes: Secondary | ICD-10-CM | POA: Diagnosis not present

## 2019-04-20 DIAGNOSIS — I10 Essential (primary) hypertension: Secondary | ICD-10-CM | POA: Diagnosis not present

## 2019-04-20 DIAGNOSIS — Z79899 Other long term (current) drug therapy: Secondary | ICD-10-CM | POA: Diagnosis not present

## 2019-04-20 DIAGNOSIS — N183 Chronic kidney disease, stage 3 (moderate): Secondary | ICD-10-CM | POA: Diagnosis not present

## 2019-04-20 DIAGNOSIS — Z125 Encounter for screening for malignant neoplasm of prostate: Secondary | ICD-10-CM | POA: Diagnosis not present

## 2019-04-26 DIAGNOSIS — I129 Hypertensive chronic kidney disease with stage 1 through stage 4 chronic kidney disease, or unspecified chronic kidney disease: Secondary | ICD-10-CM | POA: Diagnosis not present

## 2019-04-26 DIAGNOSIS — N183 Chronic kidney disease, stage 3 (moderate): Secondary | ICD-10-CM | POA: Diagnosis not present

## 2019-04-26 DIAGNOSIS — E1159 Type 2 diabetes mellitus with other circulatory complications: Secondary | ICD-10-CM | POA: Diagnosis not present

## 2019-04-26 DIAGNOSIS — E785 Hyperlipidemia, unspecified: Secondary | ICD-10-CM | POA: Diagnosis not present

## 2019-06-06 ENCOUNTER — Other Ambulatory Visit: Payer: Self-pay

## 2019-06-06 ENCOUNTER — Encounter: Payer: Self-pay | Admitting: Podiatry

## 2019-06-06 ENCOUNTER — Ambulatory Visit: Payer: PPO | Admitting: Podiatry

## 2019-06-06 VITALS — Temp 97.7°F

## 2019-06-06 DIAGNOSIS — L600 Ingrowing nail: Secondary | ICD-10-CM

## 2019-06-06 DIAGNOSIS — B351 Tinea unguium: Secondary | ICD-10-CM

## 2019-06-06 MED ORDER — NEOMYCIN-POLYMYXIN-HC 1 % OT SOLN: 3.0000 [drp] | Freq: Four times a day (QID) | OTIC | 0 refills | Status: DC

## 2019-06-06 NOTE — Progress Notes (Signed)
Subjective:   Patient ID: Frank [name], male   DOB: 68 y.o.   MRN: [id number]   HPI Patient presents stating that he has a severe nail disease left big toenail and the right one is partially bothersome but not to the same degree.  States it is growing abnormally and is tender.  Patient does not smoke likes to be active   Review of Systems  All other systems reviewed and are negative.       Objective:  Physical Exam Vitals signs and nursing note reviewed.  Constitutional:      Appearance: He is well-developed.  Pulmonary:     Effort: Pulmonary effort is normal.  Musculoskeletal: Normal range of motion.  Skin:    General: Skin is warm.  Neurological:     Mental Status: He is alert.     Neurovascular status found to be intact muscle strength is adequate range of motion within normal limits with severely thickened left hallux nail that is growing abnormally and painful with palpation with the right hallux nail showing moderate distal irritation but no other indication of pathology.  Patient does have good digital perfusion well oriented x3      Assessment:  Severe damage to the left hallux nailbed with pain with moderate damage to the right     Plan:  H&P reviewed condition and recommended removal of the left hallux nail.  Today I went ahead discussed surgery explaining risk and patient signed consent form and I infiltrated the left hallux 60 mg like a Marcaine mixture I did sterile prep and using sterile instrumentation remove the hallux nail exposed matrix and applied phenol 3 applications 30 seconds followed by alcohol lavage and sterile dressing.  Gave instructions on soaks and patient will be seen back for [place] to recheck

## 2019-06-06 NOTE — Patient Instructions (Signed)

## 2019-07-25 DIAGNOSIS — E1159 Type 2 diabetes mellitus with other circulatory complications: Secondary | ICD-10-CM | POA: Diagnosis not present

## 2019-07-25 DIAGNOSIS — M1990 Unspecified osteoarthritis, unspecified site: Secondary | ICD-10-CM | POA: Diagnosis not present

## 2019-07-25 DIAGNOSIS — E785 Hyperlipidemia, unspecified: Secondary | ICD-10-CM | POA: Diagnosis not present

## 2019-07-25 DIAGNOSIS — I10 Essential (primary) hypertension: Secondary | ICD-10-CM | POA: Diagnosis not present

## 2019-07-25 DIAGNOSIS — Z79899 Other long term (current) drug therapy: Secondary | ICD-10-CM | POA: Diagnosis not present

## 2019-07-25 DIAGNOSIS — K219 Gastro-esophageal reflux disease without esophagitis: Secondary | ICD-10-CM | POA: Diagnosis not present

## 2019-07-25 DIAGNOSIS — N183 Chronic kidney disease, stage 3 (moderate): Secondary | ICD-10-CM | POA: Diagnosis not present

## 2019-08-01 DIAGNOSIS — E1169 Type 2 diabetes mellitus with other specified complication: Secondary | ICD-10-CM | POA: Diagnosis not present

## 2019-08-01 DIAGNOSIS — E785 Hyperlipidemia, unspecified: Secondary | ICD-10-CM | POA: Diagnosis not present

## 2019-08-23 DIAGNOSIS — H906 Mixed conductive and sensorineural hearing loss, bilateral: Secondary | ICD-10-CM | POA: Diagnosis not present

## 2019-09-09 DIAGNOSIS — H903 Sensorineural hearing loss, bilateral: Secondary | ICD-10-CM | POA: Diagnosis not present

## 2019-09-16 DIAGNOSIS — H903 Sensorineural hearing loss, bilateral: Secondary | ICD-10-CM | POA: Insufficient documentation

## 2019-09-16 DIAGNOSIS — H7493 Unspecified disorder of middle ear and mastoid, bilateral: Secondary | ICD-10-CM | POA: Diagnosis not present

## 2019-09-16 DIAGNOSIS — Z974 Presence of external hearing-aid: Secondary | ICD-10-CM | POA: Diagnosis not present

## 2019-09-16 DIAGNOSIS — H7492 Unspecified disorder of left middle ear and mastoid: Secondary | ICD-10-CM | POA: Insufficient documentation

## 2019-09-16 DIAGNOSIS — H90A31 Mixed conductive and sensorineural hearing loss, unilateral, right ear with restricted hearing on the contralateral side: Secondary | ICD-10-CM | POA: Diagnosis not present

## 2019-09-16 DIAGNOSIS — Z9621 Cochlear implant status: Secondary | ICD-10-CM | POA: Diagnosis not present

## 2019-09-16 DIAGNOSIS — H90A22 Sensorineural hearing loss, unilateral, left ear, with restricted hearing on the contralateral side: Secondary | ICD-10-CM | POA: Diagnosis not present

## 2019-09-16 DIAGNOSIS — H95193 Other disorders following mastoidectomy, bilateral ears: Secondary | ICD-10-CM | POA: Diagnosis not present

## 2019-09-16 DIAGNOSIS — H906 Mixed conductive and sensorineural hearing loss, bilateral: Secondary | ICD-10-CM | POA: Diagnosis not present

## 2019-09-30 ENCOUNTER — Other Ambulatory Visit: Payer: Self-pay

## 2019-09-30 DIAGNOSIS — Z20822 Contact with and (suspected) exposure to covid-19: Secondary | ICD-10-CM

## 2019-10-02 LAB — NOVEL CORONAVIRUS, NAA: SARS-CoV-2, NAA: DETECTED — AB

## 2019-10-19 DIAGNOSIS — N62 Hypertrophy of breast: Secondary | ICD-10-CM | POA: Diagnosis not present

## 2019-10-24 DIAGNOSIS — Z79899 Other long term (current) drug therapy: Secondary | ICD-10-CM | POA: Diagnosis not present

## 2019-10-24 DIAGNOSIS — E785 Hyperlipidemia, unspecified: Secondary | ICD-10-CM | POA: Diagnosis not present

## 2019-10-24 DIAGNOSIS — E118 Type 2 diabetes mellitus with unspecified complications: Secondary | ICD-10-CM | POA: Diagnosis not present

## 2019-10-31 DIAGNOSIS — Z23 Encounter for immunization: Secondary | ICD-10-CM | POA: Diagnosis not present

## 2019-10-31 DIAGNOSIS — U071 COVID-19: Secondary | ICD-10-CM | POA: Diagnosis not present

## 2019-10-31 DIAGNOSIS — E785 Hyperlipidemia, unspecified: Secondary | ICD-10-CM | POA: Diagnosis not present

## 2019-12-14 DIAGNOSIS — K644 Residual hemorrhoidal skin tags: Secondary | ICD-10-CM | POA: Diagnosis not present

## 2019-12-14 DIAGNOSIS — K429 Umbilical hernia without obstruction or gangrene: Secondary | ICD-10-CM | POA: Diagnosis not present

## 2019-12-27 DIAGNOSIS — L219 Seborrheic dermatitis, unspecified: Secondary | ICD-10-CM | POA: Diagnosis not present

## 2019-12-27 DIAGNOSIS — L918 Other hypertrophic disorders of the skin: Secondary | ICD-10-CM | POA: Diagnosis not present

## 2019-12-27 DIAGNOSIS — D1801 Hemangioma of skin and subcutaneous tissue: Secondary | ICD-10-CM | POA: Diagnosis not present

## 2019-12-27 DIAGNOSIS — L723 Sebaceous cyst: Secondary | ICD-10-CM | POA: Diagnosis not present

## 2019-12-27 DIAGNOSIS — L821 Other seborrheic keratosis: Secondary | ICD-10-CM | POA: Diagnosis not present

## 2019-12-27 DIAGNOSIS — B353 Tinea pedis: Secondary | ICD-10-CM | POA: Diagnosis not present

## 2019-12-27 DIAGNOSIS — L814 Other melanin hyperpigmentation: Secondary | ICD-10-CM | POA: Diagnosis not present

## 2019-12-27 DIAGNOSIS — L578 Other skin changes due to chronic exposure to nonionizing radiation: Secondary | ICD-10-CM | POA: Diagnosis not present

## 2020-01-11 DIAGNOSIS — K1329 Other disturbances of oral epithelium, including tongue: Secondary | ICD-10-CM | POA: Diagnosis not present

## 2020-02-02 DIAGNOSIS — K1329 Other disturbances of oral epithelium, including tongue: Secondary | ICD-10-CM | POA: Diagnosis not present

## 2020-03-16 DIAGNOSIS — H7493 Unspecified disorder of middle ear and mastoid, bilateral: Secondary | ICD-10-CM | POA: Diagnosis not present

## 2020-03-16 DIAGNOSIS — Z974 Presence of external hearing-aid: Secondary | ICD-10-CM | POA: Diagnosis not present

## 2020-03-16 DIAGNOSIS — H906 Mixed conductive and sensorineural hearing loss, bilateral: Secondary | ICD-10-CM | POA: Diagnosis not present

## 2020-03-16 DIAGNOSIS — H95191 Other disorders following mastoidectomy, right ear: Secondary | ICD-10-CM | POA: Diagnosis not present

## 2020-03-27 DIAGNOSIS — L570 Actinic keratosis: Secondary | ICD-10-CM | POA: Diagnosis not present

## 2020-03-27 DIAGNOSIS — L739 Follicular disorder, unspecified: Secondary | ICD-10-CM | POA: Diagnosis not present

## 2020-03-27 DIAGNOSIS — B353 Tinea pedis: Secondary | ICD-10-CM | POA: Diagnosis not present

## 2020-03-27 DIAGNOSIS — L219 Seborrheic dermatitis, unspecified: Secondary | ICD-10-CM | POA: Diagnosis not present

## 2020-04-19 DIAGNOSIS — E119 Type 2 diabetes mellitus without complications: Secondary | ICD-10-CM | POA: Diagnosis not present

## 2020-04-19 DIAGNOSIS — Z125 Encounter for screening for malignant neoplasm of prostate: Secondary | ICD-10-CM | POA: Diagnosis not present

## 2020-04-19 DIAGNOSIS — E785 Hyperlipidemia, unspecified: Secondary | ICD-10-CM | POA: Diagnosis not present

## 2020-04-19 DIAGNOSIS — I10 Essential (primary) hypertension: Secondary | ICD-10-CM | POA: Diagnosis not present

## 2020-04-19 DIAGNOSIS — Z79899 Other long term (current) drug therapy: Secondary | ICD-10-CM | POA: Diagnosis not present

## 2020-04-19 DIAGNOSIS — K219 Gastro-esophageal reflux disease without esophagitis: Secondary | ICD-10-CM | POA: Diagnosis not present

## 2020-04-19 DIAGNOSIS — N529 Male erectile dysfunction, unspecified: Secondary | ICD-10-CM | POA: Diagnosis not present

## 2020-04-25 DIAGNOSIS — M25552 Pain in left hip: Secondary | ICD-10-CM | POA: Diagnosis not present

## 2020-04-25 DIAGNOSIS — S46011A Strain of muscle(s) and tendon(s) of the rotator cuff of right shoulder, initial encounter: Secondary | ICD-10-CM | POA: Diagnosis not present

## 2020-04-25 DIAGNOSIS — S139XXA Sprain of joints and ligaments of unspecified parts of neck, initial encounter: Secondary | ICD-10-CM | POA: Diagnosis not present

## 2020-04-25 DIAGNOSIS — M25551 Pain in right hip: Secondary | ICD-10-CM | POA: Diagnosis not present

## 2020-04-25 DIAGNOSIS — S335XXA Sprain of ligaments of lumbar spine, initial encounter: Secondary | ICD-10-CM | POA: Diagnosis not present

## 2020-04-26 ENCOUNTER — Other Ambulatory Visit (HOSPITAL_COMMUNITY): Payer: Self-pay | Admitting: Internal Medicine

## 2020-04-26 ENCOUNTER — Other Ambulatory Visit: Payer: Self-pay | Admitting: Internal Medicine

## 2020-04-26 DIAGNOSIS — R799 Abnormal finding of blood chemistry, unspecified: Secondary | ICD-10-CM

## 2020-04-26 DIAGNOSIS — I10 Essential (primary) hypertension: Secondary | ICD-10-CM | POA: Diagnosis not present

## 2020-04-26 DIAGNOSIS — Z6830 Body mass index (BMI) 30.0-30.9, adult: Secondary | ICD-10-CM | POA: Diagnosis not present

## 2020-04-26 DIAGNOSIS — E785 Hyperlipidemia, unspecified: Secondary | ICD-10-CM | POA: Diagnosis not present

## 2020-04-26 DIAGNOSIS — N1831 Chronic kidney disease, stage 3a: Secondary | ICD-10-CM | POA: Diagnosis not present

## 2020-04-26 DIAGNOSIS — M791 Myalgia, unspecified site: Secondary | ICD-10-CM | POA: Diagnosis not present

## 2020-04-26 DIAGNOSIS — I493 Ventricular premature depolarization: Secondary | ICD-10-CM | POA: Diagnosis not present

## 2020-04-26 DIAGNOSIS — Z0001 Encounter for general adult medical examination with abnormal findings: Secondary | ICD-10-CM | POA: Diagnosis not present

## 2020-04-26 DIAGNOSIS — E1122 Type 2 diabetes mellitus with diabetic chronic kidney disease: Secondary | ICD-10-CM | POA: Diagnosis not present

## 2020-05-01 ENCOUNTER — Ambulatory Visit (HOSPITAL_COMMUNITY)
Admission: RE | Admit: 2020-05-01 | Discharge: 2020-05-01 | Disposition: A | Discharge status: Home / Self Care | Payer: PPO | Source: Ambulatory Visit | Attending: Internal Medicine | Admitting: Internal Medicine

## 2020-05-01 ENCOUNTER — Other Ambulatory Visit: Payer: Self-pay

## 2020-05-01 DIAGNOSIS — N200 Calculus of kidney: Secondary | ICD-10-CM | POA: Insufficient documentation

## 2020-05-01 DIAGNOSIS — N281 Cyst of kidney, acquired: Secondary | ICD-10-CM | POA: Insufficient documentation

## 2020-05-01 DIAGNOSIS — R7989 Other specified abnormal findings of blood chemistry: Secondary | ICD-10-CM | POA: Diagnosis not present

## 2020-05-01 DIAGNOSIS — R799 Abnormal finding of blood chemistry, unspecified: Secondary | ICD-10-CM

## 2020-07-19 DIAGNOSIS — K429 Umbilical hernia without obstruction or gangrene: Secondary | ICD-10-CM | POA: Diagnosis not present

## 2020-07-19 DIAGNOSIS — K644 Residual hemorrhoidal skin tags: Secondary | ICD-10-CM | POA: Diagnosis not present

## 2020-07-19 DIAGNOSIS — Z79899 Other long term (current) drug therapy: Secondary | ICD-10-CM | POA: Diagnosis not present

## 2020-07-19 DIAGNOSIS — I10 Essential (primary) hypertension: Secondary | ICD-10-CM | POA: Diagnosis not present

## 2020-07-19 DIAGNOSIS — E1129 Type 2 diabetes mellitus with other diabetic kidney complication: Secondary | ICD-10-CM | POA: Diagnosis not present

## 2020-07-19 DIAGNOSIS — E785 Hyperlipidemia, unspecified: Secondary | ICD-10-CM | POA: Diagnosis not present

## 2020-07-19 DIAGNOSIS — K219 Gastro-esophageal reflux disease without esophagitis: Secondary | ICD-10-CM | POA: Diagnosis not present

## 2020-07-19 DIAGNOSIS — N183 Chronic kidney disease, stage 3 unspecified: Secondary | ICD-10-CM | POA: Diagnosis not present

## 2020-07-26 DIAGNOSIS — E785 Hyperlipidemia, unspecified: Secondary | ICD-10-CM | POA: Diagnosis not present

## 2020-07-26 DIAGNOSIS — R7309 Other abnormal glucose: Secondary | ICD-10-CM | POA: Diagnosis not present

## 2020-07-26 DIAGNOSIS — E1122 Type 2 diabetes mellitus with diabetic chronic kidney disease: Secondary | ICD-10-CM | POA: Diagnosis not present

## 2020-09-05 ENCOUNTER — Other Ambulatory Visit: Payer: Self-pay

## 2020-09-05 ENCOUNTER — Other Ambulatory Visit: Payer: Self-pay | Admitting: Internal Medicine

## 2020-09-05 ENCOUNTER — Other Ambulatory Visit (HOSPITAL_COMMUNITY): Payer: Self-pay | Admitting: Internal Medicine

## 2020-09-05 ENCOUNTER — Ambulatory Visit (HOSPITAL_COMMUNITY)
Admission: RE | Admit: 2020-09-05 | Discharge: 2020-09-05 | Disposition: A | Discharge status: Home / Self Care | Payer: PPO | Source: Ambulatory Visit | Attending: Internal Medicine | Admitting: Internal Medicine

## 2020-09-05 DIAGNOSIS — R1031 Right lower quadrant pain: Secondary | ICD-10-CM | POA: Insufficient documentation

## 2020-09-05 DIAGNOSIS — R109 Unspecified abdominal pain: Secondary | ICD-10-CM | POA: Diagnosis not present

## 2020-09-05 LAB — POCT I-STAT CREATININE: Creatinine, Ser: 1.1 mg/dL (ref 0.61–1.24)

## 2020-09-05 MED ORDER — IOHEXOL 300 MG/ML  SOLN
100.0000 mL | Freq: Once | INTRAMUSCULAR | Status: AC | PRN
  Administered 2020-09-05: 100 mL via INTRAVENOUS

## 2020-09-13 DIAGNOSIS — H95193 Other disorders following mastoidectomy, bilateral ears: Secondary | ICD-10-CM | POA: Diagnosis not present

## 2020-09-13 DIAGNOSIS — Z974 Presence of external hearing-aid: Secondary | ICD-10-CM | POA: Diagnosis not present

## 2020-09-13 DIAGNOSIS — H7493 Unspecified disorder of middle ear and mastoid, bilateral: Secondary | ICD-10-CM | POA: Diagnosis not present

## 2020-09-13 DIAGNOSIS — H906 Mixed conductive and sensorineural hearing loss, bilateral: Secondary | ICD-10-CM | POA: Diagnosis not present

## 2020-09-13 DIAGNOSIS — H90A31 Mixed conductive and sensorineural hearing loss, unilateral, right ear with restricted hearing on the contralateral side: Secondary | ICD-10-CM | POA: Diagnosis not present

## 2020-09-27 DIAGNOSIS — M545 Low back pain, unspecified: Secondary | ICD-10-CM | POA: Diagnosis not present

## 2020-09-28 ENCOUNTER — Other Ambulatory Visit: Payer: Self-pay | Admitting: Surgery

## 2020-09-28 DIAGNOSIS — K644 Residual hemorrhoidal skin tags: Secondary | ICD-10-CM | POA: Diagnosis not present

## 2020-09-28 DIAGNOSIS — I10 Essential (primary) hypertension: Secondary | ICD-10-CM | POA: Diagnosis not present

## 2020-09-28 DIAGNOSIS — K4090 Unilateral inguinal hernia, without obstruction or gangrene, not specified as recurrent: Secondary | ICD-10-CM | POA: Diagnosis not present

## 2020-09-28 DIAGNOSIS — K429 Umbilical hernia without obstruction or gangrene: Secondary | ICD-10-CM | POA: Diagnosis not present

## 2020-10-11 ENCOUNTER — Other Ambulatory Visit (HOSPITAL_COMMUNITY)
Admission: RE | Admit: 2020-10-11 | Discharge: 2020-10-11 | Disposition: A | Discharge status: Home / Self Care | Payer: PPO | Source: Ambulatory Visit | Attending: Surgery | Admitting: Surgery

## 2020-10-11 DIAGNOSIS — Z20822 Contact with and (suspected) exposure to covid-19: Secondary | ICD-10-CM | POA: Diagnosis not present

## 2020-10-11 DIAGNOSIS — Z01812 Encounter for preprocedural laboratory examination: Secondary | ICD-10-CM | POA: Diagnosis not present

## 2020-10-11 LAB — SARS CORONAVIRUS 2 (TAT 6-24 HRS): SARS Coronavirus 2: NEGATIVE

## 2020-10-12 ENCOUNTER — Encounter (HOSPITAL_COMMUNITY): Payer: Self-pay | Admitting: Surgery

## 2020-10-12 ENCOUNTER — Other Ambulatory Visit: Payer: Self-pay

## 2020-10-13 MED ORDER — BUPIVACAINE LIPOSOME 1.3 % IJ SUSP
20.0000 mL | Freq: Once | INTRAMUSCULAR | Status: AC
  Administered 2020-10-15: 10 mL
  Filled 2020-10-13 (×2): qty 20

## 2020-10-13 NOTE — H&P (Signed)
Frank [name]  Location: [facility] Patient #: [id number] DOB: [birth year] Married / Language: English / Race: White Male   History of Present Illness   The patient is a 69 year old male who presents with a complaint of hernia.  The PCP is Frank [name]  He comes by himself. He is an old patient of mine.  About 2 weeks ago he noticed pain in his right lower quadrant. He had an abdominal CT scan on 09/05/2020 which showed: 1. No acute abdominal/pelvic findings, mass lesions or adenopathy. 2. Diffuse colonic diverticulosis but no findings for acute diverticulitis. 3. Small periumbilical abdominal wall hernia containing fat. The fat is slightly injected and could not exclude mild incarceration. And later he noticed a bulge in his right groin. He said the mass can become as big as his hand cupped. Today it is not very demonstrable. I fixed a left inguinal hernia on him in the 1990s. He has [name] well from the surgery. He has a small umbilical hernia that has been stable that we have been watching. The right inguinal hernia is bothering him and he is ready to get this fixed. His wife, Frank [name], fell and broke her neck while taking care of a dog. This required surgery. This happened a couple of months ago. Now she is waiting to have surgery again.  I discussed the indications and complications of hernia surgery with the patient. I discussed both the laparoscopic and open approach to hernia repair.. The potential risks of hernia surgery include, but are not limited to, bleeding, infection, open surgery, nerve injury, and recurrence of the hernia. I provided the patient literature about hernia surgery. We talked about the use of mesh in hernias and their risks. The risk of mesh include chronic infection, erosion to other structures, and chronic pain. His wife's issues with her neck are factoring into his plans,  but he wants to get the inguinal hernia fixed first, then his wife will get her surgery.  Plan: 1. Open right inguinal hernia repair  Review of Systems as stated in this history (HPI) or in the review of systems. Otherwise all other 12 point ROS are negative  Past Medical History: 1. HTN 2. Right renal calculus 3. Covid positive - 09/30/2019 He has had the Covid vaccine 4. Colonoscopy - 08/27/2016 - Frank [name] 5. Right knee arthroscopy - 07/2008 6. Open left inguinal hernia - 1990's - Frank [name] 7. Left foot surgery around 2015 8. [place] for hemorrhoids - 02/25/2008 - Frank [name] 9. Borderline DM - He has lost over 20 pounds this year. The DM is the impetus for his weight loss 10. Umbilical hernia  Social History: Married. Frank [name]. His wife fell and broke her neck while taking care of a dog. This required surgery. This happened a couple of months ago. Now she is waiting to have surgery again. Has 2 children: 17 and 12 yo His son has had 2 umbilical hernia operations and it sounds like he is about to get his third operation next week. Frank [name] is unsure who the surgeon is. [By looking at the notes - his son is Frank [name] and he has had inguinal hernia surgery - Frank [name] has him on the schedule for 07/24/2020]   Allergies Frank [name], RMA; 09/28/2020 2:12 PM) Penicillins  Allergies Reconciled   Medication History Frank [name], RMA; 09/28/2020 2:16 PM) amLODIPine Besylate (5MG  Tablet, Oral) Active. Aspirin (81MG  Tablet, Oral) Active. Calcium Carbonate (Oral) Specific strength unknown - Active. Chlorthalidone (25MG  Tablet,  Oral) Active. Cozaar (100MG  Tablet, Oral) Active. Fish Oil (Oral) Specific strength unknown - Active. Ginger (Oral) Specific strength unknown - Active. Glucosamine (Oral) Specific strength unknown - Active. Montelukast Sodium (10MG  Tablet, Oral) Active. Multivitamins (Oral) Active. Omeprazole (10MG  Capsule DR,  Oral) Active. Turmeric (Oral) Specific strength unknown - Active. Atorvastatin Calcium (Oral) Specific strength unknown - Active. Ezetimibe (10MG  Tablet, Oral) Active. Medications Reconciled  Vitals Frank [name] RMA; 09/28/2020 2:17 PM) 09/28/2020 2:16 PM Weight: 182 lb Height: 70in Body Surface Area: 2.01 m Body Mass Index: 26.11 kg/m  Temp.: 97.13F  Pulse: 79 (Regular)  P.OX: 96% (Room air) BP: 122/68(Sitting, Left Arm, Standard)   Physical Exam  General: WN older WM who is alert and generally healthy appearing. He has hearing aids and has decreased hearing. He is wearing a mask. HEENT: Normal. Pupils equal.  Neck: Supple. No mass. No thyroid mass.   Lungs: Clear to auscultation and symmetric breath sounds. Heart: RRR. No murmur or rub.  Abdomen: Soft. No mass. No tenderness. No hernia. Normal bowel sounds. Left inguinal incision. Small umbilical hernia that is reducible. The hernia bulges about 2 cm, but the fascial defect seems about 1.5 cm. On standing, he has a small to medium right inguinal hernia. The hernia reduces when he lays down.  Extremities: Good strength and ROM in upper and lower extremities.    Assessment & Plan  1.  INGUINAL HERNIA OF RIGHT SIDE WITHOUT OBSTRUCTION OR GANGRENE   Plan:  1. Open right inguinal hernia repair   Started Flomax 0.4 MG Oral Capsule, 1 (one) Capsule daily, #7, 7 days starting 09/28/2020, No Refill.  2.  UMBILICAL HERNIA WITHOUT OBSTRUCTION OR GANGRENE  3.  HEMORRHOIDS, EXTERNAL (K64.4) 4.  HYPERTENSION   5. Right renal calculus 6. Open left inguinal hernia - 1990's - Frank [name] 7. Borderline DM - He has lost over 20 pounds this year. The DM is the impetus for his weight loss   Frank Overall, MD, [facility] Office phone:  [phone]

## 2020-10-15 ENCOUNTER — Ambulatory Visit (HOSPITAL_COMMUNITY): Payer: PPO | Admitting: Anesthesiology

## 2020-10-15 ENCOUNTER — Encounter (HOSPITAL_COMMUNITY): Payer: Self-pay | Admitting: Surgery

## 2020-10-15 ENCOUNTER — Ambulatory Visit (HOSPITAL_COMMUNITY)
Admission: RE | Admit: 2020-10-15 | Discharge: 2020-10-15 | Disposition: A | Discharge status: Home / Self Care | Payer: PPO | Attending: Surgery | Admitting: Surgery

## 2020-10-15 ENCOUNTER — Other Ambulatory Visit: Payer: Self-pay

## 2020-10-15 ENCOUNTER — Encounter (HOSPITAL_COMMUNITY)
Admission: RE | Disposition: A | Discharge status: Home / Self Care | Payer: Self-pay | Source: Home / Self Care | Attending: Surgery

## 2020-10-15 DIAGNOSIS — Z79899 Other long term (current) drug therapy: Secondary | ICD-10-CM | POA: Insufficient documentation

## 2020-10-15 DIAGNOSIS — K4090 Unilateral inguinal hernia, without obstruction or gangrene, not specified as recurrent: Secondary | ICD-10-CM | POA: Diagnosis not present

## 2020-10-15 DIAGNOSIS — Z8616 Personal history of COVID-19: Secondary | ICD-10-CM | POA: Diagnosis not present

## 2020-10-15 DIAGNOSIS — Z7982 Long term (current) use of aspirin: Secondary | ICD-10-CM | POA: Diagnosis not present

## 2020-10-15 DIAGNOSIS — Z880 Allergy status to penicillin: Secondary | ICD-10-CM | POA: Diagnosis not present

## 2020-10-15 DIAGNOSIS — E7800 Pure hypercholesterolemia, unspecified: Secondary | ICD-10-CM | POA: Diagnosis not present

## 2020-10-15 DIAGNOSIS — G8918 Other acute postprocedural pain: Secondary | ICD-10-CM | POA: Diagnosis not present

## 2020-10-15 DIAGNOSIS — I10 Essential (primary) hypertension: Secondary | ICD-10-CM | POA: Diagnosis not present

## 2020-10-15 HISTORY — PX: INSERTION OF MESH: SHX5868

## 2020-10-15 HISTORY — PX: INGUINAL HERNIA REPAIR: SHX194

## 2020-10-15 HISTORY — DX: Unspecified osteoarthritis, unspecified site: M19.90

## 2020-10-15 LAB — CBC
HCT: 42.6 % (ref 39.0–52.0)
Hemoglobin: 14.1 g/dL (ref 13.0–17.0)
MCH: 29.7 pg (ref 26.0–34.0)
MCHC: 33.1 g/dL (ref 30.0–36.0)
MCV: 89.7 fL (ref 80.0–100.0)
Platelets: 245 10*3/uL (ref 150–400)
RBC: 4.75 MIL/uL (ref 4.22–5.81)
RDW: 13.7 % (ref 11.5–15.5)
WBC: 6.8 10*3/uL (ref 4.0–10.5)
nRBC: 0 % (ref 0.0–0.2)

## 2020-10-15 LAB — BASIC METABOLIC PANEL
Anion gap: 14 (ref 5–15)
BUN: 20 mg/dL (ref 8–23)
CO2: 23 mmol/L (ref 22–32)
Calcium: 9.3 mg/dL (ref 8.9–10.3)
Chloride: 103 mmol/L (ref 98–111)
Creatinine, Ser: 1.16 mg/dL (ref 0.61–1.24)
GFR, Estimated: >60 mL/min (ref >60)
Glucose, Bld: 94 mg/dL (ref 70–99)
Potassium: 3 mmol/L — ABNORMAL LOW (ref 3.5–5.1)
Sodium: 140 mmol/L (ref 135–145)

## 2020-10-15 LAB — GLUCOSE, CAPILLARY
Glucose-Capillary: 101 mg/dL — ABNORMAL HIGH (ref 70–99)
Glucose-Capillary: 89 mg/dL (ref 70–99)

## 2020-10-15 SURGERY — REPAIR, HERNIA, INGUINAL, ADULT
Anesthesia: Regional | Site: Inguinal | Laterality: Right

## 2020-10-15 MED ORDER — SUGAMMADEX SODIUM 200 MG/2ML IV SOLN
INTRAVENOUS | Status: DC | PRN
  Administered 2020-10-15: 200 mg via INTRAVENOUS

## 2020-10-15 MED ORDER — ORAL CARE MOUTH RINSE: 15.0000 mL | Freq: Once | OROMUCOSAL | Status: AC

## 2020-10-15 MED ORDER — ONDANSETRON HCL 4 MG/2ML IJ SOLN: 4.0000 mg | Freq: Once | INTRAMUSCULAR | Status: DC | PRN

## 2020-10-15 MED ORDER — MIDAZOLAM HCL 2 MG/2ML IJ SOLN: 1.0000 mg | Freq: Once | INTRAMUSCULAR | Status: AC

## 2020-10-15 MED ORDER — FENTANYL CITRATE (PF) 100 MCG/2ML IJ SOLN: 50.0000 ug | Freq: Once | INTRAMUSCULAR | Status: AC

## 2020-10-15 MED ORDER — ACETAMINOPHEN 500 MG PO TABS
ORAL_TABLET | ORAL | Status: AC
  Administered 2020-10-15: 1000 mg via ORAL
  Filled 2020-10-15: qty 2

## 2020-10-15 MED ORDER — LIDOCAINE 2% (20 MG/ML) 5 ML SYRINGE
INTRAMUSCULAR | Status: AC
  Filled 2020-10-15: qty 5

## 2020-10-15 MED ORDER — DEXAMETHASONE SODIUM PHOSPHATE 10 MG/ML IJ SOLN
INTRAMUSCULAR | Status: DC | PRN
  Administered 2020-10-15: 5 mg via INTRAVENOUS

## 2020-10-15 MED ORDER — DEXAMETHASONE SODIUM PHOSPHATE 10 MG/ML IJ SOLN
INTRAMUSCULAR | Status: AC
  Filled 2020-10-15: qty 1

## 2020-10-15 MED ORDER — MIDAZOLAM HCL 2 MG/2ML IJ SOLN
INTRAMUSCULAR | Status: AC
  Filled 2020-10-15: qty 2

## 2020-10-15 MED ORDER — MIDAZOLAM HCL 2 MG/2ML IJ SOLN
INTRAMUSCULAR | Status: AC
  Administered 2020-10-15: 1 mg via INTRAVENOUS
  Filled 2020-10-15: qty 2

## 2020-10-15 MED ORDER — CHLORHEXIDINE GLUCONATE 4 % EX LIQD: 60.0000 mL | Freq: Once | CUTANEOUS | Status: DC

## 2020-10-15 MED ORDER — PHENYLEPHRINE 40 MCG/ML (10ML) SYRINGE FOR IV PUSH (FOR BLOOD PRESSURE SUPPORT)
PREFILLED_SYRINGE | INTRAVENOUS | Status: DC | PRN
  Administered 2020-10-15: 160 ug via INTRAVENOUS
  Administered 2020-10-15 (×2): 120 ug via INTRAVENOUS

## 2020-10-15 MED ORDER — FENTANYL CITRATE (PF) 250 MCG/5ML IJ SOLN
INTRAMUSCULAR | Status: AC
  Filled 2020-10-15: qty 5

## 2020-10-15 MED ORDER — ROCURONIUM BROMIDE 10 MG/ML (PF) SYRINGE
PREFILLED_SYRINGE | INTRAVENOUS | Status: AC
  Filled 2020-10-15: qty 10

## 2020-10-15 MED ORDER — OXYCODONE HCL 5 MG PO TABS
5.0000 mg | ORAL_TABLET | Freq: Once | ORAL | Status: AC | PRN
  Administered 2020-10-15: 5 mg via ORAL

## 2020-10-15 MED ORDER — LACTATED RINGERS IV SOLN: INTRAVENOUS | Status: DC

## 2020-10-15 MED ORDER — PROPOFOL 10 MG/ML IV BOLUS
INTRAVENOUS | Status: AC
  Filled 2020-10-15: qty 20

## 2020-10-15 MED ORDER — ACETAMINOPHEN 500 MG PO TABS: 1000.0000 mg | ORAL_TABLET | ORAL | Status: AC

## 2020-10-15 MED ORDER — BUPIVACAINE HCL (PF) 0.25 % IJ SOLN
INTRAMUSCULAR | Status: DC | PRN
  Administered 2020-10-15: 20 mL

## 2020-10-15 MED ORDER — CHLORHEXIDINE GLUCONATE 0.12 % MT SOLN: 15.0000 mL | Freq: Once | OROMUCOSAL | Status: AC

## 2020-10-15 MED ORDER — EPHEDRINE 5 MG/ML INJ
INTRAVENOUS | Status: AC
  Filled 2020-10-15: qty 10

## 2020-10-15 MED ORDER — CEFAZOLIN SODIUM-DEXTROSE 2-4 GM/100ML-% IV SOLN
2.0000 g | INTRAVENOUS | Status: AC
  Administered 2020-10-15: 2 g via INTRAVENOUS

## 2020-10-15 MED ORDER — FENTANYL CITRATE (PF) 100 MCG/2ML IJ SOLN
INTRAMUSCULAR | Status: DC | PRN
  Administered 2020-10-15: 50 ug via INTRAVENOUS

## 2020-10-15 MED ORDER — OXYCODONE HCL 5 MG/5ML PO SOLN: 5.0000 mg | Freq: Once | ORAL | Status: AC | PRN

## 2020-10-15 MED ORDER — BUPIVACAINE HCL (PF) 0.5 % IJ SOLN
INTRAMUSCULAR | Status: DC | PRN
  Administered 2020-10-15: 20 mL

## 2020-10-15 MED ORDER — ONDANSETRON HCL 4 MG/2ML IJ SOLN
INTRAMUSCULAR | Status: AC
  Filled 2020-10-15: qty 2

## 2020-10-15 MED ORDER — CEFAZOLIN SODIUM-DEXTROSE 2-4 GM/100ML-% IV SOLN
INTRAVENOUS | Status: AC
  Filled 2020-10-15: qty 100

## 2020-10-15 MED ORDER — PHENYLEPHRINE 40 MCG/ML (10ML) SYRINGE FOR IV PUSH (FOR BLOOD PRESSURE SUPPORT)
PREFILLED_SYRINGE | INTRAVENOUS | Status: AC
  Filled 2020-10-15: qty 10

## 2020-10-15 MED ORDER — ROCURONIUM BROMIDE 10 MG/ML (PF) SYRINGE
PREFILLED_SYRINGE | INTRAVENOUS | Status: DC | PRN
  Administered 2020-10-15: 20 mg via INTRAVENOUS
  Administered 2020-10-15: 10 mg via INTRAVENOUS
  Administered 2020-10-15: 70 mg via INTRAVENOUS

## 2020-10-15 MED ORDER — MIDAZOLAM HCL 2 MG/2ML IJ SOLN
INTRAMUSCULAR | Status: DC | PRN
  Administered 2020-10-15: 1 mg via INTRAVENOUS

## 2020-10-15 MED ORDER — FENTANYL CITRATE (PF) 100 MCG/2ML IJ SOLN
INTRAMUSCULAR | Status: AC
  Administered 2020-10-15: 50 ug via INTRAVENOUS
  Filled 2020-10-15: qty 2

## 2020-10-15 MED ORDER — OXYCODONE HCL 5 MG PO TABS
ORAL_TABLET | ORAL | Status: AC
  Filled 2020-10-15: qty 1

## 2020-10-15 MED ORDER — HYDROCODONE-ACETAMINOPHEN 5-325 MG PO TABS: 1.0000 | ORAL_TABLET | Freq: Four times a day (QID) | ORAL | 0 refills | Status: DC | PRN

## 2020-10-15 MED ORDER — CHLORHEXIDINE GLUCONATE 0.12 % MT SOLN
OROMUCOSAL | Status: AC
  Administered 2020-10-15: 15 mL via OROMUCOSAL
  Filled 2020-10-15: qty 15

## 2020-10-15 MED ORDER — EPHEDRINE SULFATE-NACL 50-0.9 MG/10ML-% IV SOSY
PREFILLED_SYRINGE | INTRAVENOUS | Status: DC | PRN
  Administered 2020-10-15 (×2): 15 mg via INTRAVENOUS
  Administered 2020-10-15 (×2): 10 mg via INTRAVENOUS

## 2020-10-15 MED ORDER — PROPOFOL 10 MG/ML IV BOLUS
INTRAVENOUS | Status: DC | PRN
  Administered 2020-10-15: 150 mg via INTRAVENOUS

## 2020-10-15 MED ORDER — BUPIVACAINE HCL (PF) 0.25 % IJ SOLN
INTRAMUSCULAR | Status: AC
  Filled 2020-10-15: qty 30

## 2020-10-15 MED ORDER — 0.9 % SODIUM CHLORIDE (POUR BTL) OPTIME
TOPICAL | Status: DC | PRN
  Administered 2020-10-15: 1000 mL

## 2020-10-15 MED ORDER — FENTANYL CITRATE (PF) 100 MCG/2ML IJ SOLN: 25.0000 ug | INTRAMUSCULAR | Status: DC | PRN

## 2020-10-15 MED ORDER — ONDANSETRON HCL 4 MG/2ML IJ SOLN
INTRAMUSCULAR | Status: DC | PRN
  Administered 2020-10-15: 4 mg via INTRAVENOUS

## 2020-10-15 MED ORDER — LIDOCAINE 2% (20 MG/ML) 5 ML SYRINGE
INTRAMUSCULAR | Status: DC | PRN
  Administered 2020-10-15: 80 mg via INTRAVENOUS

## 2020-10-15 SURGICAL SUPPLY — 42 items
ADH SKN CLS APL DERMABOND .7 (GAUZE/BANDAGES/DRESSINGS) ×1
APL PRP STRL LF DISP 70% ISPRP (MISCELLANEOUS) ×1
BLADE CLIPPER SURG (BLADE) ×2 IMPLANT
CHLORAPREP W/TINT 26 (MISCELLANEOUS) ×3 IMPLANT
COVER SURGICAL LIGHT HANDLE (MISCELLANEOUS) ×3 IMPLANT
COVER WAND RF STERILE (DRAPES) ×1 IMPLANT
DECANTER SPIKE VIAL GLASS SM (MISCELLANEOUS) ×3 IMPLANT
DERMABOND ADVANCED (GAUZE/BANDAGES/DRESSINGS) ×2
DERMABOND ADVANCED .7 DNX12 (GAUZE/BANDAGES/DRESSINGS) ×1 IMPLANT
DRAIN PENROSE 1/2X12 LTX STRL (WOUND CARE) ×2 IMPLANT
DRAPE LAPAROTOMY TRNSV 102X78 (DRAPES) ×3 IMPLANT
ELECT CAUTERY BLADE 6.4 (BLADE) ×3 IMPLANT
ELECT REM PT RETURN 9FT ADLT (ELECTROSURGICAL) ×3
ELECTRODE REM PT RTRN 9FT ADLT (ELECTROSURGICAL) ×1 IMPLANT
GLOVE SURG SYN 7.5  E (GLOVE) ×3
GLOVE SURG SYN 7.5 E (GLOVE) ×1 IMPLANT
GLOVE SURG SYN 7.5 PF PI (GLOVE) ×1 IMPLANT
GOWN STRL REUS W/ TWL LRG LVL3 (GOWN DISPOSABLE) ×1 IMPLANT
GOWN STRL REUS W/ TWL XL LVL3 (GOWN DISPOSABLE) ×1 IMPLANT
GOWN STRL REUS W/TWL LRG LVL3 (GOWN DISPOSABLE) ×3
GOWN STRL REUS W/TWL XL LVL3 (GOWN DISPOSABLE) ×3
KIT BASIN OR (CUSTOM PROCEDURE TRAY) ×3 IMPLANT
KIT TURNOVER KIT B (KITS) ×3 IMPLANT
MESH ULTRAPRO 3X6 7.6X15CM (Mesh General) ×2 IMPLANT
NDL HYPO 25GX1X1/2 BEV (NEEDLE) ×1 IMPLANT
NEEDLE HYPO 25GX1X1/2 BEV (NEEDLE) ×3 IMPLANT
NS IRRIG 1000ML POUR BTL (IV SOLUTION) ×3 IMPLANT
PACK GENERAL/GYN (CUSTOM PROCEDURE TRAY) ×3 IMPLANT
PAD ARMBOARD 7.5X6 YLW CONV (MISCELLANEOUS) ×3 IMPLANT
PENCIL SMOKE EVACUATOR (MISCELLANEOUS) ×3 IMPLANT
SPECIMEN JAR SMALL (MISCELLANEOUS) IMPLANT
SPONGE INTESTINAL PEANUT (DISPOSABLE) ×3 IMPLANT
SUT MNCRL AB 4-0 PS2 18 (SUTURE) ×3 IMPLANT
SUT NOVA 0 T19/GS 22DT (SUTURE) ×5 IMPLANT
SUT NOVA NAB GS-21 0 18 T12 DT (SUTURE) ×2 IMPLANT
SUT VIC AB 2-0 SH 27 (SUTURE) ×3
SUT VIC AB 2-0 SH 27X BRD (SUTURE) ×1 IMPLANT
SUT VIC AB 3-0 SH 18 (SUTURE) ×5 IMPLANT
SUT VICRYL AB 2 0 TIES (SUTURE) ×3 IMPLANT
SYR CONTROL 10ML LL (SYRINGE) ×3 IMPLANT
TOWEL GREEN STERILE (TOWEL DISPOSABLE) ×3 IMPLANT
TOWEL GREEN STERILE FF (TOWEL DISPOSABLE) ×3 IMPLANT

## 2020-10-15 NOTE — Op Note (Signed)
OPERATIVE NOTE  10/15/2020  5:24 PM  PATIENT:  Frank [name], 69 y.o., male, MRN: [id number]  PREOP DIAGNOSIS:  RIGHT INGUINAL HERNIA  POSTOP DIAGNOSIS:   Indirect right inguinal hernia  PROCEDURE:   Procedure(s):   OPEN RIGHT INGUINAL HERNIA REPAIR WITH MESH, INSERTION OF MESH  SURGEON:   Alphonsa Overall, M.D.  ASSISTANT:   None  ANESTHESIA:   general  Anesthesiologist: Merlinda Frederick, MD CRNA: Barrington Ellison, CRNA  General  EBL:  Minimal  ml  BLOOD ADMINISTERED: none  DRAINS: none   LOCAL MEDICATIONS USED:   Right groin TAP block by anesthesia, 20 cc of 1/4% marcaine injected by me  SPECIMEN:   None  COUNTS CORRECT:  YES  INDICATIONS FOR PROCEDURE:  Frank [name] is a 69 y.o. (DOB: 11-26-1951) white male whose primary care physician is Asencion Noble, MD and comes for open repair of right inguinal hernia.   The indications and risks of the surgery were explained to the patient.  The risks include, but are not limited to, infection, bleeding, and nerve injury.   He was given a right inguinal TAP block by anesthesia prior to the operation.  Operative Note: The patient was taken OR to room number 2 at [facility].  He underwent a general anesthesia.    A time out was held and the surgical checklist run.  His lower abdomen was shaved and then prepped with chloroprep.  A right inguinal incision was made through the subcutaneous fat to the external oblique fascia.  The external ring was opened and the cord structures encircled with a penrose drain.  The patient had a indirect right inguinal hernia.    I identified the ileo inguinal nerve and preserved this during the dissection.  The patient had a hernia sac the came along the anterior medial portion of the cord structures.  The sac was dissected free of the cord structures.  The sac was fairly broad based, about 3 cm.  The sac protruded about 7 cm.  The sac was opened and I introduced my finger into the peritoneal cavity.   There was no palpable mass within the peritoneal cavity.  The sac was ligated with a 2-0 Vicryl suture.  The inguinal floor was weak, but the primary hernia was the indirect component.  The inguinal floor was repaired with a 3 x 6 inch piece of Ultrapro mesh.  The mesh was cut to fit the inguinal floor.  The mesh was sewn in place with interrupted 0 Novafil suture.  I sewed the mesh medially to the pubic tubercle, inferiorly to the shelving edge of the inguinal ligament, and superiorly to the transversalis fascia.   A key hole was made for the internal ring.  The mesh lay flat.  The inguinal floor was covered and the internal ring recreated.  The cord structures were returned to a normal location.  The external oblique was closed with a 3-0 vicryl.  The fascia and subcutaneous tissues were infiltrated with 1/4% marcaine plain.  The skin was closed with 4-0 monocryl and painted with DermaBond. The sponge and needle count were correct at the end of the case.  The patient was transported to the recovery room in good condition.  The patient will go home today.  Discharge instructions reviewed.  Alphonsa Overall, MD, [facility] Pager: [phone] Office phone:  [phone]

## 2020-10-15 NOTE — Interval H&P Note (Signed)
History and Physical Interval Note:  10/15/2020 3:21 PM  Frank [name]  has presented today for surgery, with the diagnosis of RIGHT INGUINAL HERNIA.  The various methods of treatment have been discussed with the patient and family.   His wife is here with him.  They are to celebrate their 50th anniversary next week.  After consideration of risks, benefits and other options for treatment, the patient has consented to  Procedure(s): OPEN RIGHT INGUINAL HERNIA REPAIR WITH MESH (Right) as a surgical intervention.  The patient's history has been reviewed, patient examined, no change in status, stable for surgery.  I have reviewed the patient's chart and labs.  Questions were answered to the patient's satisfaction.     Shann Medal

## 2020-10-15 NOTE — Discharge Instructions (Signed)
[facility] - DISCHARGE INSTRUCTIONS TO PATIENT  Activity:  Driving - May drive in 2 to 4 days, if doing well and off pain meds   Lifting - No lifting more than 15 pounds for 4 weeks.                       Practice your Covid-19 protection:  Wear a mask, social distance, and wash your hands frequently  Wound Care:   Leave the incision dry for 2 days, then you may shower  Diet:  As tolerated  Follow up appointment:  Call Dr. Pollie Friar office [facility]) at [phone] for an appointment in 2 to 3 weeks..  Medications and dosages:  Resume your home medications.  You have a prescription for:  Vicodin.             Start your Flomax prescription.  If voiding well, you may stop this after 4 days.             You may also take Tylenol, ibuprofen, or Aleve for pain  Call Dr. Lucia Gaskins or his office  [phone]) if you have:  Temperature greater than 100.4,  Persistent nausea and vomiting,  Severe uncontrolled pain,  Redness, tenderness, or signs of infection (pain, swelling, redness, odor or green/yellow discharge around the site),  Difficulty breathing, headache or visual disturbances,  Any other questions or concerns you may have after discharge.  In an emergency, call 911 or go to an Emergency Department at a nearby hospital.

## 2020-10-15 NOTE — Anesthesia Procedure Notes (Signed)
Procedure Name: Intubation Date/Time: 10/15/2020 3:51 PM Performed by: Barrington Ellison, CRNA Pre-anesthesia Checklist: Patient identified, Emergency Drugs available, Suction available and Patient being monitored Patient Re-evaluated:Patient Re-evaluated prior to induction Oxygen Delivery Method: Circle System Utilized Preoxygenation: Pre-oxygenation with 100% oxygen Induction Type: IV induction Ventilation: Mask ventilation without difficulty Laryngoscope Size: Mac and 4 Grade View: Grade I Tube type: Oral Tube size: 7.5 mm Number of attempts: 1 Airway Equipment and Method: Stylet and Oral airway Placement Confirmation: ETT inserted through vocal cords under direct vision,  positive ETCO2 and breath sounds checked- equal and bilateral Secured at: 22 cm Tube secured with: Tape Dental Injury: Teeth and Oropharynx as per pre-operative assessment

## 2020-10-15 NOTE — Anesthesia Preprocedure Evaluation (Signed)
Anesthesia Evaluation  Patient identified by MRN, date of birth, ID band Patient awake    Reviewed: Allergy & Precautions, NPO status , Patient's Chart, lab work & pertinent test results  History of Anesthesia Complications Negative for: history of anesthetic complications  Airway Mallampati: II  TM Distance: >3 FB Neck ROM: Full    Dental no notable dental hx.    Pulmonary neg pulmonary ROS,    Pulmonary exam normal breath sounds clear to auscultation       Cardiovascular Exercise Tolerance: Good hypertension, Normal cardiovascular exam Rhythm:Regular Rate:Normal     Neuro/Psych Bilateral hearing loss negative psych ROS   GI/Hepatic Neg liver ROS, GERD  ,  Endo/Other    Renal/GU negative Renal ROS  negative genitourinary   Musculoskeletal  (+) Arthritis , Osteoarthritis,    Abdominal   Peds negative pediatric ROS (+)  Hematology negative hematology ROS (+)   Anesthesia Other Findings   Reproductive/Obstetrics negative OB ROS                             Anesthesia Physical Anesthesia Plan  ASA: II  Anesthesia Plan: General and Regional   Post-op Pain Management: GA combined w/ Regional for post-op pain   Induction: Intravenous  PONV Risk Score and Plan: 2 and Midazolam, Dexamethasone and Ondansetron  Airway Management Planned: Oral ETT  Additional Equipment:   Intra-op Plan:   Post-operative Plan: Extubation in OR  Informed Consent: I have reviewed the patients History and Physical, chart, labs and discussed the procedure including the risks, benefits and alternatives for the proposed anesthesia with the patient or authorized representative who has indicated his/her understanding and acceptance.       Plan Discussed with: CRNA, Anesthesiologist and Surgeon  Anesthesia Plan Comments: (TAP block with Exparel. GETA)        Anesthesia Quick Evaluation

## 2020-10-15 NOTE — Transfer of Care (Signed)
Immediate Anesthesia Transfer of Care Note  Patient: Frank [name]  Procedure(s) Performed: OPEN RIGHT INGUINAL HERNIA REPAIR WITH MESH (Right Inguinal) INSERTION OF MESH (Right Inguinal)  Patient Location: PACU  Anesthesia Type:General and Regional  Level of Consciousness: drowsy and patient cooperative  Airway & Oxygen Therapy: Patient Spontanous Breathing  Post-op Assessment: Report given to RN  Post vital signs: Reviewed and stable  Last Vitals:  Vitals Value Taken Time  BP 128/77 10/15/20 1732  Temp    Pulse 88 10/15/20 1732  Resp 20 10/15/20 1732  SpO2 95 % 10/15/20 1732  Vitals shown include unvalidated device data.  Last Pain:  Vitals:   10/15/20 1331  TempSrc:   PainSc: 0-No pain      Patients Stated Pain Goal: 5 (14/10/30 1314)  Complications: No complications documented.

## 2020-10-15 NOTE — Anesthesia Procedure Notes (Signed)
Anesthesia Regional Block: TAP block   Pre-Anesthetic Checklist: ,, timeout performed, Correct Patient, Correct Site, Correct Laterality, Correct Procedure, Correct Position, site marked, Risks and benefits discussed,  Surgical consent,  Pre-op evaluation,  At surgeon's request and post-op pain management  Laterality: Right  Prep: chloraprep       Needles:  Injection technique: Single-shot  Needle Type: Echogenic Stimulator Needle          Additional Needles:   Procedures:,,,, ultrasound used (permanent image in chart),,,,  Narrative:  Start time: 10/15/2020 2:10 PM End time: 10/15/2020 2:15 PM Injection made incrementally with aspirations every 5 mL.  Performed by: Personally  Anesthesiologist: Merlinda Frederick, MD  Additional Notes: A functioning IV was confirmed and monitors were applied.  Sterile prep and drape, hand hygiene and sterile gloves were used.  Negative aspiration and test dose prior to incremental administration of local anesthetic. The patient tolerated the procedure well.Ultrasound  guidance: relevant anatomy identified, needle position confirmed, local anesthetic spread visualized around nerve(s), vascular puncture avoided.  Image printed for medical record.

## 2020-10-16 ENCOUNTER — Encounter (HOSPITAL_COMMUNITY): Payer: Self-pay | Admitting: Surgery

## 2020-10-16 NOTE — Anesthesia Postprocedure Evaluation (Signed)
Anesthesia Post Note  Patient: Frank [name]  Procedure(s) Performed: OPEN RIGHT INGUINAL HERNIA REPAIR WITH MESH (Right Inguinal) INSERTION OF MESH (Right Inguinal)     Patient location during evaluation: PACU Anesthesia Type: Regional Level of consciousness: awake Pain management: pain level controlled Vital Signs Assessment: post-procedure vital signs reviewed and stable Respiratory status: spontaneous breathing and respiratory function stable Cardiovascular status: stable Postop Assessment: no apparent nausea or vomiting Anesthetic complications: no   No complications documented.  Last Vitals:  Vitals:   10/15/20 1832 10/15/20 1847  BP: 113/81 124/76  Pulse: 80 82  Resp: 12 12  Temp:    SpO2: 98% 92%    Last Pain:  Vitals:   10/15/20 1847  TempSrc:   PainSc: [place]

## 2020-11-19 DIAGNOSIS — E785 Hyperlipidemia, unspecified: Secondary | ICD-10-CM | POA: Diagnosis not present

## 2020-11-19 DIAGNOSIS — E1129 Type 2 diabetes mellitus with other diabetic kidney complication: Secondary | ICD-10-CM | POA: Diagnosis not present

## 2020-11-19 DIAGNOSIS — Z79899 Other long term (current) drug therapy: Secondary | ICD-10-CM | POA: Diagnosis not present

## 2020-11-26 DIAGNOSIS — E1122 Type 2 diabetes mellitus with diabetic chronic kidney disease: Secondary | ICD-10-CM | POA: Diagnosis not present

## 2020-11-26 DIAGNOSIS — Z23 Encounter for immunization: Secondary | ICD-10-CM | POA: Diagnosis not present

## 2020-11-26 DIAGNOSIS — R7309 Other abnormal glucose: Secondary | ICD-10-CM | POA: Diagnosis not present

## 2020-11-26 DIAGNOSIS — N183 Chronic kidney disease, stage 3 unspecified: Secondary | ICD-10-CM | POA: Diagnosis not present

## 2020-12-27 DIAGNOSIS — D225 Melanocytic nevi of trunk: Secondary | ICD-10-CM | POA: Diagnosis not present

## 2020-12-27 DIAGNOSIS — L814 Other melanin hyperpigmentation: Secondary | ICD-10-CM | POA: Diagnosis not present

## 2020-12-27 DIAGNOSIS — L918 Other hypertrophic disorders of the skin: Secondary | ICD-10-CM | POA: Diagnosis not present

## 2020-12-27 DIAGNOSIS — L578 Other skin changes due to chronic exposure to nonionizing radiation: Secondary | ICD-10-CM | POA: Diagnosis not present

## 2020-12-27 DIAGNOSIS — L821 Other seborrheic keratosis: Secondary | ICD-10-CM | POA: Diagnosis not present

## 2020-12-27 DIAGNOSIS — L723 Sebaceous cyst: Secondary | ICD-10-CM | POA: Diagnosis not present

## 2020-12-27 DIAGNOSIS — D1801 Hemangioma of skin and subcutaneous tissue: Secondary | ICD-10-CM | POA: Diagnosis not present

## 2020-12-27 DIAGNOSIS — B353 Tinea pedis: Secondary | ICD-10-CM | POA: Diagnosis not present

## 2020-12-27 DIAGNOSIS — L219 Seborrheic dermatitis, unspecified: Secondary | ICD-10-CM | POA: Diagnosis not present

## 2021-07-17 DIAGNOSIS — I10 Essential (primary) hypertension: Secondary | ICD-10-CM | POA: Diagnosis not present

## 2021-07-17 DIAGNOSIS — E785 Hyperlipidemia, unspecified: Secondary | ICD-10-CM | POA: Diagnosis not present

## 2021-07-17 DIAGNOSIS — K219 Gastro-esophageal reflux disease without esophagitis: Secondary | ICD-10-CM | POA: Diagnosis not present

## 2021-07-17 DIAGNOSIS — Z79899 Other long term (current) drug therapy: Secondary | ICD-10-CM | POA: Diagnosis not present

## 2021-07-17 DIAGNOSIS — N183 Chronic kidney disease, stage 3 unspecified: Secondary | ICD-10-CM | POA: Diagnosis not present

## 2021-07-17 DIAGNOSIS — E876 Hypokalemia: Secondary | ICD-10-CM | POA: Diagnosis not present

## 2021-07-17 DIAGNOSIS — Z125 Encounter for screening for malignant neoplasm of prostate: Secondary | ICD-10-CM | POA: Diagnosis not present

## 2021-07-17 DIAGNOSIS — E1129 Type 2 diabetes mellitus with other diabetic kidney complication: Secondary | ICD-10-CM | POA: Diagnosis not present

## 2021-07-23 DIAGNOSIS — I10 Essential (primary) hypertension: Secondary | ICD-10-CM | POA: Diagnosis not present

## 2021-07-23 DIAGNOSIS — E785 Hyperlipidemia, unspecified: Secondary | ICD-10-CM | POA: Diagnosis not present

## 2021-07-23 DIAGNOSIS — G720 Drug-induced myopathy: Secondary | ICD-10-CM | POA: Diagnosis not present

## 2021-07-23 DIAGNOSIS — I700 Atherosclerosis of aorta: Secondary | ICD-10-CM | POA: Diagnosis not present

## 2021-07-23 DIAGNOSIS — Z6828 Body mass index (BMI) 28.0-28.9, adult: Secondary | ICD-10-CM | POA: Diagnosis not present

## 2021-07-23 DIAGNOSIS — E1122 Type 2 diabetes mellitus with diabetic chronic kidney disease: Secondary | ICD-10-CM | POA: Diagnosis not present

## 2021-07-23 DIAGNOSIS — R7309 Other abnormal glucose: Secondary | ICD-10-CM | POA: Diagnosis not present

## 2021-07-24 ENCOUNTER — Encounter (INDEPENDENT_AMBULATORY_CARE_PROVIDER_SITE_OTHER): Payer: Self-pay | Admitting: *Deleted

## 2021-07-30 ENCOUNTER — Encounter (INDEPENDENT_AMBULATORY_CARE_PROVIDER_SITE_OTHER): Payer: Self-pay | Admitting: *Deleted

## 2021-09-19 DIAGNOSIS — H90A32 Mixed conductive and sensorineural hearing loss, unilateral, left ear with restricted hearing on the contralateral side: Secondary | ICD-10-CM | POA: Diagnosis not present

## 2021-09-19 DIAGNOSIS — Z9089 Acquired absence of other organs: Secondary | ICD-10-CM | POA: Diagnosis not present

## 2021-09-19 DIAGNOSIS — H7493 Unspecified disorder of middle ear and mastoid, bilateral: Secondary | ICD-10-CM | POA: Diagnosis not present

## 2021-09-19 DIAGNOSIS — H906 Mixed conductive and sensorineural hearing loss, bilateral: Secondary | ICD-10-CM | POA: Diagnosis not present

## 2021-09-19 DIAGNOSIS — J302 Other seasonal allergic rhinitis: Secondary | ICD-10-CM | POA: Diagnosis not present

## 2021-09-19 DIAGNOSIS — Z9889 Other specified postprocedural states: Secondary | ICD-10-CM | POA: Diagnosis not present

## 2021-09-19 DIAGNOSIS — Z974 Presence of external hearing-aid: Secondary | ICD-10-CM | POA: Diagnosis not present

## 2021-10-26 ENCOUNTER — Other Ambulatory Visit: Payer: Self-pay

## 2021-10-26 ENCOUNTER — Ambulatory Visit (HOSPITAL_COMMUNITY)
Admission: EM | Admit: 2021-10-26 | Discharge: 2021-10-26 | Disposition: A | Discharge status: Home / Self Care | Payer: PPO | Attending: Internal Medicine | Admitting: Internal Medicine

## 2021-10-26 DIAGNOSIS — J069 Acute upper respiratory infection, unspecified: Secondary | ICD-10-CM

## 2021-10-26 MED ORDER — BENZONATATE 100 MG PO CAPS: 100.0000 mg | ORAL_CAPSULE | Freq: Three times a day (TID) | ORAL | 0 refills | Status: DC | PRN

## 2021-10-26 NOTE — ED Triage Notes (Signed)
Patient complains of fever and cough x 3 days.

## 2021-10-26 NOTE — Discharge Instructions (Addendum)
Maintain adequate hydration Take medications as prescribed There is no indication for COVID or flu testing given the duration of your symptoms Return to urgent care if your symptoms worsen or if you have any further concerns.

## 2021-10-31 NOTE — ED Provider Notes (Signed)
[place]    CSN: [id number] Arrival date & time: 10/26/21  1536      History   Chief Complaint Chief Complaint  Patient presents with   Cough   Fever    HPI Frank [name] is a 70 y.o. male comes to the urgent care with 3 days history of fever, chills and nonproductive cough.  Patient symptoms started insidiously and has been persistent.  He denies any sick contacts.  Patient has low-grade fever but no chills.  Symptoms are associated with generalized body aches.  No nausea or vomiting.  No diarrhea.  No abdominal pain.  No vomiting or diarrhea.  No shortness of breath or wheezing.   HPI  Past Medical History:  Diagnosis Date   Arthritis    Barrett's esophagus    BPH (benign prostatic hyperplasia)    Fracture of 5th metatarsal    GERD (gastroesophageal reflux disease)    Hypercholesteremia    Hypertension    Mastoiditis    Ulcer disease    Bulbar    Patient Active Problem List   Diagnosis Date Noted   Left foot pain 02/14/2014   Fracture of 5th metatarsal    BPH (benign prostatic hyperplasia)    Mastoiditis    Hypertension    Barrett's esophagus    Ulcer disease    GERD (gastroesophageal reflux disease) 05/24/2012   HTN (hypertension) 05/24/2012   History of colonic polyps 05/24/2012    Past Surgical History:  Procedure Laterality Date   COLONOSCOPY     COLONOSCOPY     COLONOSCOPY N/A 08/27/2016   Procedure: COLONOSCOPY;  Surgeon: Rogene Houston, MD;  [facility];  Service: Endoscopy;  Laterality: N/A;  930 - moved to 9/27 @ 12:00 - Ann notified pt   HEMORRHOID SURGERY  2009   INGUINAL HERNIA REPAIR Right 10/15/2020   Procedure: OPEN RIGHT INGUINAL HERNIA REPAIR WITH MESH;  Surgeon: Alphonsa Overall, MD;  Location: [place];  Service: General;  Laterality: Right;   INSERTION OF MESH Right 10/15/2020   Procedure: INSERTION OF MESH;  Surgeon: Alphonsa Overall, MD;  Location: [place];  Service: General;  Laterality: Right;   KNEE ARTHROSCOPY      Right Knee   ORIF TOE FRACTURE Left 12/09/2013   Procedure: LEFT OPEN REDUCTION INTERNAL FIXATION (ORIF) FIFTH METATARSAL FRACTURE;  Surgeon: Lorn Junes, MD;  Location: [place];  Service: Orthopedics;  Laterality: Left;   POLYPECTOMY  08/27/2016   Procedure: POLYPECTOMY;  Surgeon: Rogene Houston, MD;  [facility];  Service: Endoscopy;;  colon   rotor cuff  2009   right-   TONSILLECTOMY     UPPER GASTROINTESTINAL ENDOSCOPY         Home Medications    Prior to Admission medications   Medication Sig Start Date End Date Taking? Authorizing Provider  benzonatate (TESSALON) 100 MG capsule Take 1 capsule (100 mg total) by mouth 3 (three) times daily as needed for cough. 10/26/21  Yes Diar Berkel, Myrene Galas, MD  amLODipine (NORVASC) 5 MG tablet Take 5 mg by mouth daily.    [provider]  aspirin EC 81 MG tablet Take 81 mg by mouth daily. Swallow whole.    [provider]  Calcium Carb-Cholecalciferol (CALCIUM 600 + D PO) Take 1 tablet by mouth daily.    [provider]  chlorthalidone (HYGROTON) 25 MG tablet  02/23/19   [provider]  ezetimibe (ZETIA) 10 MG tablet Take 10  mg by mouth daily. 08/04/20   [provider]  Ginger, Zingiber officinalis, (GINGER PO) Take 1 capsule by mouth daily.    [provider]  Glucosamine HCl 1000 MG TABS Take 2,000 mg by mouth daily.    [provider]  HYDROcodone-acetaminophen (NORCO/VICODIN) 5-325 MG tablet Take 1 tablet by mouth every 6 (six) hours as needed for moderate pain. 10/15/20   Alphonsa Overall, MD  losartan (COZAAR) 100 MG tablet Take 100 mg by mouth daily.    [provider]  Misc Natural Products (TART CHERRY ADVANCED PO) Take by mouth.    [provider]  Multiple Vitamin (MULTIVITAMIN WITH MINERALS) TABS tablet Take 1 tablet by mouth daily.    [provider]  Omega-3 Fatty Acids (FISH OIL) 1000 MG CAPS Take 1,000 mg by  mouth daily.    [provider]  omeprazole (PRILOSEC) 20 MG capsule Take 20 mg by mouth daily.  04/04/19   [provider]  Polyethyl Glycol-Propyl Glycol (LUBRICANT EYE DROPS) 0.4-0.3 % SOLN Place 1 drop into both eyes 3 (three) times daily as needed (dry/irritated eyes.).    [provider]  TURMERIC PO Take 1 capsule by mouth daily.    [provider]    Family History Family History  Problem Relation Age of Onset   COPD Mother    Heart disease Father    Healthy Sister    Lung cancer Brother    Heart disease Brother    Heart disease Sister    Prostate cancer Brother    Healthy Son    Healthy Son     Social History Social History   Tobacco Use   Smoking status: Never   Smokeless tobacco: Never  Substance Use Topics   Alcohol use: No   Drug use: No     Allergies   Ampicillin   Review of Systems Review of Systems  Constitutional:  Positive for fever. Negative for chills and fatigue.  HENT:  Positive for congestion.   Eyes: Negative.   Respiratory:  Positive for cough. Negative for shortness of breath and wheezing.   Cardiovascular: Negative.   Gastrointestinal: Negative.   Musculoskeletal:  Positive for myalgias.  Neurological:  Negative for headaches.    Physical Exam Triage Vital Signs ED Triage Vitals  Enc Vitals Group     BP 10/26/21 1717 124/90     Pulse Rate 10/26/21 1717 (!) 54     Resp --      Temp 10/26/21 1717 99.5 F (37.5 C)     Temp src --      SpO2 10/26/21 1717 100 %     Weight --      Height --      Head Circumference --      Peak Flow --      Pain Score 10/26/21 1718 0     Pain Loc --      Pain Edu? --      Excl. in [place]? --    No data found.  Updated Vital Signs BP 124/90 (BP Location: Right Arm)   Pulse (!) 54   Temp 99.5 F (37.5 C)   SpO2 100%   Visual Acuity Right Eye Distance:   Left Eye Distance:   Bilateral Distance:    Right Eye Near:   Left Eye Near:    Bilateral Near:      Physical Exam Vitals and nursing note reviewed.  Constitutional:      General: He is  not in acute distress.    Appearance: He is not ill-appearing.  HENT:     Mouth/Throat:     Pharynx: No posterior oropharyngeal erythema.  Cardiovascular:     Rate and Rhythm: Normal rate and regular rhythm.     Pulses: Normal pulses.     Heart sounds: Normal heart sounds.  Pulmonary:     Effort: Pulmonary effort is normal.     Breath sounds: Normal breath sounds.  Abdominal:     General: Bowel sounds are normal.     Palpations: Abdomen is soft.  Musculoskeletal:        General: Normal range of motion.  Neurological:     Mental Status: He is alert.     UC Treatments / Results  Labs (all labs ordered are listed, but only abnormal results are displayed) Labs Reviewed - No data to display  EKG   Radiology No results found.  Procedures Procedures (including critical care time)  Medications Ordered in UC Medications - No data to display  Initial Impression / Assessment and Plan / UC Course  I have reviewed the triage vital signs and the nursing notes.  Pertinent labs & imaging results that were available during my care of the patient were reviewed by me and considered in my medical decision making (see chart for details).     1.  Viral URI with cough: Maintain adequate hydration Tessalon Perles as needed for cough Tylenol/Motrin as needed for fever/body aches If symptoms worsens please return to urgent care for further management No indication for COVID-19 testing or flu testing given the duration of your symptoms. Final Clinical Impressions(s) / UC Diagnoses   Final diagnoses:  Viral URI with cough     Discharge Instructions      Maintain adequate hydration Take medications as prescribed There is no indication for COVID or flu testing given the duration of your symptoms Return to urgent care if your symptoms worsen or if you have any further concerns.   ED  Prescriptions     Medication Sig Dispense Auth. Provider   benzonatate (TESSALON) 100 MG capsule Take 1 capsule (100 mg total) by mouth 3 (three) times daily as needed for cough. 30 capsule Nasha Diss, Myrene Galas, MD      PDMP not reviewed this encounter.   Chase Picket, MD 10/31/21 [phone]

## 2021-11-04 DIAGNOSIS — H524 Presbyopia: Secondary | ICD-10-CM | POA: Diagnosis not present

## 2021-11-04 DIAGNOSIS — E119 Type 2 diabetes mellitus without complications: Secondary | ICD-10-CM | POA: Diagnosis not present

## 2021-11-04 DIAGNOSIS — H2513 Age-related nuclear cataract, bilateral: Secondary | ICD-10-CM | POA: Diagnosis not present

## 2021-11-04 DIAGNOSIS — H16223 Keratoconjunctivitis sicca, not specified as Sjogren's, bilateral: Secondary | ICD-10-CM | POA: Diagnosis not present

## 2021-11-14 ENCOUNTER — Ambulatory Visit: Payer: PPO | Admitting: Podiatry

## 2021-11-14 ENCOUNTER — Other Ambulatory Visit: Payer: Self-pay

## 2021-11-14 ENCOUNTER — Encounter: Payer: Self-pay | Admitting: Podiatry

## 2021-11-14 DIAGNOSIS — L600 Ingrowing nail: Secondary | ICD-10-CM

## 2021-11-14 NOTE — Progress Notes (Signed)
Subjective:   Patient ID: Frank [name], male   DOB: 70 y.o.   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  Gave instructions on soaks reappoint and instructed on leaving on 24 hours but take it off earlier if any throbbing were to occur

## 2021-11-14 NOTE — Patient Instructions (Signed)

## 2021-11-27 ENCOUNTER — Encounter (INDEPENDENT_AMBULATORY_CARE_PROVIDER_SITE_OTHER): Payer: Self-pay

## 2021-11-27 ENCOUNTER — Telehealth (INDEPENDENT_AMBULATORY_CARE_PROVIDER_SITE_OTHER): Payer: Self-pay

## 2021-11-27 ENCOUNTER — Other Ambulatory Visit (INDEPENDENT_AMBULATORY_CARE_PROVIDER_SITE_OTHER): Payer: Self-pay

## 2021-11-27 DIAGNOSIS — Z86010 Personal history of colonic polyps: Secondary | ICD-10-CM

## 2021-11-27 MED ORDER — PEG 3350-KCL-NA BICARB-NACL 420 G PO SOLR: 4000.0000 mL | ORAL | 0 refills | Status: DC

## 2021-11-27 NOTE — Telephone Encounter (Signed)
Referring MD/PCP: Willey Blade  Procedure: Tcs   Reason/Indication:  Hx of Polyps, Fam Hx of Colon Ca  Has patient had this procedure before?  Yes, 2017  If so, when, by whom and where?    Is there a family history of colon cancer?  Yes, maternal grandmother  Who?  What age when diagnosed?    Is patient diabetic? If yes, Type 1 or Type 2   no      Does patient have prosthetic heart valve or mechanical valve?  no  Do you have a pacemaker/defibrillator?  no  Has patient ever had endocarditis/atrial fibrillation? no  Does patient use oxygen? no  Has patient had joint replacement within last 12 months?  no  Is patient constipated or do they take laxatives? no  Does patient have a history of alcohol/drug use?  no  Have you had a stroke/heart attack last 6 mths? no  Do you take medicine for weight loss?  no  For male patients,: have you had a hysterectomy or are you post menopausal and do you still have your menstrual cycle? N/A  Is patient on blood thinner such as Coumadin, Plavix and/or Aspirin? yes  Medications: asa 81 mg daily, amlodipine 5 mg daily, chlorthalidone 25 mg daily, losartan 100 mg daily, omeprazole 20 mg daily, ezetimibe 10 mg nightly, fish oil 100mg  daily, ginger daily, turmeric daily, tart cherry daily, mvi daily, glucosamine 2000 mg daily  Allergies: ampicillin   Medication Adjustment per Dr Laural Golden hold asa 81 mg 2 days prior to procedure  Procedure date & time: Thursday 12/12/21 at 730 am

## 2021-12-09 DIAGNOSIS — N1831 Chronic kidney disease, stage 3a: Secondary | ICD-10-CM | POA: Diagnosis not present

## 2021-12-09 DIAGNOSIS — I10 Essential (primary) hypertension: Secondary | ICD-10-CM | POA: Diagnosis not present

## 2021-12-09 DIAGNOSIS — E1129 Type 2 diabetes mellitus with other diabetic kidney complication: Secondary | ICD-10-CM | POA: Diagnosis not present

## 2021-12-09 DIAGNOSIS — Z79899 Other long term (current) drug therapy: Secondary | ICD-10-CM | POA: Diagnosis not present

## 2021-12-09 DIAGNOSIS — E785 Hyperlipidemia, unspecified: Secondary | ICD-10-CM | POA: Diagnosis not present

## 2021-12-12 ENCOUNTER — Other Ambulatory Visit: Payer: Self-pay

## 2021-12-12 ENCOUNTER — Ambulatory Visit (HOSPITAL_COMMUNITY)
Admission: RE | Admit: 2021-12-12 | Discharge: 2021-12-12 | Disposition: A | Discharge status: Home / Self Care | Payer: PPO | Attending: Internal Medicine | Admitting: Internal Medicine

## 2021-12-12 ENCOUNTER — Encounter (HOSPITAL_COMMUNITY): Payer: Self-pay | Admitting: Internal Medicine

## 2021-12-12 ENCOUNTER — Encounter (HOSPITAL_COMMUNITY)
Admission: RE | Disposition: A | Discharge status: Home / Self Care | Payer: Self-pay | Source: Home / Self Care | Attending: Internal Medicine

## 2021-12-12 DIAGNOSIS — D123 Benign neoplasm of transverse colon: Secondary | ICD-10-CM | POA: Insufficient documentation

## 2021-12-12 DIAGNOSIS — D120 Benign neoplasm of cecum: Secondary | ICD-10-CM | POA: Insufficient documentation

## 2021-12-12 DIAGNOSIS — Z1211 Encounter for screening for malignant neoplasm of colon: Secondary | ICD-10-CM | POA: Insufficient documentation

## 2021-12-12 DIAGNOSIS — Z86010 Personal history of colonic polyps: Secondary | ICD-10-CM | POA: Diagnosis not present

## 2021-12-12 DIAGNOSIS — K644 Residual hemorrhoidal skin tags: Secondary | ICD-10-CM | POA: Insufficient documentation

## 2021-12-12 DIAGNOSIS — K648 Other hemorrhoids: Secondary | ICD-10-CM | POA: Insufficient documentation

## 2021-12-12 DIAGNOSIS — Z09 Encounter for follow-up examination after completed treatment for conditions other than malignant neoplasm: Secondary | ICD-10-CM | POA: Diagnosis not present

## 2021-12-12 DIAGNOSIS — D122 Benign neoplasm of ascending colon: Secondary | ICD-10-CM | POA: Insufficient documentation

## 2021-12-12 DIAGNOSIS — K5730 Diverticulosis of large intestine without perforation or abscess without bleeding: Secondary | ICD-10-CM | POA: Diagnosis not present

## 2021-12-12 DIAGNOSIS — K635 Polyp of colon: Secondary | ICD-10-CM | POA: Diagnosis not present

## 2021-12-12 HISTORY — PX: POLYPECTOMY: SHX5525

## 2021-12-12 HISTORY — PX: COLONOSCOPY: SHX5424

## 2021-12-12 HISTORY — PX: BIOPSY: SHX5522

## 2021-12-12 LAB — HM COLONOSCOPY

## 2021-12-12 SURGERY — COLONOSCOPY
Anesthesia: Moderate Sedation

## 2021-12-12 MED ORDER — MEPERIDINE HCL 50 MG/ML IJ SOLN
INTRAMUSCULAR | Status: AC
  Filled 2021-12-12: qty 1

## 2021-12-12 MED ORDER — MIDAZOLAM HCL 5 MG/5ML IJ SOLN
INTRAMUSCULAR | Status: AC
  Filled 2021-12-12: qty 10

## 2021-12-12 MED ORDER — METAMUCIL SMOOTH TEXTURE 58.6 % PO POWD: 1.0000 | Freq: Every day | ORAL | Status: DC

## 2021-12-12 MED ORDER — SODIUM CHLORIDE 0.9 % IV SOLN: INTRAVENOUS | Status: DC

## 2021-12-12 MED ORDER — MEPERIDINE HCL 50 MG/ML IJ SOLN
INTRAMUSCULAR | Status: DC | PRN
  Administered 2021-12-12 (×2): 25 mg

## 2021-12-12 MED ORDER — MIDAZOLAM HCL 5 MG/5ML IJ SOLN
INTRAMUSCULAR | Status: DC | PRN
  Administered 2021-12-12: 1 mg via INTRAVENOUS
  Administered 2021-12-12 (×3): 2 mg via INTRAVENOUS

## 2021-12-12 NOTE — Discharge Instructions (Signed)
Resume aspirin on 12/13/2021 Resume other medications as before Metamucil 1 heaping tablespoonful by mouth daily at bedtime High-fiber diet No driving for 24 hours Physician will call with biopsy results

## 2021-12-12 NOTE — H&P (Signed)
Frank [name] is an 71 y.o. male.   Chief Complaint: Patient is here for colonoscopy. HPI: Patient is 71 year old Caucasian male who has had colonic adenomas on his prior colonoscopies who is here for surveillance exam.  Last exam was in September 2017 with removal of 3 small polyps and these were tubular adenomas.  He denies abdominal pain or rectal bleeding.  He says he can have formed stool in the morning and then may have another loose stool.  This has been going on for long time.  His appetite is good and his weight is stable. He is on low-dose aspirin which is on hold. Family history is negative for CRC.  Past Medical History:  Diagnosis Date   Arthritis    Chronic GERD complicated by short segment Barrett's esophagus but biopsies were negative in April 2012.    BPH (benign prostatic hyperplasia)    Fracture of 5th metatarsal    GERD (gastroesophageal reflux disease)    Hypercholesteremia    Hypertension    Mastoiditis    Ulcer disease    Bulbar    Past Surgical History:  Procedure Laterality Date   COLONOSCOPY     COLONOSCOPY     COLONOSCOPY N/A 08/27/2016   Procedure: COLONOSCOPY;  Surgeon: Rogene Houston, MD;  [facility];  Service: Endoscopy;  Laterality: N/A;  930 - moved to 9/27 @ 12:00 - Ann notified pt   HEMORRHOID SURGERY  2009   INGUINAL HERNIA REPAIR Right 10/15/2020   Procedure: OPEN RIGHT INGUINAL HERNIA REPAIR WITH MESH;  Surgeon: Alphonsa Overall, MD;  Location: [place];  Service: General;  Laterality: Right;   INSERTION OF MESH Right 10/15/2020   Procedure: INSERTION OF MESH;  Surgeon: Alphonsa Overall, MD;  Location: [place];  Service: General;  Laterality: Right;   KNEE ARTHROSCOPY     Right Knee   ORIF TOE FRACTURE Left 12/09/2013   Procedure: LEFT OPEN REDUCTION INTERNAL FIXATION (ORIF) FIFTH METATARSAL FRACTURE;  Surgeon: Lorn Junes, MD;  Location: [place];  Service: Orthopedics;  Laterality: Left;   POLYPECTOMY  08/27/2016    Procedure: POLYPECTOMY;  Surgeon: Rogene Houston, MD;  [facility];  Service: Endoscopy;;  colon   rotor cuff  2009   right-   TONSILLECTOMY     UPPER GASTROINTESTINAL ENDOSCOPY      Family History  Problem Relation Age of Onset   COPD Mother    Heart disease Father    Healthy Sister    Lung cancer Brother    Heart disease Brother    Heart disease Sister    Prostate cancer Brother    Healthy Son    Healthy Son    Social History:  reports that he has never smoked. He has never used smokeless tobacco. He reports that he does not drink alcohol and does not use drugs.  Allergies:  Allergies  Allergen Reactions   Ampicillin Rash    Medications Prior to Admission  Medication Sig Dispense Refill   amLODipine (NORVASC) 5 MG tablet Take 5 mg by mouth daily.     aspirin EC 81 MG tablet Take 81 mg by mouth daily. Swallow whole.     chlorthalidone (HYGROTON) 25 MG tablet Take 25 mg by mouth daily.     ezetimibe (ZETIA) 10 MG tablet Take 10 mg by mouth daily.     Ginger, Zingiber officinalis, (GINGER PO) Take 1 capsule by mouth daily.     Glucosamine HCl 1000  MG TABS Take 2,000 mg by mouth daily.     losartan (COZAAR) 100 MG tablet Take 100 mg by mouth daily.     Misc Natural Products (TART CHERRY ADVANCED PO) Take 1 tablet by mouth daily.     Multiple Vitamin (MULTIVITAMIN WITH MINERALS) TABS tablet Take 1 tablet by mouth daily.     Omega-3 Fatty Acids (FISH OIL) 1000 MG CAPS Take 1,000 mg by mouth daily.     omeprazole (PRILOSEC) 20 MG capsule Take 20 mg by mouth daily.      polyethylene glycol-electrolytes (TRILYTE) 420 g solution Take 4,000 mLs by mouth as directed. 4000 mL 0   TURMERIC PO Take 1 capsule by mouth daily.      No results found for this or any previous visit (from the past 48 hour(s)). No results found.  Review of Systems  Blood pressure 130/89, pulse 72, temperature 98.4 F (36.9 C), temperature source Oral, resp. rate 15, height 5' 10.5" (1.791  m), weight 86.2 kg, SpO2 95 %. Physical Exam HENT:     Mouth/Throat:     Mouth: Mucous membranes are moist.     Pharynx: Oropharynx is clear.  Eyes:     General: No scleral icterus.    Conjunctiva/sclera: Conjunctivae normal.  Cardiovascular:     Rate and Rhythm: Normal rate and regular rhythm.     Heart sounds: Normal heart sounds. No murmur heard. Pulmonary:     Effort: Pulmonary effort is normal.     Breath sounds: Normal breath sounds.  Abdominal:     Comments: Abdomen is symmetrical.  Small umbilical hernia.  It is reducible.  Bilateral inguinal scars.  On palpation abdomen is soft and nontender with organomegaly or masses.  Musculoskeletal:        General: No swelling.     Cervical back: Neck supple.  Lymphadenopathy:     Cervical: No cervical adenopathy.  Skin:    General: Skin is warm and dry.  Neurological:     Mental Status: He is alert.     Assessment/Plan  History of colonic polyps/tubular adenomas. Surveillance colonoscopy.  Hildred Laser, MD 12/12/2021, 7:36 AM

## 2021-12-12 NOTE — Op Note (Signed)
[facility] Patient Name: Frank [name] Procedure Date: 12/12/2021 7:05 AM MRN: [id number] Date of Birth: 17-Dec-1950 Attending MD: Hildred Laser , MD CSN: [id number] Age: 71 Admit Type: Outpatient Procedure:                Colonoscopy Indications:              High risk colon cancer surveillance: Personal                            history of colonic polyps Providers:                Hildred Laser, MD, Rosina Lowenstein, RN, Raphael Gibney,                            Technician Referring MD:             Asencion Noble, MD Medicines:                Meperidine 50 mg IV, Midazolam 7 mg IV Complications:            No immediate complications. Estimated Blood Loss:     Estimated blood loss was minimal. Procedure:                Pre-Anesthesia Assessment:                           - Prior to the procedure, a History and Physical                            was performed, and patient medications and                            allergies were reviewed. The patient's tolerance of                            previous anesthesia was also reviewed. The risks                            and benefits of the procedure and the sedation                            options and risks were discussed with the patient.                            All questions were answered, and informed consent                            was obtained. Prior Anticoagulants: The patient has                            taken no previous anticoagulant or antiplatelet                            agents except for aspirin. ASA Grade Assessment: II                            -  A patient with mild systemic disease. After                            reviewing the risks and benefits, the patient was                            deemed in satisfactory condition to undergo the                            procedure.                           After obtaining informed consent, the colonoscope                            was passed under direct vision.  Throughout the                            procedure, the patient's blood pressure, pulse, and                            oxygen saturations were monitored continuously. The                            [id number]-HQ190L ([id number]) scope was introduced through                            the anus and advanced to the the terminal ileum,                            with identification of the appendiceal orifice and                            IC valve. The colonoscopy was technically difficult                            and complex due to multiple diverticula in the                            colon and restricted mobility of the colon.                            Successful completion of the procedure was aided by                            withdrawing the scope and replacing with the                            'babyscope'. The patient tolerated the procedure                            well. The quality of the bowel preparation was  adequate. The terminal ileum, ileocecal valve,                            appendiceal orifice, and rectum were photographed. Scope In: 7:50:11 AM Scope Out: 8:19:07 AM Scope Withdrawal Time: 0 hours 13 minutes 51 seconds  Total Procedure Duration: 0 hours 28 minutes 56 seconds  Findings:      Skin tags were found on perianal exam.      The terminal ileum appeared normal.      Three sessile polyps were found in the transverse colon and cecum. The       polyps were small in size. These polyps were removed with a cold snare.       Resection and retrieval were complete. The pathology specimen was placed       into Bottle Number 1.      Two flat polyps were found in the ascending colon and cecum. The polyps       were small in size. These were biopsied with a cold forceps for       histology. The pathology specimen was placed into Bottle Number 1.      Multiple small and large-mouthed diverticula were found in the entire       colon.      External and  internal hemorrhoids were found during retroflexion. The       hemorrhoids were medium-sized. Impression:               - Perianal skin tags found on perianal exam.                           - The examined portion of the ileum was normal.                           - Three small polyps in the transverse colon and in                            the cecum, removed with a cold snare. Resected and                            retrieved.                           - Two small polyps in the ascending colon and in                            the cecum. Biopsied.                           - Diverticulosis in the entire examined colon.                           - External and internal hemorrhoids. Moderate Sedation:      Moderate (conscious) sedation was administered by the endoscopy nurse       and supervised by the endoscopist. The following parameters were       monitored: oxygen saturation, heart rate, blood pressure, CO2       capnography and response to care. Total physician intraservice time was  35 minutes. Recommendation:           - Patient has a contact number available for                            emergencies. The signs and symptoms of potential                            delayed complications were discussed with the                            patient. Return to normal activities tomorrow.                            Written discharge instructions were provided to the                            patient.                           - High fiber diet today.                           - Continue present medications.                           - No aspirin, ibuprofen, naproxen, or other                            non-steroidal anti-inflammatory drugs for 1 day.                           - Await pathology results.                           - Repeat colonoscopy is recommended. The                            colonoscopy date will be determined after pathology                            results  from today's exam become available for                            review. Procedure Code(s):        --- Professional ---                           [phone], Colonoscopy, flexible; with removal of                            tumor(s), polyp(s), or other lesion(s) by snare                            technique  29528, 59, Colonoscopy, flexible; with biopsy,                            single or multiple                           99153, Moderate sedation; each additional 15                            minutes intraservice time                           G0500, Moderate sedation services provided by the                            same physician or other qualified health care                            professional performing a gastrointestinal                            endoscopic service that sedation supports,                            requiring the presence of an independent trained                            observer to assist in the monitoring of the                            patient's level of consciousness and physiological                            status; initial 15 minutes of intra-service time;                            patient age 3 years or older (additional time may                            be reported with [phone], as appropriate) Diagnosis Code(s):        --- Professional ---                           Z86.010, Personal history of colonic polyps                           K64.8, Other hemorrhoids                           K63.5, Polyp of colon                           K64.4, Residual hemorrhoidal skin tags                           K57.30, Diverticulosis of large intestine without  perforation or abscess without bleeding CPT copyright 2019 [employer]. All rights reserved. The codes documented in this report are preliminary and upon coder review may  be revised to meet current compliance requirements. Hildred Laser, MD Hildred Laser, MD 12/12/2021 8:33:21 AM This report has been signed electronically. Number of Addenda: 0

## 2021-12-13 LAB — SURGICAL PATHOLOGY

## 2021-12-16 ENCOUNTER — Encounter (HOSPITAL_COMMUNITY): Payer: Self-pay | Admitting: Internal Medicine

## 2021-12-18 DIAGNOSIS — I10 Essential (primary) hypertension: Secondary | ICD-10-CM | POA: Diagnosis not present

## 2021-12-18 DIAGNOSIS — E1122 Type 2 diabetes mellitus with diabetic chronic kidney disease: Secondary | ICD-10-CM | POA: Diagnosis not present

## 2021-12-18 DIAGNOSIS — Z23 Encounter for immunization: Secondary | ICD-10-CM | POA: Diagnosis not present

## 2021-12-18 DIAGNOSIS — R7309 Other abnormal glucose: Secondary | ICD-10-CM | POA: Diagnosis not present

## 2021-12-18 DIAGNOSIS — N1831 Chronic kidney disease, stage 3a: Secondary | ICD-10-CM | POA: Diagnosis not present

## 2021-12-18 DIAGNOSIS — G720 Drug-induced myopathy: Secondary | ICD-10-CM | POA: Diagnosis not present

## 2021-12-19 ENCOUNTER — Encounter (INDEPENDENT_AMBULATORY_CARE_PROVIDER_SITE_OTHER): Payer: Self-pay | Admitting: *Deleted

## 2022-01-07 DIAGNOSIS — L821 Other seborrheic keratosis: Secondary | ICD-10-CM | POA: Diagnosis not present

## 2022-01-07 DIAGNOSIS — L578 Other skin changes due to chronic exposure to nonionizing radiation: Secondary | ICD-10-CM | POA: Diagnosis not present

## 2022-01-07 DIAGNOSIS — L814 Other melanin hyperpigmentation: Secondary | ICD-10-CM | POA: Diagnosis not present

## 2022-01-07 DIAGNOSIS — D225 Melanocytic nevi of trunk: Secondary | ICD-10-CM | POA: Diagnosis not present

## 2022-01-07 DIAGNOSIS — B353 Tinea pedis: Secondary | ICD-10-CM | POA: Diagnosis not present

## 2022-01-07 DIAGNOSIS — D1801 Hemangioma of skin and subcutaneous tissue: Secondary | ICD-10-CM | POA: Diagnosis not present

## 2022-03-15 IMAGING — US US RENAL
1 series · 14 of 25 positions shown · non-contrast
Comparison: Prior CT from 03/15/2010.

CLINICAL DATA: Initial evaluation for abnormal finding of blood
chemistry.

EXAM:
RENAL / URINARY TRACT ULTRASOUND COMPLETE

[Series 1: us renal · 0.25mm/px · 14 of 69 slices shown]
[im 1/69]
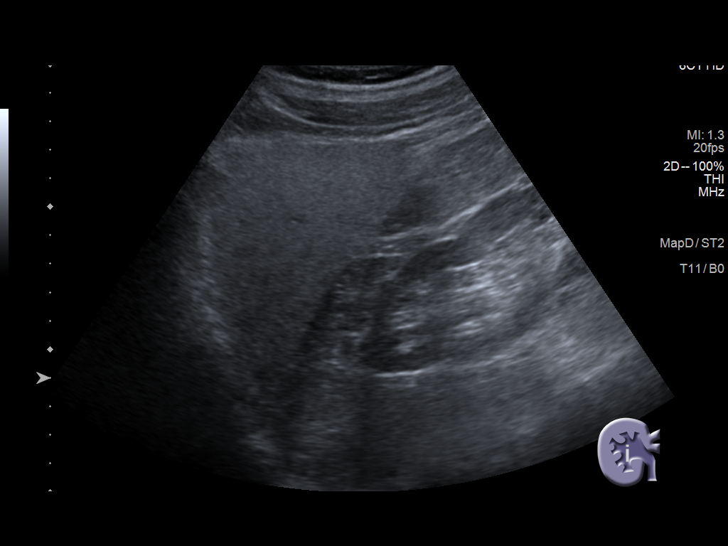
[im 6/69]
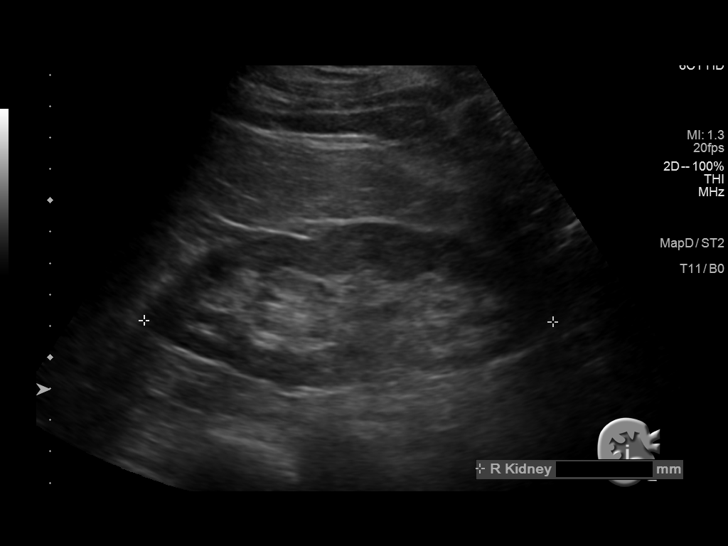
[im 12/69]
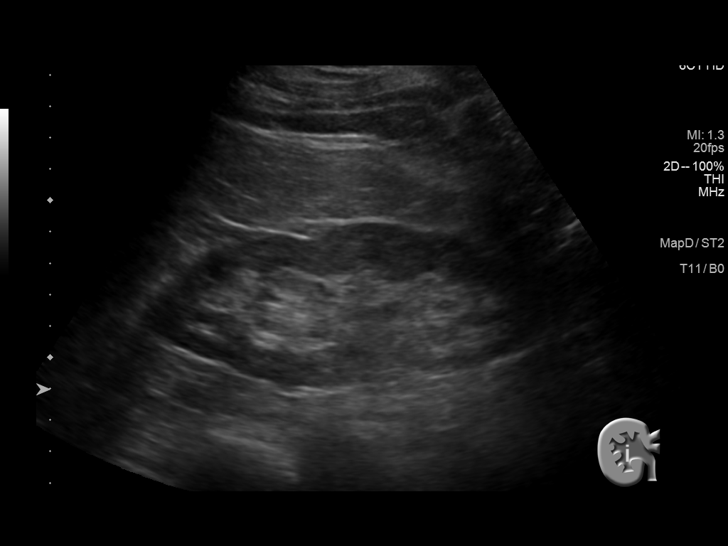
[im 18/69]
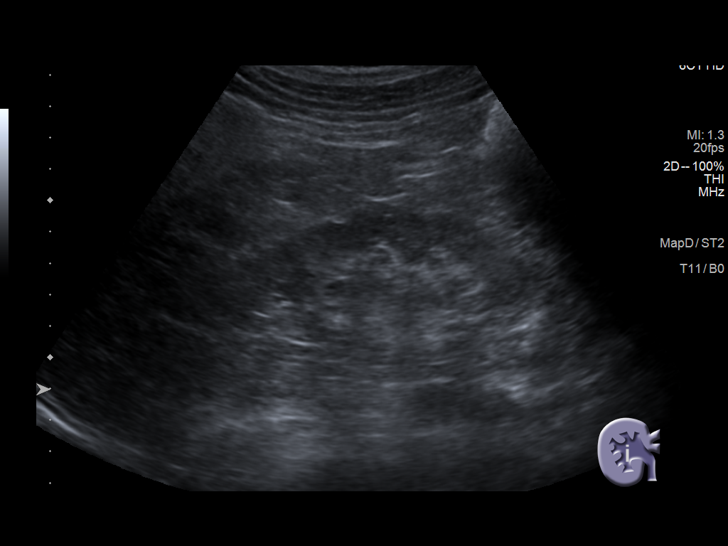
[im 23/69]
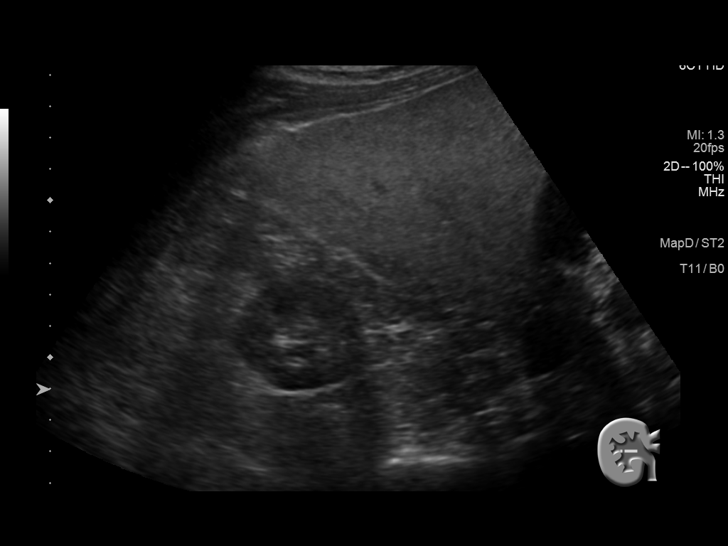
[im 26/69]
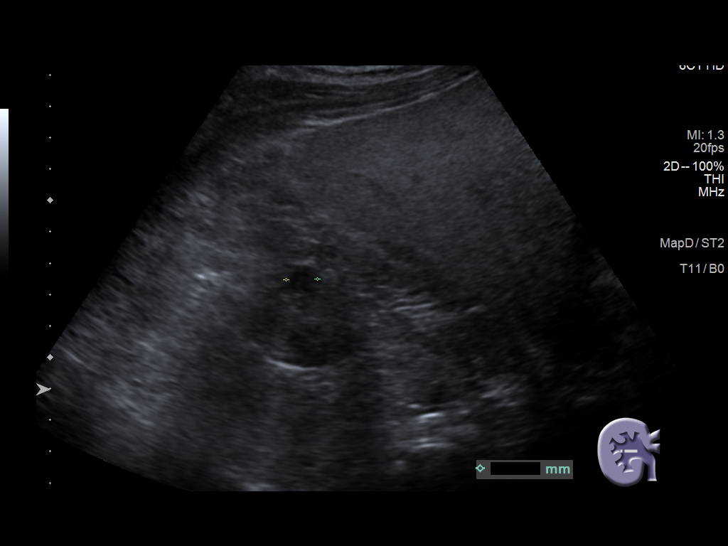
[im 32/69]
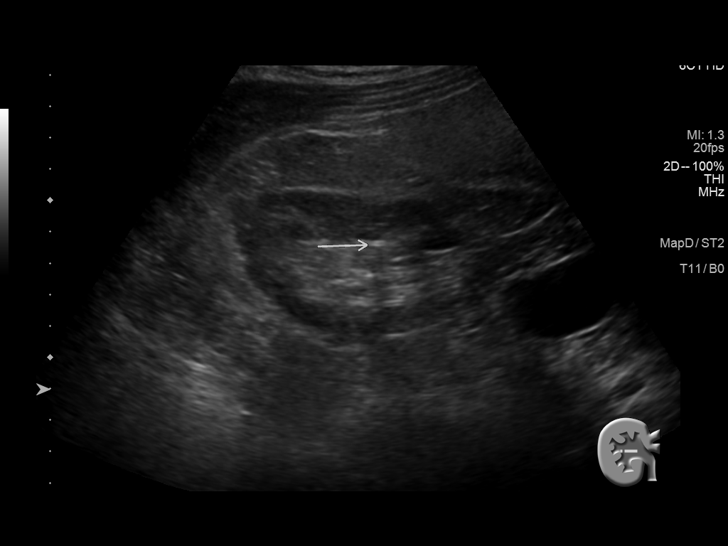
[im 37/69]
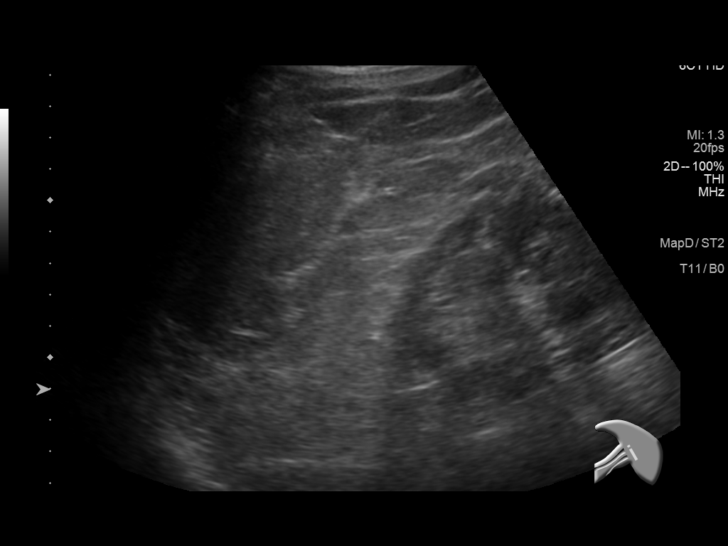
[im 43/69]
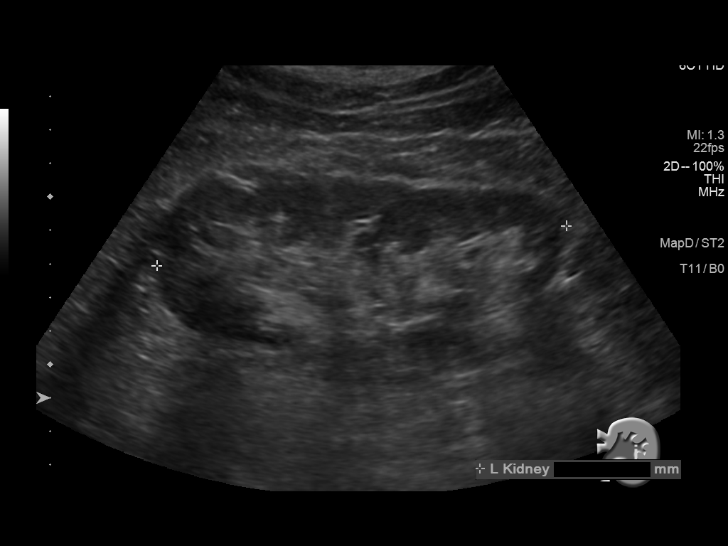
[im 46/69]
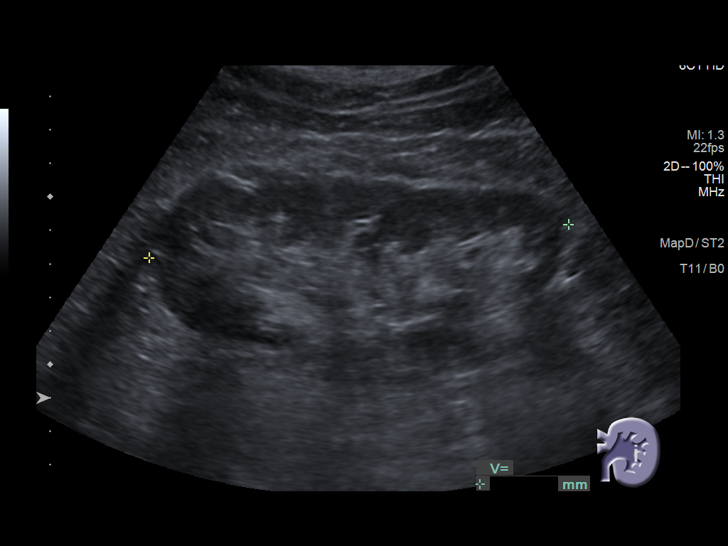
[im 52/69]
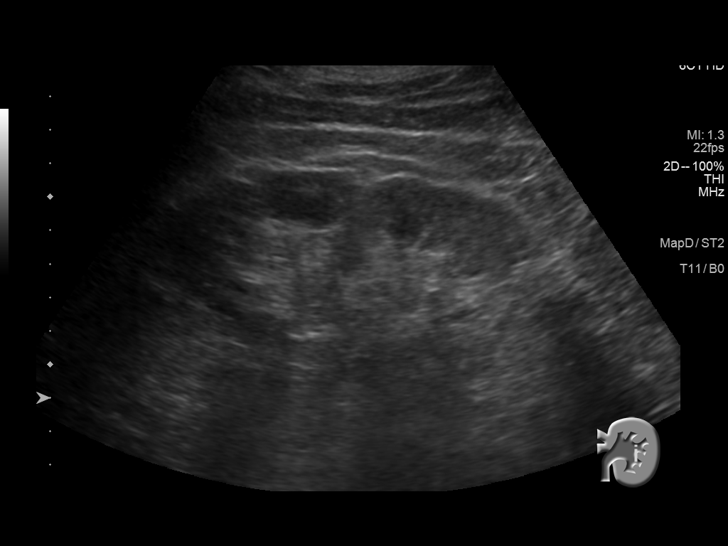
[im 57/69]
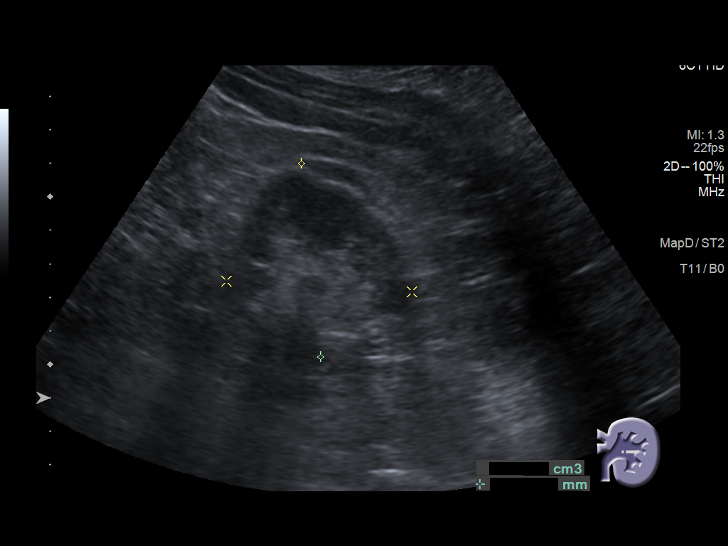
[im 63/69]
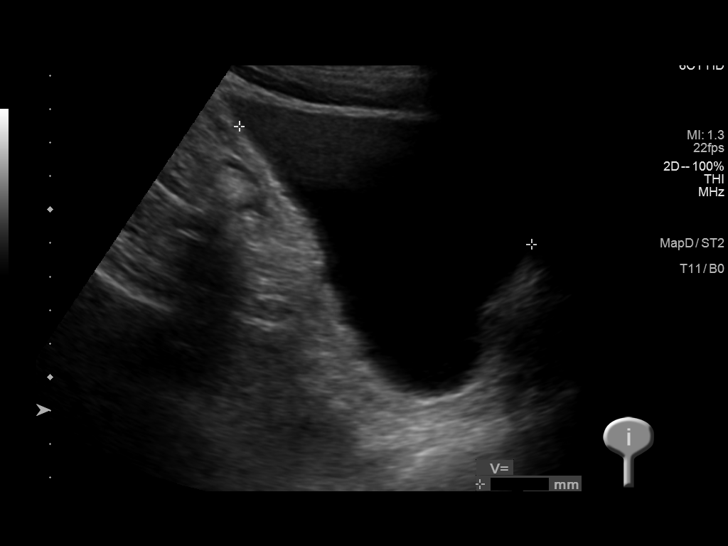
[im 69/69]
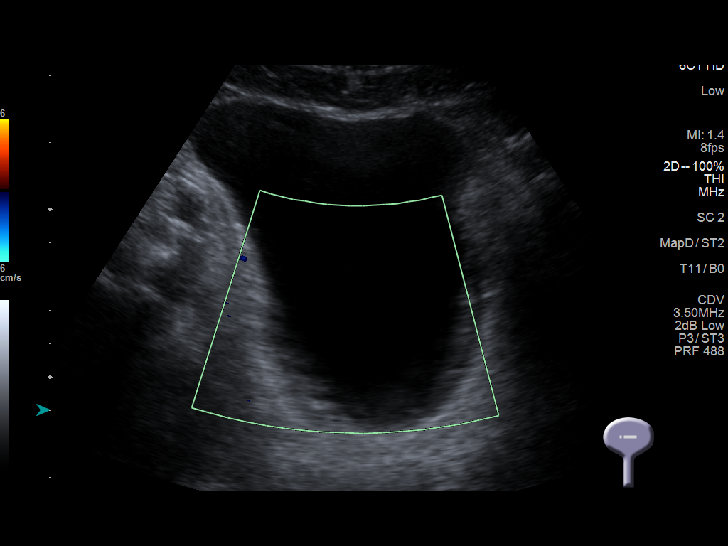

[14 of 25 positions shown; findings below may reference images not displayed]

FINDINGS: Right Kidney:

Renal measurements: 12.8 x 5.8 x 7.2 cm = volume: 284 mL. Renal
echogenicity within normal limits. 2 mm nonobstructive stone present
at the upper pole. No hydronephrosis. 0.8 x 0.8 x 1.0 cm simple cyst
present at the upper pole. No other focal renal mass.

Left Kidney:

Renal measurements: 12.3 x 5.5 x 5.8 cm = volume: 211 mL.
Echogenicity within normal limits. No nephrolithiasis or
hydronephrosis. No focal renal mass.

Bladder:

Appears normal for degree of bladder distention.

Other:

None.
IMPRESSION: 1. 2 mm nonobstructive right renal nephrolithiasis. No
hydronephrosis.
2. 1 cm simple cyst at the upper pole the right kidney.
3. Otherwise unremarkable renal ultrasound.

## 2022-04-10 DIAGNOSIS — E1129 Type 2 diabetes mellitus with other diabetic kidney complication: Secondary | ICD-10-CM | POA: Diagnosis not present

## 2022-04-10 DIAGNOSIS — N1831 Chronic kidney disease, stage 3a: Secondary | ICD-10-CM | POA: Diagnosis not present

## 2022-04-10 DIAGNOSIS — T466X5A Adverse effect of antihyperlipidemic and antiarteriosclerotic drugs, initial encounter: Secondary | ICD-10-CM | POA: Diagnosis not present

## 2022-04-10 DIAGNOSIS — Z79899 Other long term (current) drug therapy: Secondary | ICD-10-CM | POA: Diagnosis not present

## 2022-04-10 DIAGNOSIS — I10 Essential (primary) hypertension: Secondary | ICD-10-CM | POA: Diagnosis not present

## 2022-04-17 DIAGNOSIS — N1831 Chronic kidney disease, stage 3a: Secondary | ICD-10-CM | POA: Diagnosis not present

## 2022-04-17 DIAGNOSIS — R7309 Other abnormal glucose: Secondary | ICD-10-CM | POA: Diagnosis not present

## 2022-04-17 DIAGNOSIS — I10 Essential (primary) hypertension: Secondary | ICD-10-CM | POA: Diagnosis not present

## 2022-04-17 DIAGNOSIS — M25551 Pain in right hip: Secondary | ICD-10-CM | POA: Diagnosis not present

## 2022-04-17 DIAGNOSIS — E1122 Type 2 diabetes mellitus with diabetic chronic kidney disease: Secondary | ICD-10-CM | POA: Diagnosis not present

## 2022-06-06 DIAGNOSIS — M545 Low back pain, unspecified: Secondary | ICD-10-CM | POA: Diagnosis not present

## 2022-06-06 DIAGNOSIS — M1711 Unilateral primary osteoarthritis, right knee: Secondary | ICD-10-CM | POA: Diagnosis not present

## 2022-06-18 DIAGNOSIS — M545 Low back pain, unspecified: Secondary | ICD-10-CM | POA: Diagnosis not present

## 2022-07-04 ENCOUNTER — Other Ambulatory Visit: Payer: Self-pay | Admitting: Orthopedic Surgery

## 2022-07-04 ENCOUNTER — Other Ambulatory Visit (HOSPITAL_COMMUNITY): Payer: Self-pay | Admitting: Orthopedic Surgery

## 2022-07-04 DIAGNOSIS — M5127 Other intervertebral disc displacement, lumbosacral region: Secondary | ICD-10-CM

## 2022-07-04 DIAGNOSIS — M545 Low back pain, unspecified: Secondary | ICD-10-CM | POA: Diagnosis not present

## 2022-07-10 ENCOUNTER — Ambulatory Visit (HOSPITAL_COMMUNITY): Payer: PPO | Admitting: Physical Therapy

## 2022-07-18 ENCOUNTER — Ambulatory Visit (HOSPITAL_COMMUNITY): Admission: RE | Admit: 2022-07-18 | Payer: PPO | Source: Ambulatory Visit

## 2022-07-21 DIAGNOSIS — Z125 Encounter for screening for malignant neoplasm of prostate: Secondary | ICD-10-CM | POA: Diagnosis not present

## 2022-07-21 DIAGNOSIS — N400 Enlarged prostate without lower urinary tract symptoms: Secondary | ICD-10-CM | POA: Diagnosis not present

## 2022-07-21 DIAGNOSIS — E785 Hyperlipidemia, unspecified: Secondary | ICD-10-CM | POA: Diagnosis not present

## 2022-07-21 DIAGNOSIS — E1129 Type 2 diabetes mellitus with other diabetic kidney complication: Secondary | ICD-10-CM | POA: Diagnosis not present

## 2022-07-21 DIAGNOSIS — I10 Essential (primary) hypertension: Secondary | ICD-10-CM | POA: Diagnosis not present

## 2022-07-21 DIAGNOSIS — N1831 Chronic kidney disease, stage 3a: Secondary | ICD-10-CM | POA: Diagnosis not present

## 2022-07-21 DIAGNOSIS — Z79899 Other long term (current) drug therapy: Secondary | ICD-10-CM | POA: Diagnosis not present

## 2022-07-21 DIAGNOSIS — T466X5A Adverse effect of antihyperlipidemic and antiarteriosclerotic drugs, initial encounter: Secondary | ICD-10-CM | POA: Diagnosis not present

## 2022-07-21 DIAGNOSIS — M1990 Unspecified osteoarthritis, unspecified site: Secondary | ICD-10-CM | POA: Diagnosis not present

## 2022-07-21 DIAGNOSIS — K219 Gastro-esophageal reflux disease without esophagitis: Secondary | ICD-10-CM | POA: Diagnosis not present

## 2022-07-23 DIAGNOSIS — Z6828 Body mass index (BMI) 28.0-28.9, adult: Secondary | ICD-10-CM | POA: Diagnosis not present

## 2022-07-23 DIAGNOSIS — I700 Atherosclerosis of aorta: Secondary | ICD-10-CM | POA: Diagnosis not present

## 2022-07-23 DIAGNOSIS — Z0001 Encounter for general adult medical examination with abnormal findings: Secondary | ICD-10-CM | POA: Diagnosis not present

## 2022-07-23 DIAGNOSIS — E1129 Type 2 diabetes mellitus with other diabetic kidney complication: Secondary | ICD-10-CM | POA: Diagnosis not present

## 2022-07-23 DIAGNOSIS — N183 Chronic kidney disease, stage 3 unspecified: Secondary | ICD-10-CM | POA: Diagnosis not present

## 2022-07-23 DIAGNOSIS — R739 Hyperglycemia, unspecified: Secondary | ICD-10-CM | POA: Diagnosis not present

## 2022-07-23 DIAGNOSIS — G720 Drug-induced myopathy: Secondary | ICD-10-CM | POA: Diagnosis not present

## 2022-07-23 DIAGNOSIS — E785 Hyperlipidemia, unspecified: Secondary | ICD-10-CM | POA: Diagnosis not present

## 2022-07-23 DIAGNOSIS — I10 Essential (primary) hypertension: Secondary | ICD-10-CM | POA: Diagnosis not present

## 2022-07-23 DIAGNOSIS — I493 Ventricular premature depolarization: Secondary | ICD-10-CM | POA: Diagnosis not present

## 2022-08-06 ENCOUNTER — Ambulatory Visit (HOSPITAL_COMMUNITY)
Admission: RE | Admit: 2022-08-06 | Discharge: 2022-08-06 | Disposition: A | Discharge status: Home / Self Care | Payer: PPO | Source: Ambulatory Visit | Attending: Orthopedic Surgery | Admitting: Orthopedic Surgery

## 2022-08-06 DIAGNOSIS — M545 Low back pain, unspecified: Secondary | ICD-10-CM | POA: Diagnosis not present

## 2022-08-06 DIAGNOSIS — M4316 Spondylolisthesis, lumbar region: Secondary | ICD-10-CM | POA: Diagnosis not present

## 2022-08-06 DIAGNOSIS — M5127 Other intervertebral disc displacement, lumbosacral region: Secondary | ICD-10-CM | POA: Insufficient documentation

## 2022-08-13 DIAGNOSIS — M545 Low back pain, unspecified: Secondary | ICD-10-CM | POA: Diagnosis not present

## 2022-08-27 ENCOUNTER — Other Ambulatory Visit: Payer: Self-pay | Admitting: Orthopedic Surgery

## 2022-08-27 DIAGNOSIS — M48061 Spinal stenosis, lumbar region without neurogenic claudication: Secondary | ICD-10-CM

## 2022-09-03 ENCOUNTER — Ambulatory Visit
Admission: RE | Admit: 2022-09-03 | Discharge: 2022-09-03 | Disposition: A | Discharge status: Home / Self Care | Payer: PPO | Source: Ambulatory Visit | Attending: Orthopedic Surgery | Admitting: Orthopedic Surgery

## 2022-09-03 DIAGNOSIS — M48061 Spinal stenosis, lumbar region without neurogenic claudication: Secondary | ICD-10-CM

## 2022-09-03 DIAGNOSIS — M47816 Spondylosis without myelopathy or radiculopathy, lumbar region: Secondary | ICD-10-CM | POA: Diagnosis not present

## 2022-09-03 MED ORDER — IOPAMIDOL (ISOVUE-M 200) INJECTION 41%
1.0000 mL | Freq: Once | INTRAMUSCULAR | Status: AC
  Administered 2022-09-03: 1 mL via EPIDURAL

## 2022-09-03 MED ORDER — METHYLPREDNISOLONE ACETATE 40 MG/ML INJ SUSP (RADIOLOG
80.0000 mg | Freq: Once | INTRAMUSCULAR | Status: AC
  Administered 2022-09-03: 80 mg via EPIDURAL

## 2022-09-03 NOTE — Discharge Instructions (Signed)

## 2022-09-23 ENCOUNTER — Other Ambulatory Visit: Payer: Self-pay | Admitting: Orthopedic Surgery

## 2022-09-23 DIAGNOSIS — M4800 Spinal stenosis, site unspecified: Secondary | ICD-10-CM

## 2022-09-24 DIAGNOSIS — M25551 Pain in right hip: Secondary | ICD-10-CM | POA: Diagnosis not present

## 2022-09-24 DIAGNOSIS — Z23 Encounter for immunization: Secondary | ICD-10-CM | POA: Diagnosis not present

## 2022-09-25 ENCOUNTER — Other Ambulatory Visit: Payer: Self-pay | Admitting: Orthopedic Surgery

## 2022-09-25 DIAGNOSIS — M4800 Spinal stenosis, site unspecified: Secondary | ICD-10-CM

## 2022-10-08 DIAGNOSIS — M25551 Pain in right hip: Secondary | ICD-10-CM | POA: Diagnosis not present

## 2022-10-31 DIAGNOSIS — E1129 Type 2 diabetes mellitus with other diabetic kidney complication: Secondary | ICD-10-CM | POA: Diagnosis not present

## 2022-10-31 DIAGNOSIS — E785 Hyperlipidemia, unspecified: Secondary | ICD-10-CM | POA: Diagnosis not present

## 2022-10-31 DIAGNOSIS — I10 Essential (primary) hypertension: Secondary | ICD-10-CM | POA: Diagnosis not present

## 2022-10-31 DIAGNOSIS — Z79899 Other long term (current) drug therapy: Secondary | ICD-10-CM | POA: Diagnosis not present

## 2022-11-05 DIAGNOSIS — M7061 Trochanteric bursitis, right hip: Secondary | ICD-10-CM | POA: Diagnosis not present

## 2022-11-05 DIAGNOSIS — M25551 Pain in right hip: Secondary | ICD-10-CM | POA: Diagnosis not present

## 2023-01-19 DIAGNOSIS — M7061 Trochanteric bursitis, right hip: Secondary | ICD-10-CM | POA: Diagnosis not present

## 2023-02-18 DIAGNOSIS — E119 Type 2 diabetes mellitus without complications: Secondary | ICD-10-CM | POA: Diagnosis not present

## 2023-02-18 DIAGNOSIS — H2513 Age-related nuclear cataract, bilateral: Secondary | ICD-10-CM | POA: Diagnosis not present

## 2023-02-18 DIAGNOSIS — Z7984 Long term (current) use of oral hypoglycemic drugs: Secondary | ICD-10-CM | POA: Diagnosis not present

## 2023-02-25 DIAGNOSIS — E1129 Type 2 diabetes mellitus with other diabetic kidney complication: Secondary | ICD-10-CM | POA: Diagnosis not present

## 2023-03-13 DIAGNOSIS — M48062 Spinal stenosis, lumbar region with neurogenic claudication: Secondary | ICD-10-CM | POA: Diagnosis not present

## 2023-03-13 DIAGNOSIS — M47816 Spondylosis without myelopathy or radiculopathy, lumbar region: Secondary | ICD-10-CM | POA: Diagnosis not present

## 2023-03-13 DIAGNOSIS — M4316 Spondylolisthesis, lumbar region: Secondary | ICD-10-CM | POA: Diagnosis not present

## 2023-03-13 DIAGNOSIS — M5136 Other intervertebral disc degeneration, lumbar region: Secondary | ICD-10-CM | POA: Diagnosis not present

## 2023-03-16 DIAGNOSIS — K429 Umbilical hernia without obstruction or gangrene: Secondary | ICD-10-CM | POA: Diagnosis not present

## 2023-03-16 DIAGNOSIS — E1122 Type 2 diabetes mellitus with diabetic chronic kidney disease: Secondary | ICD-10-CM | POA: Diagnosis not present

## 2023-03-16 DIAGNOSIS — I10 Essential (primary) hypertension: Secondary | ICD-10-CM | POA: Diagnosis not present

## 2023-03-25 ENCOUNTER — Other Ambulatory Visit: Payer: Self-pay

## 2023-03-25 ENCOUNTER — Ambulatory Visit (HOSPITAL_COMMUNITY): Payer: PPO | Attending: Neurosurgery | Admitting: Physical Therapy

## 2023-03-25 ENCOUNTER — Ambulatory Visit (HOSPITAL_COMMUNITY): Payer: PPO | Admitting: Physical Therapy

## 2023-03-25 ENCOUNTER — Encounter (HOSPITAL_COMMUNITY): Payer: Self-pay | Admitting: Physical Therapy

## 2023-03-25 DIAGNOSIS — M5459 Other low back pain: Secondary | ICD-10-CM | POA: Diagnosis not present

## 2023-03-25 DIAGNOSIS — R2689 Other abnormalities of gait and mobility: Secondary | ICD-10-CM | POA: Diagnosis not present

## 2023-03-25 DIAGNOSIS — R29898 Other symptoms and signs involving the musculoskeletal system: Secondary | ICD-10-CM | POA: Diagnosis not present

## 2023-03-25 DIAGNOSIS — M6281 Muscle weakness (generalized): Secondary | ICD-10-CM

## 2023-03-25 DIAGNOSIS — M25551 Pain in right hip: Secondary | ICD-10-CM | POA: Diagnosis not present

## 2023-03-25 NOTE — Therapy (Signed)
OUTPATIENT PHYSICAL THERAPY THORACOLUMBAR EVALUATION   Patient Name: Frank [name] MRN: [id number] DOB:Oct 22, 1951, 72 y.o., male Today's Date: 03/25/2023  END OF SESSION:  PT End of Session - 03/25/23 0817     Visit Number 1    Number of Visits 6    Date for PT Re-Evaluation 05/06/23    Authorization Type [employer]    Progress Note Due on Visit 10    PT Start Time 0816    PT Stop Time 0902    PT Time Calculation (min) 46 min    Activity Tolerance Patient tolerated treatment well    Behavior During Therapy [facility] for tasks assessed/performed             Past Medical History:  Diagnosis Date   Arthritis    Barrett's esophagus    BPH (benign prostatic hyperplasia)    Fracture of 5th metatarsal    GERD (gastroesophageal reflux disease)    Hypercholesteremia    Hypertension    Mastoiditis    Ulcer disease    Bulbar   Past Surgical History:  Procedure Laterality Date   BIOPSY  12/12/2021   Procedure: BIOPSY;  Surgeon: Malissa Hippo, MD;  [facility];  Service: Endoscopy;;   COLONOSCOPY     COLONOSCOPY     COLONOSCOPY N/A 08/27/2016   Procedure: COLONOSCOPY;  Surgeon: Malissa Hippo, MD;  [facility];  Service: Endoscopy;  Laterality: N/A;  930 - moved to 9/27 @ 12:00 - Ann notified pt   COLONOSCOPY N/A 12/12/2021   Procedure: COLONOSCOPY;  Surgeon: Malissa Hippo, MD;  [facility];  Service: Endoscopy;  Laterality: N/A;  730   HEMORRHOID SURGERY  2009   INGUINAL HERNIA REPAIR Right 10/15/2020   Procedure: OPEN RIGHT INGUINAL HERNIA REPAIR WITH MESH;  Surgeon: Ovidio Kin, MD;  Location: [facility] OR;  Service: General;  Laterality: Right;   INSERTION OF MESH Right 10/15/2020   Procedure: INSERTION OF MESH;  Surgeon: Ovidio Kin, MD;  Location: [facility] OR;  Service: General;  Laterality: Right;   KNEE ARTHROSCOPY     Right Knee   ORIF TOE FRACTURE Left 12/09/2013   Procedure: LEFT OPEN REDUCTION INTERNAL FIXATION (ORIF) FIFTH  METATARSAL FRACTURE;  Surgeon: Nilda Simmer, MD;  Location: [facility];  Service: Orthopedics;  Laterality: Left;   POLYPECTOMY  08/27/2016   Procedure: POLYPECTOMY;  Surgeon: Malissa Hippo, MD;  [facility];  Service: Endoscopy;;  colon   POLYPECTOMY  12/12/2021   Procedure: POLYPECTOMY;  Surgeon: Malissa Hippo, MD;  [facility];  Service: Endoscopy;;   rotor cuff  2009   right-   TONSILLECTOMY     UPPER GASTROINTESTINAL ENDOSCOPY     Patient Active Problem List   Diagnosis Date Noted   Left foot pain 02/14/2014   Fracture of 5th metatarsal    BPH (benign prostatic hyperplasia)    Mastoiditis    Hypertension    Barrett's esophagus    Ulcer disease    GERD (gastroesophageal reflux disease) 05/24/2012   HTN (hypertension) 05/24/2012   History of colonic polyps 05/24/2012    PCP: Carylon Perches MD  REFERRING PROVIDER: Tressie Stalker, MD  REFERRING DIAG: [phone] (ICD-10-CM) - Spondylosis without myelopathy or radiculopathy, lumbar region  Rationale for Evaluation and Treatment: Rehabilitation  THERAPY DIAG:  Other low back pain  Muscle weakness (generalized)  Other abnormalities of gait and mobility  Other symptoms and signs involving the musculoskeletal  system  ONSET DATE: about 6 months  SUBJECTIVE:                                                                                                                                                                                           SUBJECTIVE STATEMENT: Patient states low back pain that begin about 6 months ago with insidious onset. Gradual worsening since then. Symptoms increase with laying on side. Pain in R hip/low back but mostly hip. He had a few injections in hip that did not help. Symptoms may decrease with stretching leg. Walking is getting better. Symptoms come and go. Throbbing symptoms down R lateral leg down to calf that started about a week ago.   PERTINENT HISTORY:   HLD, HTN  PAIN:  Are you having pain? Yes: NPRS scale: 2/10 Pain location: R hip/low back Pain description: burning/unsure Aggravating factors: laying on R side Relieving factors: maybe stretching leg  PRECAUTIONS: None  WEIGHT BEARING RESTRICTIONS: No  FALLS:  Has patient fallen in last 6 months? No  OCCUPATION: Retired  PLOF: Independent  PATIENT GOALS: to see if it helps with pain  NEXT MD VISIT: none scheduled  OBJECTIVE:   DIAGNOSTIC FINDINGS:  MRI 08/06/22 IMPRESSION: 1. Severe spinal canal stenosis and right neural foraminal stenosis at L4-L5 due to combination of anterolisthesis and severe facet arthrosis. 2. Mild spinal canal stenosis at L2-3 and L3-4. 3. Right lateral recess stenosis at L2-3 and L3-4 could affect the right L3 and/or L4 nerve roots.  PATIENT SURVEYS: FOTO 87% function  SCREENING FOR RED FLAGS: Bowel or bladder incontinence: No Spinal tumors: No Cauda equina syndrome: No Compression fracture: No Abdominal aneurysm: No  COGNITION: Overall cognitive status: Within functional limits for tasks assessed     SENSATION:WFL  POSTURE: rounded shoulders, forward head, decreased lumbar lordosis, and decreased thoracic kyphosis  PALPATION: TTP R glute med/min and max, R lumbar paraspinals  LUMBAR ROM:   AROM eval  Flexion 0% limited  Extension 25% limited  Right lateral flexion 25% limited  Left lateral flexion 25% limited  Right rotation 0% limited *  Left rotation 0% limited   (Blank rows = not tested) *=pain  LOWER EXTREMITY ROM:     Active  Right eval Left eval  Hip flexion    Hip extension    Hip abduction    Hip adduction    Hip internal rotation    Hip external rotation    Knee flexion    Knee extension    Ankle dorsiflexion    Ankle plantarflexion    Ankle inversion    Ankle eversion     (Blank  rows = not tested)  LOWER EXTREMITY MMT:    MMT Right eval Left eval  Hip flexion 5 5  Hip extension 4 4+  Hip  abduction 4 * 4+  Hip adduction    Hip internal rotation    Hip external rotation    Knee flexion 5 5  Knee extension 5 5  Ankle dorsiflexion 5 5  Ankle plantarflexion    Ankle inversion    Ankle eversion     (Blank rows = not tested)   FUNCTIONAL TESTS:  5 times sit to stand: 12.57 seconds without UE use, relies slightly L >R 2 minute walk test: 430 feet  GAIT: Distance walked: 430 feet Assistive device utilized: None Level of assistance: Complete Independence Comments: , antalgic on RLE, R gluteal pain throughout  TODAY'S TREATMENT:                                                                                                                              DATE:  03/25/23 EVAL Education    PATIENT EDUCATION:  Education details: Patient educated on exam findings, POC, scope of PT, HEP, 1vs. 2x/ week POC and financial concerns, imaging related to symptoms, and hip/back symptoms. Person educated: Patient Education method: Explanation, Demonstration, and Handouts Education comprehension: verbalized understanding, returned demonstration, verbal cues required, and tactile cues required  HOME EXERCISE PROGRAM: Access Code: [email or web address] URL: [email or web address]  Date: 03/25/2023 - Beginner Bridge  - 2 x daily - 7 x weekly - 2 sets - 10 reps  ASSESSMENT:  CLINICAL IMPRESSION: Patient a 72 y.o. y.o. male who was seen today for physical therapy evaluation and treatment for LBP/R hip pain. Patient presents with pain limited deficits in lumbar spine/R hip strength, ROM, endurance, activity tolerance, gait, and functional mobility with ADL. Patient is having to modify and restrict ADL as indicated by outcome measure score as well as subjective information and objective measures which is affecting overall participation. Patient will benefit from skilled physical therapy in order to improve function and reduce impairment.  OBJECTIVE IMPAIRMENTS: decreased activity  tolerance, decreased endurance, decreased mobility, difficulty walking, decreased ROM, decreased strength, hypomobility, increased muscle spasms, impaired flexibility, improper body mechanics, postural dysfunction, and pain.   ACTIVITY LIMITATIONS: carrying, lifting, bending, standing, squatting, stairs, transfers, locomotion level, and caring for others  PARTICIPATION LIMITATIONS: meal prep, cleaning, laundry, shopping, community activity, and yard work  PERSONAL FACTORS: Time since onset of injury/illness/exacerbation and 1-2 comorbidities: HLD, HTN  are also affecting patient's functional outcome.   REHAB POTENTIAL: Good  CLINICAL DECISION MAKING: Stable/uncomplicated  EVALUATION COMPLEXITY: Low   GOALS: Goals reviewed with patient? Yes  SHORT TERM GOALS: Target date: 04/15/2023    Patient will be independent with HEP in order to improve functional outcomes. Baseline:  Goal status: INITIAL  2.  Patient will report at least 25% improvement in symptoms for improved quality of life. Baseline:  Goal status: INITIAL    LONG TERM GOALS: Target date:  05/06/2023    Patient will report at least 75% improvement in symptoms for improved quality of life. Baseline:  Goal status: INITIAL  2.  Patient will demonstrate grade of 5/5 MMT grade in all tested musculature as evidence of improved strength to assist with stair ambulation and gait.   Baseline: see above Goal status: INITIAL  3.  Patient will demonstrate at least 25% improvement in lumbar ROM in all restricted planes for improved ability to move trunk while completing chores. Baseline: see above Goal status: INITIAL  4.  Patient will be able to ambulate at least 450 feet in with improved gait mechanics in order to demonstrate improved tolerance to activity. Baseline: 430 feet , antalgic on RLE, R gluteal pain throughout Goal status: INITIAL  5.   Patient will be able to complete 5x STS in under 11.4 seconds with  equal LE weightbearing in order to demonstrate improved functional strength. Baseline: 12.57 seconds, relies L>R Goal status: INITIAL     PLAN:  PT FREQUENCY: 1x/week  PT DURATION: 6 weeks  PLANNED INTERVENTIONS: Therapeutic exercises, Therapeutic activity, Neuromuscular re-education, Balance training, Gait training, Patient/Family education, Joint manipulation, Joint mobilization, Stair training, Orthotic/Fit training, DME instructions, Aquatic Therapy, Dry Needling, Electrical stimulation, Spinal manipulation, Spinal mobilization, Cryotherapy, Moist heat, Compression bandaging, scar mobilization, Splintting, Taping, Traction, Ultrasound, Ionotophoresis 4mg /ml Dexamethasone, and Manual therapy  PLAN FOR NEXT SESSION: glute strength, core strength, functional strength   Reola Mosher Dawnita Molner, PT 03/25/2023, 9:04 AM

## 2023-03-27 ENCOUNTER — Other Ambulatory Visit: Payer: Self-pay | Admitting: *Deleted

## 2023-03-27 DIAGNOSIS — K429 Umbilical hernia without obstruction or gangrene: Secondary | ICD-10-CM

## 2023-03-31 ENCOUNTER — Encounter (HOSPITAL_COMMUNITY): Payer: Self-pay

## 2023-03-31 ENCOUNTER — Ambulatory Visit (HOSPITAL_COMMUNITY): Payer: PPO

## 2023-03-31 DIAGNOSIS — R29898 Other symptoms and signs involving the musculoskeletal system: Secondary | ICD-10-CM

## 2023-03-31 DIAGNOSIS — M5459 Other low back pain: Secondary | ICD-10-CM

## 2023-03-31 DIAGNOSIS — R2689 Other abnormalities of gait and mobility: Secondary | ICD-10-CM

## 2023-03-31 DIAGNOSIS — M6281 Muscle weakness (generalized): Secondary | ICD-10-CM

## 2023-03-31 DIAGNOSIS — M25551 Pain in right hip: Secondary | ICD-10-CM | POA: Diagnosis not present

## 2023-03-31 NOTE — Therapy (Signed)
OUTPATIENT PHYSICAL THERAPY THORACOLUMBAR TREATMENT   Patient Name: Frank [name] MRN: [id number] DOB:January 15, 1951, 72 y.o., male Today's Date: 03/31/2023  END OF SESSION:  PT End of Session - 03/31/23 1202     Visit Number 2    Number of Visits 6    Date for PT Re-Evaluation 05/06/23    Authorization Type [employer]    Progress Note Due on Visit 10    PT Start Time 1003    PT Stop Time 1045    PT Time Calculation (min) 42 min    Activity Tolerance Patient tolerated treatment well    Behavior During Therapy WFL for tasks assessed/performed              Past Medical History:  Diagnosis Date   Arthritis    Barrett's esophagus    BPH (benign prostatic hyperplasia)    Fracture of 5th metatarsal    GERD (gastroesophageal reflux disease)    Hypercholesteremia    Hypertension    Mastoiditis    Ulcer disease    Bulbar   Past Surgical History:  Procedure Laterality Date   BIOPSY  12/12/2021   Procedure: BIOPSY;  Surgeon: Malissa Hippo, MD;  [facility];  Service: Endoscopy;;   COLONOSCOPY     COLONOSCOPY     COLONOSCOPY N/A 08/27/2016   Procedure: COLONOSCOPY;  Surgeon: Malissa Hippo, MD;  [facility];  Service: Endoscopy;  Laterality: N/A;  930 - moved to 9/27 @ 12:00 - Ann notified pt   COLONOSCOPY N/A 12/12/2021   Procedure: COLONOSCOPY;  Surgeon: Malissa Hippo, MD;  [facility];  Service: Endoscopy;  Laterality: N/A;  730   HEMORRHOID SURGERY  2009   INGUINAL HERNIA REPAIR Right 10/15/2020   Procedure: OPEN RIGHT INGUINAL HERNIA REPAIR WITH MESH;  Surgeon: Ovidio Kin, MD;  Location: [facility] OR;  Service: General;  Laterality: Right;   INSERTION OF MESH Right 10/15/2020   Procedure: INSERTION OF MESH;  Surgeon: Ovidio Kin, MD;  Location: [facility] OR;  Service: General;  Laterality: Right;   KNEE ARTHROSCOPY     Right Knee   ORIF TOE FRACTURE Left 12/09/2013   Procedure: LEFT OPEN REDUCTION INTERNAL FIXATION (ORIF) FIFTH  METATARSAL FRACTURE;  Surgeon: Nilda Simmer, MD;  Location: [facility];  Service: Orthopedics;  Laterality: Left;   POLYPECTOMY  08/27/2016   Procedure: POLYPECTOMY;  Surgeon: Malissa Hippo, MD;  [facility];  Service: Endoscopy;;  colon   POLYPECTOMY  12/12/2021   Procedure: POLYPECTOMY;  Surgeon: Malissa Hippo, MD;  [facility];  Service: Endoscopy;;   rotor cuff  2009   right-   TONSILLECTOMY     UPPER GASTROINTESTINAL ENDOSCOPY     Patient Active Problem List   Diagnosis Date Noted   Left foot pain 02/14/2014   Fracture of 5th metatarsal    BPH (benign prostatic hyperplasia)    Mastoiditis    Hypertension    Barrett's esophagus    Ulcer disease    GERD (gastroesophageal reflux disease) 05/24/2012   HTN (hypertension) 05/24/2012   History of colonic polyps 05/24/2012    PCP: Carylon Perches MD  REFERRING PROVIDER: Tressie Stalker, MD  REFERRING DIAG: [phone] (ICD-10-CM) - Spondylosis without myelopathy or radiculopathy, lumbar region  Rationale for Evaluation and Treatment: Rehabilitation  THERAPY DIAG:  Pain in right hip  Other low back pain  Muscle weakness (generalized)  Other abnormalities of gait and mobility  Other  symptoms and signs involving the musculoskeletal system  ONSET DATE: about 6 months  SUBJECTIVE:                                                                                                                                                                                           SUBJECTIVE STATEMENT:  03/31/23:  Pt stated he is feeling good right now, no reports of pain.  Does have intermittent radicular symptoms down Rt lateral leg.    Eval:Patient states low back pain that begin about 6 months ago with insidious onset. Gradual worsening since then. Symptoms increase with laying on side. Pain in R hip/low back but mostly hip. He had a few injections in hip that did not help. Symptoms may decrease with  stretching leg. Walking is getting better. Symptoms come and go. Throbbing symptoms down R lateral leg down to calf that started about a week ago.   PERTINENT HISTORY:  HLD, HTN  PAIN:  Are you having pain? Yes: NPRS scale: 2/10 Pain location: R hip/low back Pain description: burning/unsure Aggravating factors: laying on R side Relieving factors: maybe stretching leg  PRECAUTIONS: None  WEIGHT BEARING RESTRICTIONS: No  FALLS:  Has patient fallen in last 6 months? No  OCCUPATION: Retired  PLOF: Independent  PATIENT GOALS: to see if it helps with pain  NEXT MD VISIT: none scheduled  OBJECTIVE:   DIAGNOSTIC FINDINGS:  MRI 08/06/22 IMPRESSION: 1. Severe spinal canal stenosis and right neural foraminal stenosis at L4-L5 due to combination of anterolisthesis and severe facet arthrosis. 2. Mild spinal canal stenosis at L2-3 and L3-4. 3. Right lateral recess stenosis at L2-3 and L3-4 could affect the right L3 and/or L4 nerve roots.  PATIENT SURVEYS: FOTO 87% function  SCREENING FOR RED FLAGS: Bowel or bladder incontinence: No Spinal tumors: No Cauda equina syndrome: No Compression fracture: No Abdominal aneurysm: No  COGNITION: Overall cognitive status: Within functional limits for tasks assessed     SENSATION:WFL  POSTURE: rounded shoulders, forward head, decreased lumbar lordosis, and decreased thoracic kyphosis  PALPATION: TTP R glute med/min and max, R lumbar paraspinals  LUMBAR ROM:   AROM eval  Flexion 0% limited  Extension 25% limited  Right lateral flexion 25% limited  Left lateral flexion 25% limited  Right rotation 0% limited *  Left rotation 0% limited   (Blank rows = not tested) *=pain  LOWER EXTREMITY ROM:     Active  Right eval Left eval  Hip flexion    Hip extension    Hip abduction    Hip adduction    Hip internal rotation    Hip external rotation  Knee flexion    Knee extension    Ankle dorsiflexion    Ankle plantarflexion     Ankle inversion    Ankle eversion     (Blank rows = not tested)  LOWER EXTREMITY MMT:    MMT Right eval Left eval  Hip flexion 5 5  Hip extension 4 4+  Hip abduction 4 * 4+  Hip adduction    Hip internal rotation    Hip external rotation    Knee flexion 5 5  Knee extension 5 5  Ankle dorsiflexion 5 5  Ankle plantarflexion    Ankle inversion    Ankle eversion     (Blank rows = not tested)   FUNCTIONAL TESTS:  5 times sit to stand: 12.57 seconds without UE use, relies slightly L >R 2 minute walk test: 430 feet  GAIT: Distance walked: 430 feet Assistive device utilized: None Level of assistance: Complete Independence Comments: , antalgic on RLE, R gluteal pain throughout  TODAY'S TREATMENT:                                                                                                                              DATE:  03/31/23: Reviewed goals HEP compliance  Supine: Bridge Deep breathing paired with ab set upon exhale 2' Marching with ab set 10x each LTR 5x 10" Piriformis 90/90 with towel assistance 2x 30" SKTC 2x 20"  Sidelying:  Clam 10x 5"   03/25/23 EVAL Education    PATIENT EDUCATION:  Education details: Patient educated on exam findings, POC, scope of PT, HEP, 1vs. 2x/ week POC and financial concerns, imaging related to symptoms, and hip/back symptoms. Person educated: Patient Education method: Explanation, Demonstration, and Handouts Education comprehension: verbalized understanding, returned demonstration, verbal cues required, and tactile cues required  HOME EXERCISE PROGRAM: Access Code: [email or web address] URL: [email or web address]  Date: 03/25/2023 - Beginner Bridge  - 2 x daily - 7 x weekly - 2 sets - 10 reps  03/31/23: Access Code: [email or web address] URL: [email or web address] Date: 03/31/2023 Prepared by: Becky Sax  Exercises - Beginner Bridge  - 2 x daily - 7 x weekly - 2 sets - 10 reps - Supine Transversus  Abdominis Bracing - Hands on Stomach  - 1 x daily - 7 x weekly - 3 sets - 10 reps - 5" hold - Sit to Stand Without Arm Support  - 1 x daily - 7 x weekly - 3 sets - 10 reps - Supine Lower Trunk Rotation  - 1 x daily - 7 x weekly - 3 sets - 5 reps - 10" hold - Clamshell  - 1 x daily - 7 x weekly - 3 sets - 10 reps - 5" hold - Hooklying Single Knee to Chest Stretch  - 1 x daily - 7 x weekly - 3 sets - 3 reps - 30" hold  ASSESSMENT:  CLINICAL IMPRESSION: Reviewed goals, educated importance of HEP compliance for maximal benefits.  Pt able to recall  and demonstrate currently.  Session focus with core and proximal strengthening as well as lumbar mobility in pain free range.  Pt reports some discomfort while laying on right hip, monitored through session.  Added core and proximal strengthening exercises and stretches to HEP with printout given and verbalized understanding at EOS.  Eval:  Patient a 72 y.o. y.o. male who was seen today for physical therapy evaluation and treatment for LBP/R hip pain. Patient presents with pain limited deficits in lumbar spine/R hip strength, ROM, endurance, activity tolerance, gait, and functional mobility with ADL. Patient is having to modify and restrict ADL as indicated by outcome measure score as well as subjective information and objective measures which is affecting overall participation. Patient will benefit from skilled physical therapy in order to improve function and reduce impairment.  OBJECTIVE IMPAIRMENTS: decreased activity tolerance, decreased endurance, decreased mobility, difficulty walking, decreased ROM, decreased strength, hypomobility, increased muscle spasms, impaired flexibility, improper body mechanics, postural dysfunction, and pain.   ACTIVITY LIMITATIONS: carrying, lifting, bending, standing, squatting, stairs, transfers, locomotion level, and caring for others  PARTICIPATION LIMITATIONS: meal prep, cleaning, laundry, shopping, community activity,  and yard work  PERSONAL FACTORS: Time since onset of injury/illness/exacerbation and 1-2 comorbidities: HLD, HTN  are also affecting patient's functional outcome.   REHAB POTENTIAL: Good  CLINICAL DECISION MAKING: Stable/uncomplicated  EVALUATION COMPLEXITY: Low   GOALS: Goals reviewed with patient? Yes  SHORT TERM GOALS: Target date: 04/15/2023    Patient will be independent with HEP in order to improve functional outcomes. Baseline:  Goal status: IN PROGRESS  2.  Patient will report at least 25% improvement in symptoms for improved quality of life. Baseline:  Goal status: IN PROGRESS    LONG TERM GOALS: Target date: 05/06/2023    Patient will report at least 75% improvement in symptoms for improved quality of life. Baseline:  Goal status: IN PROGRESS  2.  Patient will demonstrate grade of 5/5 MMT grade in all tested musculature as evidence of improved strength to assist with stair ambulation and gait.   Baseline: see above Goal status: IN PROGRESS  3.  Patient will demonstrate at least 25% improvement in lumbar ROM in all restricted planes for improved ability to move trunk while completing chores. Baseline: see above Goal status: IN PROGRESS  4.  Patient will be able to ambulate at least 450 feet in with improved gait mechanics in order to demonstrate improved tolerance to activity. Baseline: 430 feet , antalgic on RLE, R gluteal pain throughout Goal status: IN PROGRESS  5.   Patient will be able to complete 5x STS in under 11.4 seconds with equal LE weightbearing in order to demonstrate improved functional strength. Baseline: 12.57 seconds, relies L>R Goal status: IN PROGRESS     PLAN:  PT FREQUENCY: 1x/week  PT DURATION: 6 weeks  PLANNED INTERVENTIONS: Therapeutic exercises, Therapeutic activity, Neuromuscular re-education, Balance training, Gait training, Patient/Family education, Joint manipulation, Joint mobilization, Stair training,  Orthotic/Fit training, DME instructions, Aquatic Therapy, Dry Needling, Electrical stimulation, Spinal manipulation, Spinal mobilization, Cryotherapy, Moist heat, Compression bandaging, scar mobilization, Splintting, Taping, Traction, Ultrasound, Ionotophoresis 4mg /ml Dexamethasone, and Manual therapy  PLAN FOR NEXT SESSION: glute strength, core strength, functional strength.  Becky Sax, LPTA/CLT; CBIS [phone]  Juel Burrow, PTA 03/31/2023, 3:47 PM

## 2023-04-02 ENCOUNTER — Encounter: Payer: Self-pay | Admitting: General Surgery

## 2023-04-02 ENCOUNTER — Ambulatory Visit (INDEPENDENT_AMBULATORY_CARE_PROVIDER_SITE_OTHER): Payer: PPO | Admitting: General Surgery

## 2023-04-02 ENCOUNTER — Other Ambulatory Visit: Payer: Self-pay

## 2023-04-02 VITALS — BP 125/80 | HR 74 | Temp 97.8°F | Resp 18 | Ht 70.5 in | Wt 189.0 lb

## 2023-04-02 DIAGNOSIS — K429 Umbilical hernia without obstruction or gangrene: Secondary | ICD-10-CM

## 2023-04-02 DIAGNOSIS — M47816 Spondylosis without myelopathy or radiculopathy, lumbar region: Secondary | ICD-10-CM | POA: Insufficient documentation

## 2023-04-03 NOTE — Progress Notes (Signed)
Frank [name]; [id number]; July 19, 1951   HPI Patient is a 72 year old white male who was referred to my care by Dr. Carylon Perches for evaluation and treatment of an umbilical hernia.  It has been present for some time.  Recently seems to have increased in size and is causing him discomfort.  He has never had surgery in that area.  It occasionally bothers him. Past Medical History:  Diagnosis Date   Arthritis    Barrett's esophagus    BPH (benign prostatic hyperplasia)    Fracture of 5th metatarsal    GERD (gastroesophageal reflux disease)    Hypercholesteremia    Hypertension    Mastoiditis    Ulcer disease    Bulbar    Past Surgical History:  Procedure Laterality Date   BIOPSY  12/12/2021   Procedure: BIOPSY;  Surgeon: Malissa Hippo, MD;  [facility];  Service: Endoscopy;;   COLONOSCOPY     COLONOSCOPY     COLONOSCOPY N/A 08/27/2016   Procedure: COLONOSCOPY;  Surgeon: Malissa Hippo, MD;  [facility];  Service: Endoscopy;  Laterality: N/A;  930 - moved to 9/27 @ 12:00 - Ann notified pt   COLONOSCOPY N/A 12/12/2021   Procedure: COLONOSCOPY;  Surgeon: Malissa Hippo, MD;  [facility];  Service: Endoscopy;  Laterality: N/A;  730   HEMORRHOID SURGERY  2009   INGUINAL HERNIA REPAIR Right 10/15/2020   Procedure: OPEN RIGHT INGUINAL HERNIA REPAIR WITH MESH;  Surgeon: Ovidio Kin, MD;  Location: [facility] OR;  Service: General;  Laterality: Right;   INSERTION OF MESH Right 10/15/2020   Procedure: INSERTION OF MESH;  Surgeon: Ovidio Kin, MD;  Location: [facility] OR;  Service: General;  Laterality: Right;   KNEE ARTHROSCOPY     Right Knee   ORIF TOE FRACTURE Left 12/09/2013   Procedure: LEFT OPEN REDUCTION INTERNAL FIXATION (ORIF) FIFTH METATARSAL FRACTURE;  Surgeon: Nilda Simmer, MD;  Location: [facility];  Service: Orthopedics;  Laterality: Left;   POLYPECTOMY  08/27/2016   Procedure: POLYPECTOMY;  Surgeon: Malissa Hippo, MD;  [facility];  Service: Endoscopy;;  colon   POLYPECTOMY  12/12/2021   Procedure: POLYPECTOMY;  Surgeon: Malissa Hippo, MD;  [facility];  Service: Endoscopy;;   rotor cuff  2009   right-   TONSILLECTOMY     UPPER GASTROINTESTINAL ENDOSCOPY      Family History  Problem Relation Age of Onset   COPD Mother    Heart disease Father    Healthy Sister    Lung cancer Brother    Heart disease Brother    Heart disease Sister    Prostate cancer Brother    Healthy Son    Healthy Son     Current Outpatient Medications on File Prior to Visit  Medication Sig Dispense Refill   amLODipine (NORVASC) 5 MG tablet Take 5 mg by mouth daily.     aspirin EC 81 MG tablet Take 1 tablet (81 mg total) by mouth daily. Swallow whole. 30 tablet 11   chlorthalidone (HYGROTON) 25 MG tablet Take 25 mg by mouth daily.     ezetimibe (ZETIA) 10 MG tablet Take 10 mg by mouth daily.     Ginger, Zingiber officinalis, (GINGER PO) Take 1 capsule by mouth daily.     Glucosamine HCl 1000 MG TABS Take 2,000 mg by mouth daily.     losartan (COZAAR) 100 MG tablet Take 100 mg by mouth  daily.     Misc Natural Products (TART CHERRY ADVANCED PO) Take 1 tablet by mouth daily.     Multiple Vitamin (MULTIVITAMIN WITH MINERALS) TABS tablet Take 1 tablet by mouth daily.     Omega-3 Fatty Acids (FISH OIL) 1000 MG CAPS Take 1,000 mg by mouth daily.     omeprazole (PRILOSEC) 20 MG capsule Take 20 mg by mouth daily.      TURMERIC PO Take 1 capsule by mouth daily.     No current facility-administered medications on file prior to visit.    Allergies  Allergen Reactions   Metformin    Ampicillin Rash and Other (See Comments)    Social History   Substance and Sexual Activity  Alcohol Use No    Social History   Tobacco Use  Smoking Status Never  Smokeless Tobacco Never    Review of Systems  Constitutional: Negative.   HENT: Negative.    Eyes: Negative.   Respiratory: Negative.    Cardiovascular: Negative.    Gastrointestinal: Negative.   Genitourinary:  Positive for frequency.  Musculoskeletal:  Positive for back pain and joint pain.  Skin: Negative.   Neurological: Negative.   Endo/Heme/Allergies: Negative.   Psychiatric/Behavioral: Negative.      Objective   Vitals:   04/02/23 1140  BP: 125/80  Pulse: 74  Resp: 18  Temp: 97.8 F (36.6 C)  SpO2: 94%    Physical Exam Vitals reviewed.  Constitutional:      Appearance: Normal appearance. He is normal weight. He is not ill-appearing.  HENT:     Head: Normocephalic and atraumatic.  Cardiovascular:     Rate and Rhythm: Normal rate and regular rhythm.     Heart sounds: Normal heart sounds. No murmur heard.    No friction rub. No gallop.  Pulmonary:     Effort: Pulmonary effort is normal. No respiratory distress.     Breath sounds: Normal breath sounds. No stridor. No wheezing, rhonchi or rales.  Abdominal:     General: Bowel sounds are normal. There is no distension.     Palpations: Abdomen is soft. There is no mass.     Tenderness: There is no abdominal tenderness. There is no guarding or rebound.     Hernia: A hernia is present.     Comments: A 3.5 cm easily reducible umbilical hernia is present.  This does cause him some discomfort when palpated.  Skin:    General: Skin is warm and dry.  Neurological:     Mental Status: He is alert and oriented to person, place, and time.    Primary care notes reviewed Assessment  Umbilical hernia Plan  I told the patient that he should get the umbilical hernia fixed when he is getting more symptomatic.  He would like to think about the procedure.  I did explain what a robotic assisted umbilical herniorrhaphy with mesh was.  He will call me if he decides to have the procedure performed.  Literature was given.

## 2023-04-08 ENCOUNTER — Encounter (HOSPITAL_COMMUNITY): Payer: Self-pay | Admitting: Physical Therapy

## 2023-04-08 ENCOUNTER — Ambulatory Visit (HOSPITAL_COMMUNITY): Payer: PPO | Attending: Neurosurgery | Admitting: Physical Therapy

## 2023-04-08 ENCOUNTER — Ambulatory Visit (HOSPITAL_COMMUNITY): Payer: PPO | Admitting: Physical Therapy

## 2023-04-08 DIAGNOSIS — R2689 Other abnormalities of gait and mobility: Secondary | ICD-10-CM

## 2023-04-08 DIAGNOSIS — M6281 Muscle weakness (generalized): Secondary | ICD-10-CM | POA: Diagnosis not present

## 2023-04-08 DIAGNOSIS — M5459 Other low back pain: Secondary | ICD-10-CM | POA: Diagnosis not present

## 2023-04-08 DIAGNOSIS — M25551 Pain in right hip: Secondary | ICD-10-CM | POA: Diagnosis not present

## 2023-04-08 DIAGNOSIS — R29898 Other symptoms and signs involving the musculoskeletal system: Secondary | ICD-10-CM | POA: Diagnosis not present

## 2023-04-08 NOTE — Therapy (Signed)
OUTPATIENT PHYSICAL THERAPY THORACOLUMBAR TREATMENT   Patient Name: Frank [name] MRN: [id number] DOB:1951/10/23, 72 y.o., male Today's Date: 04/08/2023  END OF SESSION:  PT End of Session - 04/08/23 1119     Visit Number 3    Number of Visits 6    Date for PT Re-Evaluation 05/06/23    Authorization Type [employer]    Progress Note Due on Visit 10    PT Start Time 1119    PT Stop Time 1157    PT Time Calculation (min) 38 min    Activity Tolerance Patient tolerated treatment well    Behavior During Therapy WFL for tasks assessed/performed              Past Medical History:  Diagnosis Date   Arthritis    Barrett's esophagus    BPH (benign prostatic hyperplasia)    Fracture of 5th metatarsal    GERD (gastroesophageal reflux disease)    Hypercholesteremia    Hypertension    Mastoiditis    Ulcer disease    Bulbar   Past Surgical History:  Procedure Laterality Date   BIOPSY  12/12/2021   Procedure: BIOPSY;  Surgeon: Malissa Hippo, MD;  [facility];  Service: Endoscopy;;   COLONOSCOPY     COLONOSCOPY     COLONOSCOPY N/A 08/27/2016   Procedure: COLONOSCOPY;  Surgeon: Malissa Hippo, MD;  [facility];  Service: Endoscopy;  Laterality: N/A;  930 - moved to 9/27 @ 12:00 - Ann notified pt   COLONOSCOPY N/A 12/12/2021   Procedure: COLONOSCOPY;  Surgeon: Malissa Hippo, MD;  [facility];  Service: Endoscopy;  Laterality: N/A;  730   HEMORRHOID SURGERY  2009   INGUINAL HERNIA REPAIR Right 10/15/2020   Procedure: OPEN RIGHT INGUINAL HERNIA REPAIR WITH MESH;  Surgeon: Ovidio Kin, MD;  Location: [facility] OR;  Service: General;  Laterality: Right;   INSERTION OF MESH Right 10/15/2020   Procedure: INSERTION OF MESH;  Surgeon: Ovidio Kin, MD;  Location: [facility] OR;  Service: General;  Laterality: Right;   KNEE ARTHROSCOPY     Right Knee   ORIF TOE FRACTURE Left 12/09/2013   Procedure: LEFT OPEN REDUCTION INTERNAL FIXATION (ORIF) FIFTH  METATARSAL FRACTURE;  Surgeon: Nilda Simmer, MD;  Location: [facility];  Service: Orthopedics;  Laterality: Left;   POLYPECTOMY  08/27/2016   Procedure: POLYPECTOMY;  Surgeon: Malissa Hippo, MD;  [facility];  Service: Endoscopy;;  colon   POLYPECTOMY  12/12/2021   Procedure: POLYPECTOMY;  Surgeon: Malissa Hippo, MD;  [facility];  Service: Endoscopy;;   rotor cuff  2009   right-   TONSILLECTOMY     UPPER GASTROINTESTINAL ENDOSCOPY     Patient Active Problem List   Diagnosis Date Noted   Arthropathy of lumbar facet joint 04/02/2023   History of bilateral mastoidectomy 09/19/2021   Mastoid disorder, left 09/16/2019   Bilateral sensorineural hearing loss 09/16/2019   Left foot pain 02/14/2014   Fracture of 5th metatarsal    BPH (benign prostatic hyperplasia)    Hypertension    Barrett's esophagus    Ulcer disease    GERD (gastroesophageal reflux disease) 05/24/2012   HTN (hypertension) 05/24/2012   History of colonic polyps 05/24/2012    PCP: Carylon Perches MD  REFERRING PROVIDER: Tressie Stalker, MD  REFERRING DIAG: [phone] (ICD-10-CM) - Spondylosis without myelopathy or radiculopathy, lumbar region  Rationale for Evaluation and Treatment: Rehabilitation  THERAPY  DIAG:  Pain in right hip  Other low back pain  Muscle weakness (generalized)  Other abnormalities of gait and mobility  Other symptoms and signs involving the musculoskeletal system  ONSET DATE: about 6 months  SUBJECTIVE:                                                                                                                                                                                           SUBJECTIVE STATEMENT:  Patient states things don't seem to be better. Pain with exercises.  Eval:Patient states low back pain that begin about 6 months ago with insidious onset. Gradual worsening since then. Symptoms increase with laying on side. Pain in R hip/low  back but mostly hip. He had a few injections in hip that did not help. Symptoms may decrease with stretching leg. Walking is getting better. Symptoms come and go. Throbbing symptoms down R lateral leg down to calf that started about a week ago.   PERTINENT HISTORY:  HLD, HTN  PAIN:  Are you having pain? Yes: NPRS scale: 2/10 Pain location: R hip/low back Pain description: burning/unsure Aggravating factors: laying on R side Relieving factors: maybe stretching leg  PRECAUTIONS: None  WEIGHT BEARING RESTRICTIONS: No  FALLS:  Has patient fallen in last 6 months? No  OCCUPATION: Retired  PLOF: Independent  PATIENT GOALS: to see if it helps with pain  NEXT MD VISIT: none scheduled  OBJECTIVE:   DIAGNOSTIC FINDINGS:  MRI 08/06/22 IMPRESSION: 1. Severe spinal canal stenosis and right neural foraminal stenosis at L4-L5 due to combination of anterolisthesis and severe facet arthrosis. 2. Mild spinal canal stenosis at L2-3 and L3-4. 3. Right lateral recess stenosis at L2-3 and L3-4 could affect the right L3 and/or L4 nerve roots.  PATIENT SURVEYS: FOTO 87% function  SCREENING FOR RED FLAGS: Bowel or bladder incontinence: No Spinal tumors: No Cauda equina syndrome: No Compression fracture: No Abdominal aneurysm: No  COGNITION: Overall cognitive status: Within functional limits for tasks assessed     SENSATION:WFL  POSTURE: rounded shoulders, forward head, decreased lumbar lordosis, and decreased thoracic kyphosis  PALPATION: TTP R glute med/min and max, R lumbar paraspinals  LUMBAR ROM:   AROM eval  Flexion 0% limited  Extension 25% limited  Right lateral flexion 25% limited  Left lateral flexion 25% limited  Right rotation 0% limited *  Left rotation 0% limited   (Blank rows = not tested) *=pain  LOWER EXTREMITY ROM:     Active  Right eval Left eval  Hip flexion    Hip extension    Hip abduction    Hip adduction    Hip  internal rotation    Hip  external rotation    Knee flexion    Knee extension    Ankle dorsiflexion    Ankle plantarflexion    Ankle inversion    Ankle eversion     (Blank rows = not tested)  LOWER EXTREMITY MMT:    MMT Right eval Left eval  Hip flexion 5 5  Hip extension 4 4+  Hip abduction 4 * 4+  Hip adduction    Hip internal rotation    Hip external rotation    Knee flexion 5 5  Knee extension 5 5  Ankle dorsiflexion 5 5  Ankle plantarflexion    Ankle inversion    Ankle eversion     (Blank rows = not tested)   FUNCTIONAL TESTS:  5 times sit to stand: 12.57 seconds without UE use, relies slightly L >R 2 minute walk test: 430 feet  GAIT: Distance walked: 430 feet Assistive device utilized: None Level of assistance: Complete Independence Comments: , antalgic on RLE, R gluteal pain throughout  TODAY'S TREATMENT:                                                                                                                              DATE:  04/08/23 Supine piriformis stretch 2 x 20 second holds Bridge 2 x 10  Sidelying hip abduction 2 x 10 bilateral Prone hip extension 2 x 10 bilateral  DKTC with heels on green ball 2 x 10 with 5 second holds STS 2 x 10  Standing hip abduction 2 x 10 bilateral  Step up 4 inch 2 x 10 bilateral Lateral step up 4 inch 1 x 10  bilateral   03/31/23: Reviewed goals HEP compliance  Supine: Bridge Deep breathing paired with ab set upon exhale 2' Marching with ab set 10x each LTR 5x 10" Piriformis 90/90 with towel assistance 2x 30" SKTC 2x 20"  Sidelying:  Clam 10x 5"   03/25/23 EVAL Education    PATIENT EDUCATION:  Education details: 04/08/23 HEP; EVAL: Patient educated on exam findings, POC, scope of PT, HEP, 1vs. 2x/ week POC and financial concerns, imaging related to symptoms, and hip/back symptoms. Person educated: Patient Education method: Explanation, Demonstration, and Handouts Education comprehension: verbalized understanding,  returned demonstration, verbal cues required, and tactile cues required  HOME EXERCISE PROGRAM: Access Code: [email or web address] URL: [email or web address]  04/08/23- Sidelying Hip Abduction  - 1 x daily - 7 x weekly - 2 sets - 10 reps - Prone Hip Extension  - 1 x daily - 7 x weekly - 2 sets - 10 reps - Sit to Stand with Arms Crossed  - 1 x daily - 7 x weekly - 2 sets - 10 reps  03/31/23: - Beginner Bridge  - 2 x daily - 7 x weekly - 2 sets - 10 reps - Supine Transversus Abdominis Bracing - Hands on Stomach  - 1 x daily - 7 x weekly - 3 sets -  10 reps - 5" hold - Sit to Stand Without Arm Support  - 1 x daily - 7 x weekly - 3 sets - 10 reps - Supine Lower Trunk Rotation  - 1 x daily - 7 x weekly - 3 sets - 5 reps - 10" hold - Clamshell  - 1 x daily - 7 x weekly - 3 sets - 10 reps - 5" hold - Hooklying Single Knee to Chest Stretch  - 1 x daily - 7 x weekly - 3 sets - 3 reps - 30" hold  Date: 03/25/2023 - Beginner Bridge  - 2 x daily - 7 x weekly - 2 sets - 10 reps    ASSESSMENT:  CLINICAL IMPRESSION: Continued with glute and core strengthening which is tolerated well. Multimodial cueing required for exercise mechanics with good/fair carry over. Relies on LLE>RLE with STS due to RLE weakness and mild pain. Trial of 7 inch step up with gluteal pain and compensation, mechanics improve with 4 inch step. Patient will continue to benefit from physical therapy in order to improve function and reduce impairment.   OBJECTIVE IMPAIRMENTS: decreased activity tolerance, decreased endurance, decreased mobility, difficulty walking, decreased ROM, decreased strength, hypomobility, increased muscle spasms, impaired flexibility, improper body mechanics, postural dysfunction, and pain.   ACTIVITY LIMITATIONS: carrying, lifting, bending, standing, squatting, stairs, transfers, locomotion level, and caring for others  PARTICIPATION LIMITATIONS: meal prep, cleaning, laundry, shopping, community activity,  and yard work  PERSONAL FACTORS: Time since onset of injury/illness/exacerbation and 1-2 comorbidities: HLD, HTN  are also affecting patient's functional outcome.   REHAB POTENTIAL: Good  CLINICAL DECISION MAKING: Stable/uncomplicated  EVALUATION COMPLEXITY: Low   GOALS: Goals reviewed with patient? Yes  SHORT TERM GOALS: Target date: 04/15/2023    Patient will be independent with HEP in order to improve functional outcomes. Baseline:  Goal status: IN PROGRESS  2.  Patient will report at least 25% improvement in symptoms for improved quality of life. Baseline:  Goal status: IN PROGRESS    LONG TERM GOALS: Target date: 05/06/2023    Patient will report at least 75% improvement in symptoms for improved quality of life. Baseline:  Goal status: IN PROGRESS  2.  Patient will demonstrate grade of 5/5 MMT grade in all tested musculature as evidence of improved strength to assist with stair ambulation and gait.   Baseline: see above Goal status: IN PROGRESS  3.  Patient will demonstrate at least 25% improvement in lumbar ROM in all restricted planes for improved ability to move trunk while completing chores. Baseline: see above Goal status: IN PROGRESS  4.  Patient will be able to ambulate at least 450 feet in with improved gait mechanics in order to demonstrate improved tolerance to activity. Baseline: 430 feet , antalgic on RLE, R gluteal pain throughout Goal status: IN PROGRESS  5.   Patient will be able to complete 5x STS in under 11.4 seconds with equal LE weightbearing in order to demonstrate improved functional strength. Baseline: 12.57 seconds, relies L>R Goal status: IN PROGRESS     PLAN:  PT FREQUENCY: 1x/week  PT DURATION: 6 weeks  PLANNED INTERVENTIONS: Therapeutic exercises, Therapeutic activity, Neuromuscular re-education, Balance training, Gait training, Patient/Family education, Joint manipulation, Joint mobilization, Stair training,  Orthotic/Fit training, DME instructions, Aquatic Therapy, Dry Needling, Electrical stimulation, Spinal manipulation, Spinal mobilization, Cryotherapy, Moist heat, Compression bandaging, scar mobilization, Splintting, Taping, Traction, Ultrasound, Ionotophoresis 4mg /ml Dexamethasone, and Manual therapy  PLAN FOR NEXT SESSION: glute strength, core strength, functional strength.  Reola Mosher Rhona Fusilier, PT 04/08/2023, 11:19 AM

## 2023-04-15 ENCOUNTER — Encounter (HOSPITAL_COMMUNITY): Payer: PPO

## 2023-04-22 ENCOUNTER — Encounter (HOSPITAL_COMMUNITY): Payer: PPO

## 2023-04-29 ENCOUNTER — Encounter (HOSPITAL_COMMUNITY): Payer: PPO | Admitting: Physical Therapy

## 2023-04-30 DIAGNOSIS — L570 Actinic keratosis: Secondary | ICD-10-CM | POA: Diagnosis not present

## 2023-04-30 DIAGNOSIS — L578 Other skin changes due to chronic exposure to nonionizing radiation: Secondary | ICD-10-CM | POA: Diagnosis not present

## 2023-04-30 DIAGNOSIS — B353 Tinea pedis: Secondary | ICD-10-CM | POA: Diagnosis not present

## 2023-04-30 DIAGNOSIS — L219 Seborrheic dermatitis, unspecified: Secondary | ICD-10-CM | POA: Diagnosis not present

## 2023-04-30 DIAGNOSIS — D1801 Hemangioma of skin and subcutaneous tissue: Secondary | ICD-10-CM | POA: Diagnosis not present

## 2023-04-30 DIAGNOSIS — L821 Other seborrheic keratosis: Secondary | ICD-10-CM | POA: Diagnosis not present

## 2023-04-30 DIAGNOSIS — L814 Other melanin hyperpigmentation: Secondary | ICD-10-CM | POA: Diagnosis not present

## 2023-04-30 DIAGNOSIS — D225 Melanocytic nevi of trunk: Secondary | ICD-10-CM | POA: Diagnosis not present

## 2023-04-30 DIAGNOSIS — B351 Tinea unguium: Secondary | ICD-10-CM | POA: Diagnosis not present

## 2023-05-01 DIAGNOSIS — H903 Sensorineural hearing loss, bilateral: Secondary | ICD-10-CM | POA: Diagnosis not present

## 2023-05-01 DIAGNOSIS — Z9089 Acquired absence of other organs: Secondary | ICD-10-CM | POA: Diagnosis not present

## 2023-05-01 DIAGNOSIS — Z9889 Other specified postprocedural states: Secondary | ICD-10-CM | POA: Diagnosis not present

## 2023-05-01 DIAGNOSIS — H90A32 Mixed conductive and sensorineural hearing loss, unilateral, left ear with restricted hearing on the contralateral side: Secondary | ICD-10-CM | POA: Diagnosis not present

## 2023-05-01 DIAGNOSIS — H906 Mixed conductive and sensorineural hearing loss, bilateral: Secondary | ICD-10-CM | POA: Diagnosis not present

## 2023-05-01 DIAGNOSIS — H7493 Unspecified disorder of middle ear and mastoid, bilateral: Secondary | ICD-10-CM | POA: Diagnosis not present

## 2023-05-01 DIAGNOSIS — Z01118 Encounter for examination of ears and hearing with other abnormal findings: Secondary | ICD-10-CM | POA: Diagnosis not present

## 2023-05-05 ENCOUNTER — Encounter (HOSPITAL_COMMUNITY): Payer: PPO | Admitting: Physical Therapy

## 2023-05-18 ENCOUNTER — Other Ambulatory Visit: Payer: Self-pay | Admitting: Neurosurgery

## 2023-05-26 ENCOUNTER — Other Ambulatory Visit: Payer: Self-pay | Admitting: Neurosurgery

## 2023-05-27 NOTE — Pre-Procedure Instructions (Signed)
Surgical Instructions    Your procedure is scheduled on Thursday, July 11.    Report to [facility] Main Entrance "A" at 0800 A.M., then check in with the Admitting office.  Call this number if you have problems the morning of surgery:  [phone]  If you have any questions prior to your surgery date call [phone]: Open Monday-Friday 8am-4pm If you experience any cold or flu symptoms such as cough, fever, chills, shortness of breath, etc. between now and your scheduled surgery, please notify us at the above number.     Remember:  Do not eat after midnight the night before your surgery    Take these medicines the morning of surgery with A SIP OF WATER:             amLODipine (NORVASC)              omeprazole (PRILOSEC)    Follow your surgeon's instructions on when to stop Aspirin.  If no instructions were given by your surgeon then you will need to call the office to get those instructions.    As of today, STOP taking any Aleve, Naproxen, Ibuprofen, Motrin, Advil, Goody's, BC's, all herbal medications, fish oil, and all vitamins.                     Do NOT Smoke (Tobacco/Vaping) for 24 hours prior to your procedure.  If you use a CPAP at night, you may bring your mask/headgear for your overnight stay.   Contacts, glasses, piercing's, hearing aid's, dentures or partials may not be worn into surgery, please bring cases for these belongings.    For patients admitted to the hospital, discharge time will be determined by your treatment team.   Patients discharged the day of surgery will not be allowed to drive home, and someone needs to stay with them for 24 hours.  SURGICAL WAITING ROOM VISITATION Patients having surgery or a procedure may have no more than 2 support people in the waiting area - these visitors may rotate.   Children under the age of 84 must have an adult with them who is not the patient. If the patient needs to stay at the hospital during part of their  recovery, the visitor guidelines for inpatient rooms apply. Pre-op nurse will coordinate an appropriate time for 1 support person to accompany patient in pre-op.  This support person may not rotate.   Please refer to the [facility] website for the visitor guidelines for Inpatients (after your surgery is over and you are in a regular room).    Special instructions:       Pre-operative 5 CHG Bath Instructions   You can play a key role in reducing the risk of infection after surgery. Your skin needs to be as free of germs as possible. You can reduce the number of germs on your skin by washing with CHG (chlorhexidine gluconate) soap before surgery. CHG is an antiseptic soap that kills germs and continues to kill germs even after washing.   DO NOT use if you have an allergy to chlorhexidine/CHG or antibacterial soaps. If your skin becomes reddened or irritated, stop using the CHG and notify one of our RNs at [phone].   Please shower with the CHG soap starting 4 days before surgery using the following schedule:     Please keep in mind the following:  DO NOT shave, including legs and underarms, starting the day of your first shower.   You may shave  your face at any point before/day of surgery.  Place clean sheets on your bed the day you start using CHG soap. Use a clean washcloth (not used since being washed) for each shower. DO NOT sleep with pets once you start using the CHG.   CHG Shower Instructions:  If you choose to wash your hair and private area, wash first with your normal shampoo/soap.  After you use shampoo/soap, rinse your hair and body thoroughly to remove shampoo/soap residue.  Turn the water OFF and apply about 3 tablespoons (45 ml) of CHG soap to a CLEAN washcloth.  Apply CHG soap ONLY FROM YOUR NECK DOWN TO YOUR TOES (washing for 3-5 minutes)  DO NOT use CHG soap on face, private areas, open wounds, or sores.  Pay special attention to the area where your surgery is  being performed.  If you are having back surgery, having someone wash your back for you may be helpful. Wait 2 minutes after CHG soap is applied, then you may rinse off the CHG soap.  Pat dry with a clean towel  Put on clean clothes/pajamas   If you choose to wear lotion, please use ONLY the CHG-compatible lotions on the back of this paper.     Additional instructions for the day of surgery: DO NOT APPLY any lotions, deodorants, cologne, or perfumes.   Put on clean/comfortable clothes.  Brush your teeth.  Ask your nurse before applying any prescription medications to the skin.      CHG Compatible Lotions   Aveeno Moisturizing lotion  Cetaphil Moisturizing Cream  Cetaphil Moisturizing Lotion  Clairol Herbal Essence Moisturizing Lotion, Dry Skin  Clairol Herbal Essence Moisturizing Lotion, Extra Dry Skin  Clairol Herbal Essence Moisturizing Lotion, Normal Skin  Curel Age Defying Therapeutic Moisturizing Lotion with Alpha Hydroxy  Curel Extreme Care Body Lotion  Curel Soothing Hands Moisturizing Hand Lotion  Curel Therapeutic Moisturizing Cream, Fragrance-Free  Curel Therapeutic Moisturizing Lotion, Fragrance-Free  Curel Therapeutic Moisturizing Lotion, Original Formula  Eucerin Daily Replenishing Lotion  Eucerin Dry Skin Therapy Plus Alpha Hydroxy Crme  Eucerin Dry Skin Therapy Plus Alpha Hydroxy Lotion  Eucerin Original Crme  Eucerin Original Lotion  Eucerin Plus Crme Eucerin Plus Lotion  Eucerin TriLipid Replenishing Lotion  Keri Anti-Bacterial Hand Lotion  Keri Deep Conditioning Original Lotion Dry Skin Formula Softly Scented  Keri Deep Conditioning Original Lotion, Fragrance Free Sensitive Skin Formula  Keri Lotion Fast Absorbing Fragrance Free Sensitive Skin Formula  Keri Lotion Fast Absorbing Softly Scented Dry Skin Formula  Keri Original Lotion  Keri Skin Renewal Lotion Keri Silky Smooth Lotion  Keri Silky Smooth Sensitive Skin Lotion  Nivea Body Creamy  Conditioning Oil  Nivea Body Extra Enriched Lotion  Nivea Body Original Lotion  Nivea Body Sheer Moisturizing Lotion Nivea Crme  Nivea Skin Firming Lotion  NutraDerm 30 Skin Lotion  NutraDerm Skin Lotion  NutraDerm Therapeutic Skin Cream  NutraDerm Therapeutic Skin Lotion  ProShield Protective Hand Cream  Provon moisturizing lotion    Please read over the following fact sheets that you were given.    If you received a COVID test during your pre-op visit  it is requested that you wear a mask when out in public, stay away from anyone that may not be feeling well and notify your surgeon if you develop symptoms. If you have been in contact with anyone that has tested positive in the last 10 days please notify you surgeon.

## 2023-05-28 ENCOUNTER — Other Ambulatory Visit: Payer: Self-pay

## 2023-05-28 ENCOUNTER — Encounter (HOSPITAL_COMMUNITY)
Admission: RE | Admit: 2023-05-28 | Discharge: 2023-05-28 | Disposition: A | Discharge status: Home / Self Care | Payer: PPO | Source: Ambulatory Visit | Attending: Neurosurgery | Admitting: Neurosurgery

## 2023-05-28 ENCOUNTER — Encounter (HOSPITAL_COMMUNITY): Payer: Self-pay

## 2023-05-28 VITALS — BP 119/78 | HR 70 | Temp 98.3°F | Resp 17 | Ht 71.0 in | Wt 191.8 lb

## 2023-05-28 DIAGNOSIS — M47819 Spondylosis without myelopathy or radiculopathy, site unspecified: Secondary | ICD-10-CM | POA: Diagnosis not present

## 2023-05-28 DIAGNOSIS — I129 Hypertensive chronic kidney disease with stage 1 through stage 4 chronic kidney disease, or unspecified chronic kidney disease: Secondary | ICD-10-CM | POA: Diagnosis not present

## 2023-05-28 DIAGNOSIS — Z8719 Personal history of other diseases of the digestive system: Secondary | ICD-10-CM | POA: Insufficient documentation

## 2023-05-28 DIAGNOSIS — N400 Enlarged prostate without lower urinary tract symptoms: Secondary | ICD-10-CM | POA: Insufficient documentation

## 2023-05-28 DIAGNOSIS — H9190 Unspecified hearing loss, unspecified ear: Secondary | ICD-10-CM | POA: Insufficient documentation

## 2023-05-28 DIAGNOSIS — M4316 Spondylolisthesis, lumbar region: Secondary | ICD-10-CM | POA: Insufficient documentation

## 2023-05-28 DIAGNOSIS — E7800 Pure hypercholesterolemia, unspecified: Secondary | ICD-10-CM | POA: Diagnosis not present

## 2023-05-28 DIAGNOSIS — Z01818 Encounter for other preprocedural examination: Secondary | ICD-10-CM | POA: Insufficient documentation

## 2023-05-28 DIAGNOSIS — K219 Gastro-esophageal reflux disease without esophagitis: Secondary | ICD-10-CM | POA: Diagnosis not present

## 2023-05-28 DIAGNOSIS — R7303 Prediabetes: Secondary | ICD-10-CM | POA: Insufficient documentation

## 2023-05-28 DIAGNOSIS — I444 Left anterior fascicular block: Secondary | ICD-10-CM | POA: Insufficient documentation

## 2023-05-28 DIAGNOSIS — M48061 Spinal stenosis, lumbar region without neurogenic claudication: Secondary | ICD-10-CM | POA: Diagnosis not present

## 2023-05-28 HISTORY — DX: Prediabetes: R73.03

## 2023-05-28 HISTORY — DX: Chronic kidney disease, unspecified: N18.9

## 2023-05-28 LAB — BASIC METABOLIC PANEL
Anion gap: 11 (ref 5–15)
BUN: 25 mg/dL — ABNORMAL HIGH (ref 8–23)
CO2: 24 mmol/L (ref 22–32)
Calcium: 9.7 mg/dL (ref 8.9–10.3)
Chloride: 104 mmol/L (ref 98–111)
Creatinine, Ser: 1.25 mg/dL — ABNORMAL HIGH (ref 0.61–1.24)
GFR, Estimated: >60 mL/min (ref >60)
Glucose, Bld: 141 mg/dL — ABNORMAL HIGH (ref 70–99)
Potassium: 3.4 mmol/L — ABNORMAL LOW (ref 3.5–5.1)
Sodium: 139 mmol/L (ref 135–145)

## 2023-05-28 LAB — HEMOGLOBIN A1C
Hgb A1c MFr Bld: 6 % — ABNORMAL HIGH (ref 4.8–5.6)
Mean Plasma Glucose: 126 mg/dL

## 2023-05-28 LAB — CBC
HCT: 43.2 % (ref 39.0–52.0)
Hemoglobin: 14.2 g/dL (ref 13.0–17.0)
MCH: 29.6 pg (ref 26.0–34.0)
MCHC: 32.9 g/dL (ref 30.0–36.0)
MCV: 90 fL (ref 80.0–100.0)
Platelets: 315 10*3/uL (ref 150–400)
RBC: 4.8 MIL/uL (ref 4.22–5.81)
RDW: 13.6 % (ref 11.5–15.5)
WBC: 6.5 10*3/uL (ref 4.0–10.5)
nRBC: 0 % (ref 0.0–0.2)

## 2023-05-28 LAB — SURGICAL PCR SCREEN
MRSA, PCR: NEGATIVE
Staphylococcus aureus: POSITIVE — AB

## 2023-05-28 LAB — TYPE AND SCREEN
ABO/RH(D): O POS
Antibody Screen: NEGATIVE

## 2023-05-28 LAB — GLUCOSE, CAPILLARY: Glucose-Capillary: 167 mg/dL — ABNORMAL HIGH (ref 70–99)

## 2023-05-28 NOTE — Progress Notes (Signed)
PCP - Carylon Perches, MD Cardiologist - denies  PPM/ICD - denies Device Orders - n/a Rep Notified - n/a  Chest x-ray - n/a EKG - 05/28/23 Stress Test - denies ECHO - denies Cardiac Cath - denies  Sleep Study - denies CPAP - n/a  Fasting Blood Sugar - 90 - 114 Checks Blood Sugar once a day CBG today - 167 A1C - 05/28/23  Blood Thinner Instructions: n/a Aspirin Instructions: patient was instructed to call MD and ask if/when he needs to stop Aspirin Patient was instructed: As of today, STOP taking any Aleve, Naproxen, Ibuprofen, Motrin, Advil, Goody's, BC's, all herbal medications, fish oil, and all vitamins.   ERAS Protcol - n/a   COVID TEST- n/a   Anesthesia review: yes  Patient denies shortness of breath, fever, cough and chest pain at PAT appointment   All instructions explained to the patient, with a verbal understanding of the material. Patient agrees to go over the instructions while at home for a better understanding. Patient also instructed to self quarantine after being tested for COVID-19. The opportunity to ask questions was provided.

## 2023-05-28 NOTE — Pre-Procedure Instructions (Signed)
Surgical Instructions    Your procedure is scheduled on Thursday, July 11th, 2024.    Report to [facility] Main Entrance "A" at 0800 A.M., then check in with the Admitting office.  Call this number if you have problems the morning of surgery:  [phone]  If you have any questions prior to your surgery date call [phone]: Open Monday-Friday 8am-4pm If you experience any cold or flu symptoms such as cough, fever, chills, shortness of breath, etc. between now and your scheduled surgery, please notify us at the above number.     Remember:  Do not eat after midnight the night before your surgery    Take these medicines the morning of surgery with A SIP OF WATER:             amLODipine (NORVASC)              omeprazole (PRILOSEC)    Follow your surgeon's instructions on when to stop Aspirin.  If no instructions were given by your surgeon then you will need to call the office to get those instructions.    As of today, STOP taking any Aleve, Naproxen, Ibuprofen, Motrin, Advil, Goody's, BC's, all herbal medications, fish oil, and all vitamins.   Do not take pioglitazone (ACTOS) the morning of surgery.  THE NIGHT BEFORE SURGERY, take ___________ units of ___________insulin.      HOW TO MANAGE YOUR DIABETES BEFORE AND AFTER SURGERY  Why is it important to control my blood sugar before and after surgery? Improving blood sugar levels before and after surgery helps healing and can limit problems. A way of improving blood sugar control is eating a healthy diet by:  Eating less sugar and carbohydrates  Increasing activity/exercise  Talking with your doctor about reaching your blood sugar goals High blood sugars (greater than 180 mg/dL) can raise your risk of infections and slow your recovery, so you will need to focus on controlling your diabetes during the weeks before surgery. Make sure that the doctor who takes care of your diabetes knows about your planned surgery including the  date and location.  How do I manage my blood sugar before surgery? Check your blood sugar at least 4 times a day, starting 2 days before surgery, to make sure that the level is not too high or low.  Check your blood sugar the morning of your surgery when you wake up and every 2 hours until you get to the Short Stay unit.  If your blood sugar is less than 70 mg/dL, you will need to treat for low blood sugar: Do not take insulin. Treat a low blood sugar (less than 70 mg/dL) with  cup of clear juice (cranberry or apple), 4 glucose tablets, OR glucose gel. Recheck blood sugar in 15 minutes after treatment (to make sure it is greater than 70 mg/dL). If your blood sugar is not greater than 70 mg/dL on recheck, call [phone] for further instructions. Report your blood sugar to the short stay nurse when you get to Short Stay.  If you are admitted to the hospital after surgery: Your blood sugar will be checked by the staff and you will probably be given insulin after surgery (instead of oral diabetes medicines) to make sure you have good blood sugar levels. The goal for blood sugar control after surgery is 80-180 mg/dL.                      Do NOT Smoke (Tobacco/Vaping) for 24  hours prior to your procedure.  If you use a CPAP at night, you may bring your mask/headgear for your overnight stay.   Contacts, glasses, piercing's, hearing aid's, dentures or partials may not be worn into surgery, please bring cases for these belongings.    For patients admitted to the hospital, discharge time will be determined by your treatment team.   Patients discharged the day of surgery will not be allowed to drive home, and someone needs to stay with them for 24 hours.  SURGICAL WAITING ROOM VISITATION Patients having surgery or a procedure may have no more than 2 support people in the waiting area - these visitors may rotate.   Children under the age of 27 must have an adult with them who is not the  patient. If the patient needs to stay at the hospital during part of their recovery, the visitor guidelines for inpatient rooms apply. Pre-op nurse will coordinate an appropriate time for 1 support person to accompany patient in pre-op.  This support person may not rotate.   Please refer to the [facility] website for the visitor guidelines for Inpatients (after your surgery is over and you are in a regular room).    Special instructions:       Pre-operative 5 CHG Bath Instructions   You can play a key role in reducing the risk of infection after surgery. Your skin needs to be as free of germs as possible. You can reduce the number of germs on your skin by washing with CHG (chlorhexidine gluconate) soap before surgery. CHG is an antiseptic soap that kills germs and continues to kill germs even after washing.   DO NOT use if you have an allergy to chlorhexidine/CHG or antibacterial soaps. If your skin becomes reddened or irritated, stop using the CHG and notify one of our RNs at [phone].   Please shower with the CHG soap starting 4 days before surgery using the following schedule:     Please keep in mind the following:  DO NOT shave, including legs and underarms, starting the day of your first shower.   You may shave your face at any point before/day of surgery.  Place clean sheets on your bed the day you start using CHG soap. Use a clean washcloth (not used since being washed) for each shower. DO NOT sleep with pets once you start using the CHG.   CHG Shower Instructions:  If you choose to wash your hair and private area, wash first with your normal shampoo/soap.  After you use shampoo/soap, rinse your hair and body thoroughly to remove shampoo/soap residue.  Turn the water OFF and apply about 3 tablespoons (45 ml) of CHG soap to a CLEAN washcloth.  Apply CHG soap ONLY FROM YOUR NECK DOWN TO YOUR TOES (washing for 3-5 minutes)  DO NOT use CHG soap on face, private areas, open  wounds, or sores.  Pay special attention to the area where your surgery is being performed.  If you are having back surgery, having someone wash your back for you may be helpful. Wait 2 minutes after CHG soap is applied, then you may rinse off the CHG soap.  Pat dry with a clean towel  Put on clean clothes/pajamas   If you choose to wear lotion, please use ONLY the CHG-compatible lotions on the back of this paper.     Additional instructions for the day of surgery: DO NOT APPLY any lotions, deodorants, cologne, or perfumes.   Put on clean/comfortable clothes.  Brush your teeth.  Ask your nurse before applying any prescription medications to the skin.      CHG Compatible Lotions   Aveeno Moisturizing lotion  Cetaphil Moisturizing Cream  Cetaphil Moisturizing Lotion  Clairol Herbal Essence Moisturizing Lotion, Dry Skin  Clairol Herbal Essence Moisturizing Lotion, Extra Dry Skin  Clairol Herbal Essence Moisturizing Lotion, Normal Skin  Curel Age Defying Therapeutic Moisturizing Lotion with Alpha Hydroxy  Curel Extreme Care Body Lotion  Curel Soothing Hands Moisturizing Hand Lotion  Curel Therapeutic Moisturizing Cream, Fragrance-Free  Curel Therapeutic Moisturizing Lotion, Fragrance-Free  Curel Therapeutic Moisturizing Lotion, Original Formula  Eucerin Daily Replenishing Lotion  Eucerin Dry Skin Therapy Plus Alpha Hydroxy Crme  Eucerin Dry Skin Therapy Plus Alpha Hydroxy Lotion  Eucerin Original Crme  Eucerin Original Lotion  Eucerin Plus Crme Eucerin Plus Lotion  Eucerin TriLipid Replenishing Lotion  Keri Anti-Bacterial Hand Lotion  Keri Deep Conditioning Original Lotion Dry Skin Formula Softly Scented  Keri Deep Conditioning Original Lotion, Fragrance Free Sensitive Skin Formula  Keri Lotion Fast Absorbing Fragrance Free Sensitive Skin Formula  Keri Lotion Fast Absorbing Softly Scented Dry Skin Formula  Keri Original Lotion  Keri Skin Renewal Lotion Keri Silky Smooth  Lotion  Keri Silky Smooth Sensitive Skin Lotion  Nivea Body Creamy Conditioning Oil  Nivea Body Extra Enriched Lotion  Nivea Body Original Lotion  Nivea Body Sheer Moisturizing Lotion Nivea Crme  Nivea Skin Firming Lotion  NutraDerm 30 Skin Lotion  NutraDerm Skin Lotion  NutraDerm Therapeutic Skin Cream  NutraDerm Therapeutic Skin Lotion  ProShield Protective Hand Cream  Provon moisturizing lotion    Please read over the following fact sheets that you were given.    If you received a COVID test during your pre-op visit  it is requested that you wear a mask when out in public, stay away from anyone that may not be feeling well and notify your surgeon if you develop symptoms. If you have been in contact with anyone that has tested positive in the last 10 days please notify you surgeon.

## 2023-05-29 NOTE — Progress Notes (Signed)
Anesthesia Chart Review:   Case: [id number] Date/Time: 06/11/23 0948   Procedure: PLIF,IP,POSTERIOR INSTRUMENTATION L45 - 3C   Anesthesia type: General   Pre-op diagnosis: SPONDYLOLISTHESIS, LUMBAR REGION   Location: [facility] ROOM [facility] / [facility] OR   Surgeons: Tressie Stalker, MD       DISCUSSION: Patient is a 72 year old male scheduled for the above procedure.  History includes never smoker, HTN, hypercholesterolemia, pre-diabetes, CKD, GERD, Barrett's esophagus, chronic bilateral mastoiditis (s/p bilateral mastoidectomies 1993; right radical mastoidectomy 10/17/93; left modified radical mastoidectomy 10/15/94), hard of hearing, BPH, right IHR (10/15/20).   He was suppose to get an EKG during his PAT visit, but it appears he left after getting his labs done so will get an EKG on the day of surgery. He denied chest pain and SOB at his PAT RN visit.   A1c 6.0%. CBC normal. Creatinine 1.25.  Anesthesia team to evaluate on the day of surgery.    VS: BP 119/78   Pulse 70   Temp 36.8 C   Resp 17   Ht 5\' 11"  (1.803 m)   Wt 87 kg   SpO2 96%   BMI 26.75 kg/m   PROVIDERS: Carylon Perches, MD is PCP  Ermalinda Barrios, MD is ENT ([facility]). Last visit 05/01/23 with "Stable bilateral MRM cavities without evidence of infection or cholesteatoma. Both cavities were cleaned as above. Patient is wearing a BICROS system. He is not sure that he likes the right hearing aid. 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   LABS: Labs reviewed: Acceptable for surgery. (all labs ordered are listed, but only abnormal results are displayed)  Labs Reviewed  SURGICAL PCR SCREEN - Abnormal; Notable for the following components:      Result Value    Staphylococcus aureus POSITIVE (*)    All other components within normal limits  GLUCOSE, CAPILLARY - Abnormal; Notable for the following components:   Glucose-Capillary 167 (*)    All other components within normal limits  BASIC METABOLIC PANEL - Abnormal; Notable for the following components:   Potassium 3.4 (*)    Glucose, Bld 141 (*)    BUN 25 (*)    Creatinine, Ser 1.25 (*)    All other components within normal limits  HEMOGLOBIN A1C - Abnormal; Notable for the following components:   Hgb A1c MFr Bld 6.0 (*)    All other components within normal limits  CBC  TYPE AND SCREEN    IMAGES: MRI L-spine 08/06/22: IMPRESSION: 1. Severe spinal canal stenosis and right neural foraminal stenosis at L4-L5 due to combination of anterolisthesis and severe facet arthrosis. 2. Mild spinal canal stenosis at L2-3 and L3-4. 3. Right lateral recess stenosis at L2-3 and L3-4 could affect the right L3 and/or L4 nerve roots.    EKG: See DISCUSSION. For day of surgery.   EKG 10/15/20: Sinus rhythm with 1st degree A-V block Left anterior fascicular block Poor R wave progression Abnormal ECG Confirmed by Orpah Cobb ([id number]) on 10/15/2020 2:24:12 PM   CV: N/A  Past Medical History:  Diagnosis Date   Arthritis    Barrett's esophagus    BPH (benign prostatic hyperplasia)    Chronic kidney disease  Fracture of 5th metatarsal    GERD (gastroesophageal reflux disease)    Hypercholesteremia    Hypertension    Mastoiditis    Pre-diabetes    Ulcer disease    Bulbar    Past Surgical History:  Procedure Laterality Date   BIOPSY  12/12/2021   Procedure: BIOPSY;  Surgeon: Malissa Hippo, MD;  [facility]: [facility];  Service: Endoscopy;;   COLONOSCOPY     COLONOSCOPY     COLONOSCOPY N/A 08/27/2016   Procedure: COLONOSCOPY;  Surgeon: Malissa Hippo, MD;  [facility]: [facility];  Service: Endoscopy;  Laterality: N/A;  930 - moved to 9/27 @ 12:00 - Ann notified pt   COLONOSCOPY N/A  12/12/2021   Procedure: COLONOSCOPY;  Surgeon: Malissa Hippo, MD;  [facility]: [facility];  Service: Endoscopy;  Laterality: N/A;  730   HEMORRHOID SURGERY  2009   INGUINAL HERNIA REPAIR Right 10/15/2020   Procedure: OPEN RIGHT INGUINAL HERNIA REPAIR WITH MESH;  Surgeon: Ovidio Kin, MD;  Location: [facility] OR;  Service: General;  Laterality: Right;   INSERTION OF MESH Right 10/15/2020   Procedure: INSERTION OF MESH;  Surgeon: Ovidio Kin, MD;  Location: [facility] OR;  Service: General;  Laterality: Right;   KNEE ARTHROSCOPY     Right Knee   ORIF TOE FRACTURE Left 12/09/2013   Procedure: LEFT OPEN REDUCTION INTERNAL FIXATION (ORIF) FIFTH METATARSAL FRACTURE;  Surgeon: Nilda Simmer, MD;  Location: [facility];  Service: Orthopedics;  Laterality: Left;   POLYPECTOMY  08/27/2016   Procedure: POLYPECTOMY;  Surgeon: Malissa Hippo, MD;  [facility];  Service: Endoscopy;;  colon   POLYPECTOMY  12/12/2021   Procedure: POLYPECTOMY;  Surgeon: Malissa Hippo, MD;  [facility];  Service: Endoscopy;;   rotor cuff  2009   right-   TONSILLECTOMY     UPPER GASTROINTESTINAL ENDOSCOPY      MEDICATIONS:  amLODipine (NORVASC) 5 MG tablet   aspirin EC 81 MG tablet   chlorthalidone (HYGROTON) 25 MG tablet   ezetimibe (ZETIA) 10 MG tablet   Ginger, Zingiber officinalis, (GINGER PO)   Glucosamine HCl 1000 MG TABS   losartan (COZAAR) 100 MG tablet   Misc Natural Products (TART CHERRY ADVANCED PO)   Multiple Vitamin (MULTIVITAMIN WITH MINERALS) TABS tablet   Omega-3 Fatty Acids (FISH OIL) 1000 MG CAPS   omeprazole (PRILOSEC) 20 MG capsule   pioglitazone (ACTOS) 15 MG tablet   TURMERIC PO   No current facility-administered medications for this encounter.  Advised to follow surgeon instructions regarding perioperative ASA.   Shonna Chock, PA-C Surgical Short Stay/Anesthesiology [facility] Phone [phone] [facility] Phone [phone] 05/29/2023 4:34  PM

## 2023-05-29 NOTE — Anesthesia Preprocedure Evaluation (Addendum)
Anesthesia Evaluation  Patient identified by MRN, date of birth, ID band Patient awake    Reviewed: Allergy & Precautions, H&P , NPO status , Patient's Chart, lab work & pertinent test results  Airway Mallampati: II   Neck ROM: full    Dental   Pulmonary neg pulmonary ROS   breath sounds clear to auscultation       Cardiovascular hypertension,  Rhythm:regular Rate:Normal     Neuro/Psych    GI/Hepatic ,GERD  ,,  Endo/Other    Renal/GU Renal InsufficiencyRenal disease     Musculoskeletal  (+) Arthritis ,    Abdominal   Peds  Hematology   Anesthesia Other Findings   Reproductive/Obstetrics                             Anesthesia Physical Anesthesia Plan  ASA: 3  Anesthesia Plan: General   Post-op Pain Management:    Induction: Intravenous  PONV Risk Score and Plan: 2 and Ondansetron, Dexamethasone and Treatment may vary due to age or medical condition  Airway Management Planned: Oral ETT  Additional Equipment:   Intra-op Plan:   Post-operative Plan: Extubation in OR  Informed Consent: I have reviewed the patients History and Physical, chart, labs and discussed the procedure including the risks, benefits and alternatives for the proposed anesthesia with the patient or authorized representative who has indicated his/her understanding and acceptance.       Plan Discussed with: CRNA, Anesthesiologist and Surgeon  Anesthesia Plan Comments: (PAT note written 05/29/2023 by Shonna Chock, PA-C.  )       Anesthesia Quick Evaluation

## 2023-06-10 NOTE — Progress Notes (Addendum)
Spoke with Bonita Quin in Blood Bank regarding 05/28/23 T & S for DOS on 06/11/23.  Redraw T & S on DOS using the same Blood Bank Band per Bonita Quin in Blood Bank.  Verified the above information. Surgery was moved from 06/10/23 to 06/11/23.  Surgical instructions are the same for new DOS.  Patient to arrive at 8 AM, NPO after midnight tonight per MD's orders.  Patient's wife Frank [name] verified understanding of the above information.

## 2023-06-11 ENCOUNTER — Ambulatory Visit (HOSPITAL_COMMUNITY): Payer: PPO

## 2023-06-11 ENCOUNTER — Ambulatory Visit (HOSPITAL_COMMUNITY)
Admission: RE | Admit: 2023-06-11 | Discharge: 2023-06-12 | Disposition: A | Discharge status: Home / Self Care | Payer: PPO | Attending: Neurosurgery | Admitting: Neurosurgery

## 2023-06-11 ENCOUNTER — Other Ambulatory Visit: Payer: Self-pay

## 2023-06-11 ENCOUNTER — Ambulatory Visit (HOSPITAL_COMMUNITY)
Admission: RE | Disposition: A | Discharge status: Home / Self Care | Payer: Self-pay | Source: Home / Self Care | Attending: Neurosurgery

## 2023-06-11 ENCOUNTER — Ambulatory Visit (HOSPITAL_COMMUNITY): Payer: PPO | Admitting: Vascular Surgery

## 2023-06-11 ENCOUNTER — Ambulatory Visit (HOSPITAL_BASED_OUTPATIENT_CLINIC_OR_DEPARTMENT_OTHER): Payer: PPO

## 2023-06-11 ENCOUNTER — Encounter (HOSPITAL_COMMUNITY): Payer: Self-pay | Admitting: Neurosurgery

## 2023-06-11 DIAGNOSIS — I129 Hypertensive chronic kidney disease with stage 1 through stage 4 chronic kidney disease, or unspecified chronic kidney disease: Secondary | ICD-10-CM | POA: Insufficient documentation

## 2023-06-11 DIAGNOSIS — N189 Chronic kidney disease, unspecified: Secondary | ICD-10-CM | POA: Insufficient documentation

## 2023-06-11 DIAGNOSIS — M4316 Spondylolisthesis, lumbar region: Secondary | ICD-10-CM | POA: Diagnosis not present

## 2023-06-11 DIAGNOSIS — Z419 Encounter for procedure for purposes other than remedying health state, unspecified: Secondary | ICD-10-CM | POA: Diagnosis not present

## 2023-06-11 DIAGNOSIS — M1990 Unspecified osteoarthritis, unspecified site: Secondary | ICD-10-CM | POA: Diagnosis not present

## 2023-06-11 DIAGNOSIS — M48062 Spinal stenosis, lumbar region with neurogenic claudication: Secondary | ICD-10-CM | POA: Insufficient documentation

## 2023-06-11 DIAGNOSIS — K219 Gastro-esophageal reflux disease without esophagitis: Secondary | ICD-10-CM | POA: Diagnosis not present

## 2023-06-11 DIAGNOSIS — N400 Enlarged prostate without lower urinary tract symptoms: Secondary | ICD-10-CM

## 2023-06-11 DIAGNOSIS — M4726 Other spondylosis with radiculopathy, lumbar region: Secondary | ICD-10-CM | POA: Diagnosis not present

## 2023-06-11 DIAGNOSIS — Z7984 Long term (current) use of oral hypoglycemic drugs: Secondary | ICD-10-CM | POA: Insufficient documentation

## 2023-06-11 DIAGNOSIS — I10 Essential (primary) hypertension: Secondary | ICD-10-CM | POA: Diagnosis not present

## 2023-06-11 DIAGNOSIS — M5116 Intervertebral disc disorders with radiculopathy, lumbar region: Secondary | ICD-10-CM | POA: Insufficient documentation

## 2023-06-11 DIAGNOSIS — Z981 Arthrodesis status: Secondary | ICD-10-CM | POA: Diagnosis not present

## 2023-06-11 LAB — TYPE AND SCREEN
ABO/RH(D): O POS
Antibody Screen: NEGATIVE

## 2023-06-11 LAB — GLUCOSE, CAPILLARY: Glucose-Capillary: 125 mg/dL — ABNORMAL HIGH (ref 70–99)

## 2023-06-11 SURGERY — POSTERIOR LUMBAR FUSION 1 LEVEL
Anesthesia: General | Site: Spine Lumbar

## 2023-06-11 MED ORDER — DEXAMETHASONE SODIUM PHOSPHATE 10 MG/ML IJ SOLN
INTRAMUSCULAR | Status: DC | PRN
  Administered 2023-06-11: 5 mg via INTRAVENOUS

## 2023-06-11 MED ORDER — OXYCODONE HCL 5 MG PO TABS
10.0000 mg | ORAL_TABLET | ORAL | Status: DC | PRN
  Administered 2023-06-12: 10 mg via ORAL
  Filled 2023-06-11: qty 2

## 2023-06-11 MED ORDER — ROCURONIUM BROMIDE 10 MG/ML (PF) SYRINGE
PREFILLED_SYRINGE | INTRAVENOUS | Status: DC | PRN
  Administered 2023-06-11: 10 mg via INTRAVENOUS
  Administered 2023-06-11: 50 mg via INTRAVENOUS
  Administered 2023-06-11: 20 mg via INTRAVENOUS
  Administered 2023-06-11: 30 mg via INTRAVENOUS
  Administered 2023-06-11: 20 mg via INTRAVENOUS

## 2023-06-11 MED ORDER — PANTOPRAZOLE SODIUM 40 MG PO TBEC
40.0000 mg | DELAYED_RELEASE_TABLET | Freq: Every day | ORAL | Status: DC
  Administered 2023-06-12: 40 mg via ORAL
  Filled 2023-06-11: qty 1

## 2023-06-11 MED ORDER — MORPHINE SULFATE (PF) 4 MG/ML IV SOLN: 4.0000 mg | INTRAVENOUS | Status: DC | PRN

## 2023-06-11 MED ORDER — BUPIVACAINE-EPINEPHRINE (PF) 0.25% -1:200000 IJ SOLN
INTRAMUSCULAR | Status: AC
  Filled 2023-06-11: qty 30

## 2023-06-11 MED ORDER — BUPIVACAINE LIPOSOME 1.3 % IJ SUSP
INTRAMUSCULAR | Status: AC
  Filled 2023-06-11: qty 20

## 2023-06-11 MED ORDER — EZETIMIBE 10 MG PO TABS
10.0000 mg | ORAL_TABLET | Freq: Every day | ORAL | Status: DC
  Administered 2023-06-11: 10 mg via ORAL
  Filled 2023-06-11: qty 1

## 2023-06-11 MED ORDER — SURGIPHOR WOUND IRRIGATION SYSTEM - OPTIME
TOPICAL | Status: DC | PRN
  Administered 2023-06-11: 450 mL

## 2023-06-11 MED ORDER — PHENYLEPHRINE HCL-NACL 20-0.9 MG/250ML-% IV SOLN
INTRAVENOUS | Status: DC | PRN
  Administered 2023-06-11: 30 ug/min via INTRAVENOUS

## 2023-06-11 MED ORDER — BSS IO SOLN
INTRAOCULAR | Status: AC
  Filled 2023-06-11: qty 15

## 2023-06-11 MED ORDER — CEFAZOLIN SODIUM-DEXTROSE 2-4 GM/100ML-% IV SOLN
2.0000 g | Freq: Three times a day (TID) | INTRAVENOUS | Status: AC
  Administered 2023-06-11 – 2023-06-12 (×2): 2 g via INTRAVENOUS
  Filled 2023-06-11 (×2): qty 100

## 2023-06-11 MED ORDER — ONDANSETRON HCL 4 MG PO TABS: 4.0000 mg | ORAL_TABLET | Freq: Four times a day (QID) | ORAL | Status: DC | PRN

## 2023-06-11 MED ORDER — POLYMYXIN B-TRIMETHOPRIM 10000-0.1 UNIT/ML-% OP SOLN: 1.0000 [drp] | Freq: Four times a day (QID) | OPHTHALMIC | Status: DC

## 2023-06-11 MED ORDER — ROCURONIUM BROMIDE 10 MG/ML (PF) SYRINGE
PREFILLED_SYRINGE | INTRAVENOUS | Status: AC
  Filled 2023-06-11: qty 10

## 2023-06-11 MED ORDER — THROMBIN 5000 UNITS EX SOLR
OROMUCOSAL | Status: DC | PRN
  Administered 2023-06-11 (×2): 5 mL via TOPICAL

## 2023-06-11 MED ORDER — ONDANSETRON HCL 4 MG/2ML IJ SOLN
INTRAMUSCULAR | Status: AC
  Filled 2023-06-11: qty 2

## 2023-06-11 MED ORDER — BACITRACIN ZINC 500 UNIT/GM EX OINT
TOPICAL_OINTMENT | CUTANEOUS | Status: DC | PRN
  Administered 2023-06-11: 1 via TOPICAL

## 2023-06-11 MED ORDER — LOSARTAN POTASSIUM 50 MG PO TABS
100.0000 mg | ORAL_TABLET | Freq: Every day | ORAL | Status: DC
  Administered 2023-06-11: 100 mg via ORAL
  Filled 2023-06-11 (×2): qty 2

## 2023-06-11 MED ORDER — CEFAZOLIN SODIUM-DEXTROSE 2-4 GM/100ML-% IV SOLN
2.0000 g | INTRAVENOUS | Status: AC
  Administered 2023-06-11: 2 g via INTRAVENOUS
  Filled 2023-06-11: qty 100

## 2023-06-11 MED ORDER — ONDANSETRON HCL 4 MG/2ML IJ SOLN
INTRAMUSCULAR | Status: DC | PRN
  Administered 2023-06-11: 4 mg via INTRAVENOUS

## 2023-06-11 MED ORDER — BISACODYL 10 MG RE SUPP: 10.0000 mg | Freq: Every day | RECTAL | Status: DC | PRN

## 2023-06-11 MED ORDER — PHENYLEPHRINE 80 MCG/ML (10ML) SYRINGE FOR IV PUSH (FOR BLOOD PRESSURE SUPPORT)
PREFILLED_SYRINGE | INTRAVENOUS | Status: AC
  Filled 2023-06-11: qty 10

## 2023-06-11 MED ORDER — PHENOL 1.4 % MT LIQD: 1.0000 | OROMUCOSAL | Status: DC | PRN

## 2023-06-11 MED ORDER — PHENYLEPHRINE 80 MCG/ML (10ML) SYRINGE FOR IV PUSH (FOR BLOOD PRESSURE SUPPORT)
PREFILLED_SYRINGE | INTRAVENOUS | Status: DC | PRN
  Administered 2023-06-11 (×3): 80 ug via INTRAVENOUS

## 2023-06-11 MED ORDER — CHLORHEXIDINE GLUCONATE 0.12 % MT SOLN
15.0000 mL | Freq: Once | OROMUCOSAL | Status: AC
  Administered 2023-06-11: 15 mL via OROMUCOSAL
  Filled 2023-06-11: qty 15

## 2023-06-11 MED ORDER — SODIUM CHLORIDE 0.9% FLUSH
3.0000 mL | Freq: Two times a day (BID) | INTRAVENOUS | Status: DC
  Administered 2023-06-11: 3 mL via INTRAVENOUS

## 2023-06-11 MED ORDER — BSS IO SOLN: 15.0000 mL | Freq: Once | INTRAOCULAR | Status: DC

## 2023-06-11 MED ORDER — LIDOCAINE 2% (20 MG/ML) 5 ML SYRINGE
INTRAMUSCULAR | Status: DC | PRN
  Administered 2023-06-11: 60 mg via INTRAVENOUS

## 2023-06-11 MED ORDER — FENTANYL CITRATE (PF) 100 MCG/2ML IJ SOLN
INTRAMUSCULAR | Status: AC
  Filled 2023-06-11: qty 2

## 2023-06-11 MED ORDER — BUPIVACAINE LIPOSOME 1.3 % IJ SUSP
INTRAMUSCULAR | Status: DC | PRN
  Administered 2023-06-11: 20 mL

## 2023-06-11 MED ORDER — BSS IO SOLN
15.0000 mL | Freq: Once | INTRAOCULAR | Status: AC
  Administered 2023-06-11: 15 mL

## 2023-06-11 MED ORDER — ONDANSETRON HCL 4 MG/2ML IJ SOLN: 4.0000 mg | Freq: Four times a day (QID) | INTRAMUSCULAR | Status: DC | PRN

## 2023-06-11 MED ORDER — SODIUM CHLORIDE 0.9% FLUSH: 3.0000 mL | INTRAVENOUS | Status: DC | PRN

## 2023-06-11 MED ORDER — FENTANYL CITRATE (PF) 250 MCG/5ML IJ SOLN
INTRAMUSCULAR | Status: DC | PRN
  Administered 2023-06-11: 100 ug via INTRAVENOUS
  Administered 2023-06-11 (×3): 50 ug via INTRAVENOUS

## 2023-06-11 MED ORDER — FENTANYL CITRATE (PF) 250 MCG/5ML IJ SOLN
INTRAMUSCULAR | Status: AC
  Filled 2023-06-11: qty 5

## 2023-06-11 MED ORDER — DOCUSATE SODIUM 100 MG PO CAPS
100.0000 mg | ORAL_CAPSULE | Freq: Two times a day (BID) | ORAL | Status: DC
  Administered 2023-06-11 – 2023-06-12 (×2): 100 mg via ORAL
  Filled 2023-06-11 (×2): qty 1

## 2023-06-11 MED ORDER — LIDOCAINE 2% (20 MG/ML) 5 ML SYRINGE
INTRAMUSCULAR | Status: AC
  Filled 2023-06-11: qty 5

## 2023-06-11 MED ORDER — CHLORHEXIDINE GLUCONATE CLOTH 2 % EX PADS: 6.0000 | MEDICATED_PAD | Freq: Once | CUTANEOUS | Status: DC

## 2023-06-11 MED ORDER — BACITRACIN ZINC 500 UNIT/GM EX OINT
TOPICAL_OINTMENT | CUTANEOUS | Status: AC
  Filled 2023-06-11: qty 28.35

## 2023-06-11 MED ORDER — MENTHOL 3 MG MT LOZG: 1.0000 | LOZENGE | OROMUCOSAL | Status: DC | PRN

## 2023-06-11 MED ORDER — 0.9 % SODIUM CHLORIDE (POUR BTL) OPTIME
TOPICAL | Status: DC | PRN
  Administered 2023-06-11: 1000 mL

## 2023-06-11 MED ORDER — VANCOMYCIN HCL IN DEXTROSE 1-5 GM/200ML-% IV SOLN
1000.0000 mg | INTRAVENOUS | Status: AC
  Administered 2023-06-11: 1000 mg via INTRAVENOUS
  Filled 2023-06-11: qty 200

## 2023-06-11 MED ORDER — PIOGLITAZONE HCL 15 MG PO TABS
15.0000 mg | ORAL_TABLET | Freq: Every day | ORAL | Status: DC
  Administered 2023-06-11 – 2023-06-12 (×2): 15 mg via ORAL
  Filled 2023-06-11 (×3): qty 1

## 2023-06-11 MED ORDER — PROPOFOL 10 MG/ML IV BOLUS
INTRAVENOUS | Status: AC
  Filled 2023-06-11: qty 20

## 2023-06-11 MED ORDER — OXYCODONE HCL 5 MG PO TABS: 5.0000 mg | ORAL_TABLET | Freq: Once | ORAL | Status: DC | PRN

## 2023-06-11 MED ORDER — OXYCODONE HCL 5 MG PO TABS
5.0000 mg | ORAL_TABLET | ORAL | Status: DC | PRN
  Administered 2023-06-11 – 2023-06-12 (×4): 5 mg via ORAL
  Filled 2023-06-11 (×4): qty 1

## 2023-06-11 MED ORDER — EPHEDRINE SULFATE-NACL 50-0.9 MG/10ML-% IV SOSY
PREFILLED_SYRINGE | INTRAVENOUS | Status: DC | PRN
  Administered 2023-06-11: 10 mg via INTRAVENOUS

## 2023-06-11 MED ORDER — SODIUM CHLORIDE 0.9 % IV SOLN
250.0000 mL | INTRAVENOUS | Status: DC
  Administered 2023-06-11: 250 mL via INTRAVENOUS

## 2023-06-11 MED ORDER — KETOROLAC TROMETHAMINE 0.5 % OP SOLN: 1.0000 [drp] | Freq: Three times a day (TID) | OPHTHALMIC | Status: DC | PRN

## 2023-06-11 MED ORDER — SUGAMMADEX SODIUM 200 MG/2ML IV SOLN
INTRAVENOUS | Status: DC | PRN
  Administered 2023-06-11: 200 mg via INTRAVENOUS

## 2023-06-11 MED ORDER — FENTANYL CITRATE (PF) 100 MCG/2ML IJ SOLN
25.0000 ug | INTRAMUSCULAR | Status: DC | PRN
  Administered 2023-06-11 (×2): 25 ug via INTRAVENOUS

## 2023-06-11 MED ORDER — AMLODIPINE BESYLATE 5 MG PO TABS: 5.0000 mg | ORAL_TABLET | Freq: Every day | ORAL | Status: DC

## 2023-06-11 MED ORDER — OXYCODONE HCL 5 MG/5ML PO SOLN: 5.0000 mg | Freq: Once | ORAL | Status: DC | PRN

## 2023-06-11 MED ORDER — BUPIVACAINE-EPINEPHRINE (PF) 0.25% -1:200000 IJ SOLN
INTRAMUSCULAR | Status: DC | PRN
  Administered 2023-06-11: 10 mL via PERINEURAL

## 2023-06-11 MED ORDER — EPHEDRINE 5 MG/ML INJ
INTRAVENOUS | Status: AC
  Filled 2023-06-11: qty 5

## 2023-06-11 MED ORDER — KETOROLAC TROMETHAMINE 0.5 % OP SOLN: 1.0000 [drp] | Freq: Four times a day (QID) | OPHTHALMIC | Status: DC

## 2023-06-11 MED ORDER — CYCLOBENZAPRINE HCL 10 MG PO TABS
10.0000 mg | ORAL_TABLET | Freq: Three times a day (TID) | ORAL | Status: DC | PRN
  Administered 2023-06-11: 10 mg via ORAL
  Filled 2023-06-11 (×2): qty 1

## 2023-06-11 MED ORDER — THROMBIN 5000 UNITS EX SOLR
CUTANEOUS | Status: AC
  Filled 2023-06-11: qty 5000

## 2023-06-11 MED ORDER — LACTATED RINGERS IV SOLN: INTRAVENOUS | Status: DC

## 2023-06-11 MED ORDER — DEXAMETHASONE SODIUM PHOSPHATE 10 MG/ML IJ SOLN
INTRAMUSCULAR | Status: AC
  Filled 2023-06-11: qty 1

## 2023-06-11 MED ORDER — ACETAMINOPHEN 500 MG PO TABS
1000.0000 mg | ORAL_TABLET | Freq: Four times a day (QID) | ORAL | Status: DC
  Administered 2023-06-11 – 2023-06-12 (×3): 1000 mg via ORAL
  Filled 2023-06-11 (×3): qty 2

## 2023-06-11 MED ORDER — ACETAMINOPHEN 650 MG RE SUPP: 650.0000 mg | RECTAL | Status: DC | PRN

## 2023-06-11 MED ORDER — KETOROLAC TROMETHAMINE 0.5 % OP SOLN
OPHTHALMIC | Status: AC
  Administered 2023-06-11: 1 [drp] via OPHTHALMIC
  Filled 2023-06-11: qty 5

## 2023-06-11 MED ORDER — ORAL CARE MOUTH RINSE: 15.0000 mL | Freq: Once | OROMUCOSAL | Status: AC

## 2023-06-11 MED ORDER — PROPOFOL 10 MG/ML IV BOLUS
INTRAVENOUS | Status: DC | PRN
  Administered 2023-06-11: 170 mg via INTRAVENOUS

## 2023-06-11 MED ORDER — ACETAMINOPHEN 325 MG PO TABS: 650.0000 mg | ORAL_TABLET | ORAL | Status: DC | PRN

## 2023-06-11 MED ORDER — CHLORTHALIDONE 25 MG PO TABS
25.0000 mg | ORAL_TABLET | Freq: Every day | ORAL | Status: DC
  Administered 2023-06-11 – 2023-06-12 (×2): 25 mg via ORAL
  Filled 2023-06-11 (×2): qty 1

## 2023-06-11 SURGICAL SUPPLY — 69 items
APL SKNCLS STERI-STRIP NONHPOA (GAUZE/BANDAGES/DRESSINGS) ×1
BAG COUNTER SPONGE SURGICOUNT (BAG) ×1 IMPLANT
BAG SPNG CNTER NS LX DISP (BAG) ×1
BASKET BONE COLLECTION (BASKET) ×1 IMPLANT
BENZOIN TINCTURE PRP APPL 2/3 (GAUZE/BANDAGES/DRESSINGS) ×1 IMPLANT
BLADE CLIPPER SURG (BLADE) IMPLANT
BUR MATCHSTICK NEURO 3.0 LAGG (BURR) ×1 IMPLANT
BUR PRECISION FLUTE 6.0 (BURR) ×1 IMPLANT
CAGE ALTERA 10X31X9-13 15D (Cage) IMPLANT
CANISTER SUCT 3000ML PPV (MISCELLANEOUS) ×1 IMPLANT
CAP LOCK DLX THRD (Cap) IMPLANT
CNTNR URN SCR LID CUP LEK RST (MISCELLANEOUS) ×1 IMPLANT
CONT SPEC 4OZ STRL OR WHT (MISCELLANEOUS) ×1
COVER BACK TABLE 60X90IN (DRAPES) ×1 IMPLANT
DRAPE C-ARM 42X72 X-RAY (DRAPES) ×2 IMPLANT
DRAPE HALF SHEET 40X57 (DRAPES) ×1 IMPLANT
DRAPE LAPAROTOMY 100X72X124 (DRAPES) ×1 IMPLANT
DRAPE SURG 17X23 STRL (DRAPES) ×4 IMPLANT
DRSG OPSITE POSTOP 4X6 (GAUZE/BANDAGES/DRESSINGS) ×1 IMPLANT
ELECT BLADE 4.0 EZ CLEAN MEGAD (MISCELLANEOUS) ×1
ELECT REM PT RETURN 9FT ADLT (ELECTROSURGICAL) ×1
ELECTRODE BLDE 4.0 EZ CLN MEGD (MISCELLANEOUS) ×1 IMPLANT
ELECTRODE REM PT RTRN 9FT ADLT (ELECTROSURGICAL) ×1 IMPLANT
EVACUATOR 1/8 PVC DRAIN (DRAIN) IMPLANT
GAUZE 4X4 16PLY ~~LOC~~+RFID DBL (SPONGE) ×1 IMPLANT
GLOVE BIO SURGEON STRL SZ 6 (GLOVE) ×1 IMPLANT
GLOVE BIO SURGEON STRL SZ8 (GLOVE) ×2 IMPLANT
GLOVE BIO SURGEON STRL SZ8.5 (GLOVE) ×2 IMPLANT
GLOVE BIOGEL PI IND STRL 6.5 (GLOVE) ×1 IMPLANT
GLOVE EXAM NITRILE XL STR (GLOVE) IMPLANT
GOWN STRL REUS W/ TWL LRG LVL3 (GOWN DISPOSABLE) ×1 IMPLANT
GOWN STRL REUS W/ TWL XL LVL3 (GOWN DISPOSABLE) ×2 IMPLANT
GOWN STRL REUS W/TWL 2XL LVL3 (GOWN DISPOSABLE) IMPLANT
GOWN STRL REUS W/TWL LRG LVL3 (GOWN DISPOSABLE) ×1
GOWN STRL REUS W/TWL XL LVL3 (GOWN DISPOSABLE) ×2
HEMOSTAT POWDER KIT SURGIFOAM (HEMOSTASIS) ×1 IMPLANT
KIT BASIN OR (CUSTOM PROCEDURE TRAY) ×1 IMPLANT
KIT GRAFTMAG DEL NEURO DISP (NEUROSURGERY SUPPLIES) IMPLANT
KIT POSITION SURG JACKSON T1 (MISCELLANEOUS) ×1 IMPLANT
KIT TURNOVER KIT B (KITS) ×1 IMPLANT
NDL HYPO 21X1.5 SAFETY (NEEDLE) IMPLANT
NDL HYPO 22X1.5 SAFETY MO (MISCELLANEOUS) ×1 IMPLANT
NEEDLE HYPO 21X1.5 SAFETY (NEEDLE) IMPLANT
NEEDLE HYPO 22X1.5 SAFETY MO (MISCELLANEOUS) ×1 IMPLANT
NS IRRIG 1000ML POUR BTL (IV SOLUTION) ×1 IMPLANT
PACK LAMINECTOMY NEURO (CUSTOM PROCEDURE TRAY) ×1 IMPLANT
PAD ARMBOARD 7.5X6 YLW CONV (MISCELLANEOUS) ×3 IMPLANT
PATTIES SURGICAL .5 X1 (DISPOSABLE) IMPLANT
PUTTY DBM PROPEL LRG (Putty) IMPLANT
ROD CREO DLX CVD 6.35X40 (Rod) IMPLANT
ROD CURVED TI 6.35X40 (Rod) ×2 IMPLANT
SCREW PA DLX CREO 7.5X50 (Screw) IMPLANT
SCREW PA DLX CREO 7.5X55 (Screw) IMPLANT
SOL ELECTROSURG ANTI STICK (MISCELLANEOUS)
SOLUTION ELECTROSURG ANTI STCK (MISCELLANEOUS) ×1 IMPLANT
SOLUTION IRRIG SURGIPHOR (IV SOLUTION) ×1 IMPLANT
SPIKE FLUID TRANSFER (MISCELLANEOUS) ×1 IMPLANT
SPONGE NEURO XRAY DETECT 1X3 (DISPOSABLE) IMPLANT
SPONGE SURGIFOAM ABS GEL 100 (HEMOSTASIS) IMPLANT
SPONGE T-LAP 4X18 ~~LOC~~+RFID (SPONGE) IMPLANT
STRIP CLOSURE SKIN 1/2X4 (GAUZE/BANDAGES/DRESSINGS) ×1 IMPLANT
SUT VIC AB 1 CT1 18XBRD ANBCTR (SUTURE) ×2 IMPLANT
SUT VIC AB 1 CT1 8-18 (SUTURE) ×2
SUT VIC AB 2-0 CP2 18 (SUTURE) ×2 IMPLANT
SYR 20ML LL LF (SYRINGE) IMPLANT
TOWEL GREEN STERILE (TOWEL DISPOSABLE) ×1 IMPLANT
TOWEL GREEN STERILE FF (TOWEL DISPOSABLE) ×1 IMPLANT
TRAY FOLEY MTR SLVR 16FR STAT (SET/KITS/TRAYS/PACK) ×1 IMPLANT
WATER STERILE IRR 1000ML POUR (IV SOLUTION) ×1 IMPLANT

## 2023-06-11 NOTE — Op Note (Signed)
Brief history: The patient is a 72 year old white male who has complained of back and bilateral leg pain consistent with neurogenic claudication.  He has failed medical management.  He was worked up with a lumbar MRI and lumbar x-rays which demonstrated an L4-5 spondylolisthesis with severe spinal stenosis.  I discussed the various treatment options with him.  He has decided proceed with surgery.  Preoperative diagnosis: L4-5 spondylolisthesis, facet arthropathy, degenerative disc disease, spinal stenosis compressing both the L4 and the L5 nerve roots; lumbago; lumbar radiculopathy; neurogenic claudication  Postoperative diagnosis: The same  Procedure: Bilateral L4-5 laminotomy/foraminotomies/medial facetectomy to decompress the bilateral L4 and L5 nerve roots(the work required to do this was in addition to the work required to do the posterior lumbar interbody fusion because of the patient's spinal stenosis, facet arthropathy. Etc. requiring a wide decompression of the nerve roots.);  Right L4-5 transforaminal lumbar interbody fusion with local morselized autograft bone and Zimmer DBM; insertion of interbody prosthesis at L4-5 (globus peek expandable interbody prosthesis); posterior nonsegmental instrumentation from L4 to L5 with globus titanium pedicle screws and rods; posterior lateral arthrodesis at L4-5 with local morselized autograft bone and Zimmer DBM.  Surgeon: Dr. Delma Officer  Asst.: Hildred Priest, NP  Anesthesia: Gen. endotracheal  Estimated blood loss: 200 cc  Drains: None  Complications: None  Description of procedure: The patient was brought to the operating room by the anesthesia team. General endotracheal anesthesia was induced. The patient was turned to the prone position on the Wilson frame. The patient's lumbosacral region was then prepared with Betadine scrub and Betadine solution. Sterile drapes were applied.  I then injected the area to be incised with Marcaine with  epinephrine solution. I then used the scalpel to make a linear midline incision over the L4-5 interspace. I then used electrocautery to perform a bilateral subperiosteal dissection exposing the spinous process and lamina of L4-5. We then obtained intraoperative radiograph to confirm our location. We then inserted the Verstrac retractor to provide exposure.  I began the decompression by using the high speed drill to perform laminotomies at L4-5 bilaterally. We then used the Kerrison punches to widen the laminotomy and removed the ligamentum flavum at L4-5 bilaterally. We used the Kerrison punches to remove the medial facets at L4-5 bilaterally, we removed the right L4-5 facet. We performed wide foraminotomies about the bilateral L4 and L5 nerve roots completing the decompression.  We now turned our attention to the posterior lumbar interbody fusion. I used a scalpel to incise the intervertebral disc at L4-5 bilaterally. I then performed a partial intervertebral discectomy at L2-5 bilaterally using the pituitary forceps. We prepared the vertebral endplates at L4-5 bilaterally for the fusion by removing the soft tissues with the curettes. We then used the trial spacers to pick the appropriate sized interbody prosthesis. We prefilled his prosthesis with a combination of local morselized autograft bone that we obtained during the decompression as well as Zimmer DBM. We inserted the prefilled prosthesis into the interspace at L4-5 from the right, we then turned and expanded the prosthesis. There was a good snug fit of the prosthesis in the interspace. We then filled and the remainder of the intervertebral disc space with local morselized autograft bone and Zimmer DBM. This completed the posterior lumbar interbody arthrodesis.  During the decompression and insertion of the prosthesis the assistant protected the thecal sac and nerve roots with the D'Errico retractor.  We now turned attention to the instrumentation.  Under fluoroscopic guidance we cannulated the bilateral  L4 and L5 pedicles with the bone probe. We then removed the bone probe. We then tapped the pedicle with a 6.5 millimeter tap. We then removed the tap. We probed inside the tapped pedicle with a ball probe to rule out cortical breaches. We then inserted a 7.5 x 50 and 55 millimeter pedicle screw into the L4 and L5 pedicles bilaterally under fluoroscopic guidance. We then palpated along the medial aspect of the pedicles to rule out cortical breaches. There were none. The nerve roots were not injured. We then connected the unilateral pedicle screws with a lordotic rod. We compressed the construct and secured the rod in place with the caps. We then tightened the caps appropriately. This completed the instrumentation from L4-5 bilaterally.  We now turned our attention to the posterior lateral arthrodesis at L4-5. We used the high-speed drill to decorticate the remainder of the facets, pars, transverse process at L4-5. We then applied a combination of local morselized autograft bone and Zimmer DBM over these decorticated posterior lateral structures. This completed the posterior lateral arthrodesis.  We then obtained hemostasis using bipolar electrocautery. We irrigated the wound out with Betadine solution. We inspected the thecal sac and nerve roots and noted they were well decompressed. We then removed the retractor.  We injected Exparel . We reapproximated patient's thoracolumbar fascia with interrupted #1 Vicryl suture. We reapproximated patient's subcutaneous tissue with interrupted 2-0 Vicryl suture. The reapproximated patient's skin with Steri-Strips and benzoin. The wound was then coated with bacitracin ointment. A sterile dressing was applied. The drapes were removed. The patient was subsequently returned to the supine position where they were extubated by the anesthesia team. He was then transported to the post anesthesia care unit in stable condition.  All sponge instrument and needle counts were reportedly correct at the end of this case.

## 2023-06-11 NOTE — Progress Notes (Signed)
Orthopedic Tech Progress Note Patient Details:  CARLITOS BOTTINO March 21, 1951 [id number]   Ortho Devices Type of Ortho Device: Lumbar corsett Ortho Device/Splint Location: adjusted, at bedside Ortho Device/Splint Interventions: Ordered, Adjustment   Post Interventions Patient Tolerated: Well Instructions Provided: Care of device, Adjustment of device  Kyomi Hector Carmine Savoy 06/11/2023, 5:01 PM

## 2023-06-11 NOTE — H&P (Signed)
Subjective: The patient is a 72 year old white male close planes of back and right greater left leg pain consistent with neurogenic claudication.  He has failed medical management.  He was worked up with a lumbar MRI which demonstrated lumbar spine listhesis and severe stenosis.  I discussed the various treatment options with him.  He has decided to proceed with surgery.  Past Medical History:  Diagnosis Date   Arthritis    Barrett's esophagus    BPH (benign prostatic hyperplasia)    Chronic kidney disease    Fracture of 5th metatarsal    GERD (gastroesophageal reflux disease)    Hypercholesteremia    Hypertension    Mastoiditis    Pre-diabetes    Ulcer disease    Bulbar    Past Surgical History:  Procedure Laterality Date   BIOPSY  12/12/2021   Procedure: BIOPSY;  Surgeon: Malissa Hippo, MD;  Location: [facility];  Service: Endoscopy;;   COLONOSCOPY     COLONOSCOPY     COLONOSCOPY N/A 08/27/2016   Procedure: COLONOSCOPY;  Surgeon: Malissa Hippo, MD;  Location: [facility];  Service: Endoscopy;  Laterality: N/A;  930 - moved to 9/27 @ 12:00 - Ann notified pt   COLONOSCOPY N/A 12/12/2021   Procedure: COLONOSCOPY;  Surgeon: Malissa Hippo, MD;  Location: [facility];  Service: Endoscopy;  Laterality: N/A;  730   HEMORRHOID SURGERY  2009   INGUINAL HERNIA REPAIR Right 10/15/2020   Procedure: OPEN RIGHT INGUINAL HERNIA REPAIR WITH MESH;  Surgeon: Ovidio Kin, MD;  Location: [facility] OR;  Service: General;  Laterality: Right;   INSERTION OF MESH Right 10/15/2020   Procedure: INSERTION OF MESH;  Surgeon: Ovidio Kin, MD;  Location: [facility] OR;  Service: General;  Laterality: Right;   KNEE ARTHROSCOPY     Right Knee   ORIF TOE FRACTURE Left 12/09/2013   Procedure: LEFT OPEN REDUCTION INTERNAL FIXATION (ORIF) FIFTH METATARSAL FRACTURE;  Surgeon: Nilda Simmer, MD;  Location: [facility];  Service: Orthopedics;  Laterality: Left;   POLYPECTOMY  08/27/2016   Procedure:  POLYPECTOMY;  Surgeon: Malissa Hippo, MD;  Location: [facility];  Service: Endoscopy;;  colon   POLYPECTOMY  12/12/2021   Procedure: POLYPECTOMY;  Surgeon: Malissa Hippo, MD;  Location: [facility];  Service: Endoscopy;;   rotor cuff  2009   right-   TONSILLECTOMY     UPPER GASTROINTESTINAL ENDOSCOPY      Allergies  Allergen Reactions   Metformin    Ampicillin Rash    Social History   Tobacco Use   Smoking status: Never   Smokeless tobacco: Never  Substance Use Topics   Alcohol use: No    Family History  Problem Relation Age of Onset   COPD Mother    Heart disease Father    Healthy Sister    Lung cancer Brother    Heart disease Brother    Heart disease Sister    Prostate cancer Brother    Healthy Son    Healthy Son    Prior to Admission medications   Medication Sig Start Date End Date Taking? Authorizing Provider  amLODipine (NORVASC) 5 MG tablet Take 5 mg by mouth daily.   Yes [provider]  aspirin EC 81 MG tablet Take 1 tablet (81 mg total) by mouth daily. Swallow whole. 12/13/21  Yes Rehman, Joline Maxcy, MD  chlorthalidone (HYGROTON) 25 MG tablet Take 25 mg by mouth daily. 02/23/19  Yes [provider]  ezetimibe (ZETIA) 10 MG tablet Take 10 mg by mouth at bedtime. 08/04/20  Yes [provider]  Ginger, Zingiber officinalis, (GINGER PO) Take 1 capsule by mouth daily.   Yes [provider]  Glucosamine HCl 1000 MG TABS Take 2,000 mg by mouth daily.   Yes [provider]  losartan (COZAAR) 100 MG tablet Take 100 mg by mouth daily.   Yes [provider]  Misc Natural Products (TART CHERRY ADVANCED PO) Take 1 tablet by mouth daily.   Yes [provider]  Multiple Vitamin (MULTIVITAMIN WITH MINERALS) TABS tablet Take 1 tablet by mouth daily.   Yes [provider]  Omega-3 Fatty Acids (FISH OIL) 1000 MG CAPS Take 1,000 mg by mouth daily.   Yes [provider]  omeprazole (PRILOSEC) 20 MG  capsule Take 20 mg by mouth daily.  04/04/19  Yes [provider]  pioglitazone (ACTOS) 15 MG tablet Take 15 mg by mouth daily.   Yes [provider]  TURMERIC PO Take 1 capsule by mouth daily.   Yes [provider]     Review of Systems  Positive ROS: As above  All other systems have been reviewed and were otherwise negative with the exception of those mentioned in the HPI and as above.  Objective: Vital signs in last 24 hours: Temp:  [98.3 F (36.8 C)] 98.3 F (36.8 C) (07/11 0820) Pulse Rate:  [67] 67 (07/11 0820) Resp:  [18] 18 (07/11 0820) BP: (135)/(87) 135/87 (07/11 0820) SpO2:  [95 %] 95 % (07/11 0820) Weight:  [87 kg] 87 kg (07/11 0820) Estimated body mass index is 26.75 kg/m as calculated from the following:   Height as of this encounter: 5\' 11"  (1.803 m).   Weight as of this encounter: 87 kg.   General Appearance: Alert Head: Normocephalic, without obvious abnormality, atraumatic Eyes: PERRL, conjunctiva/corneas clear, EOM's intact,    Ears: Normal  Throat: Normal  Neck: Supple, Back: unremarkable Lungs: Clear to auscultation bilaterally, respirations unlabored Heart: Regular rate and rhythm, no murmur, rub or gallop Abdomen: Soft, non-tender Extremities: Extremities normal, atraumatic, no cyanosis or edema Skin: unremarkable  NEUROLOGIC:   Mental status: alert and oriented,Motor Exam - grossly normal Sensory Exam - grossly normal Reflexes:  Coordination - grossly normal Gait - grossly normal Balance - grossly normal Cranial Nerves: I: smell Not tested  II: visual acuity  OS: Normal  OD: Normal   II: visual fields Full to confrontation  II: pupils Equal, round, reactive to light  III,VII: ptosis None  III,IV,VI: extraocular muscles  Full ROM  V: mastication Normal  V: facial light touch sensation  Normal  V,VII: corneal reflex  Present  VII: facial muscle function - upper  Normal  VII: facial muscle function - lower Normal   VIII: hearing Not tested  IX: soft palate elevation  Normal  IX,X: gag reflex Present  XI: trapezius strength  5/5  XI: sternocleidomastoid strength 5/5  XI: neck flexion strength  5/5  XII: tongue strength  Normal    Data Review Lab Results  Component Value Date   WBC 6.5 05/28/2023   HGB 14.2 05/28/2023   HCT 43.2 05/28/2023   MCV 90.0 05/28/2023   PLT 315 05/28/2023   Lab Results  Component Value Date   NA 139 05/28/2023   K 3.4 (L) 05/28/2023   CL 104 05/28/2023   CO2 24 05/28/2023   BUN 25 (H) 05/28/2023   CREATININE 1.25 (H) 05/28/2023  GLUCOSE 141 (H) 05/28/2023   No results found for: "INR", "PROTIME"  Assessment/Plan: Lumbar spine listhesis, lumbar spinal stenosis, lumbar facet arthropathy, lumbar radiculopathy, neurogenic claudication, lumbago: I discussed the situation with the patient.  I reviewed his imaging studies with him and pointed out the abnormalities.  We have discussed the various treatment options including surgery.  I have described the surgical treatment option of an L4-5 decompression, instrumentation and fusion.  I have shown him surgical models.  I have given him a surgical pamphlet.  We have discussed the risk, benefits, alternatives, expected postop course, and likelihood of achieving our goals with surgery.  I have answered all his questions.  He has decided proceed with surgery.   Cristi Loron 06/11/2023 9:49 AM

## 2023-06-11 NOTE — Transfer of Care (Signed)
Immediate Anesthesia Transfer of Care Note  Patient: Frank [name]  Procedure(s) Performed: Posterior Lumbar Interbody Fusion,POSTERIOR INSTRUMENTATION Lumbar four-five (Spine Lumbar)  Patient Location: PACU  Anesthesia Type:General  Level of Consciousness: drowsy and patient cooperative  Airway & Oxygen Therapy: Patient Spontanous Breathing and Patient connected to face mask oxygen  Post-op Assessment: Report given to RN and Post -op Vital signs reviewed and stable  Post vital signs: Reviewed and stable  Last Vitals:  Vitals Value Taken Time  BP    Temp    Pulse    Resp    SpO2      Last Pain:  Vitals:   06/11/23 0826  PainSc: 0-No pain      Patients Stated Pain Goal: 0 (06/11/23 0826)  Complications: No notable events documented.

## 2023-06-11 NOTE — Anesthesia Procedure Notes (Signed)
Procedure Name: Intubation Date/Time: 06/11/2023 10:43 AM  Performed by: Carolynne Edouard, RNPre-anesthesia Checklist: Patient identified, Emergency Drugs available, Suction available and Patient being monitored Patient Re-evaluated:Patient Re-evaluated prior to induction Oxygen Delivery Method: Circle system utilized Preoxygenation: Pre-oxygenation with 100% oxygen Induction Type: IV induction Ventilation: Mask ventilation without difficulty Laryngoscope Size: Mac and 4 Grade View: Grade I Tube type: Oral Tube size: 7.5 mm Number of attempts: 1 Airway Equipment and Method: Stylet and Oral airway Placement Confirmation: ETT inserted through vocal cords under direct vision, positive ETCO2 and breath sounds checked- equal and bilateral Secured at: 23 cm Tube secured with: Tape Dental Injury: Teeth and Oropharynx as per pre-operative assessment

## 2023-06-12 DIAGNOSIS — M4316 Spondylolisthesis, lumbar region: Secondary | ICD-10-CM | POA: Diagnosis not present

## 2023-06-12 MED ORDER — OXYCODONE-ACETAMINOPHEN 5-325 MG PO TABS: 1.0000 | ORAL_TABLET | ORAL | 0 refills | Status: DC | PRN

## 2023-06-12 MED ORDER — OXYCODONE-ACETAMINOPHEN 5-325 MG PO TABS: 1.0000 | ORAL_TABLET | ORAL | Status: DC | PRN

## 2023-06-12 MED ORDER — DOCUSATE SODIUM 100 MG PO CAPS: 100.0000 mg | ORAL_CAPSULE | Freq: Two times a day (BID) | ORAL | 0 refills | Status: AC

## 2023-06-12 MED ORDER — CYCLOBENZAPRINE HCL 5 MG PO TABS: 5.0000 mg | ORAL_TABLET | Freq: Three times a day (TID) | ORAL | 0 refills | Status: AC | PRN

## 2023-06-12 MED ORDER — CYCLOBENZAPRINE HCL 5 MG PO TABS: 5.0000 mg | ORAL_TABLET | Freq: Three times a day (TID) | ORAL | Status: DC | PRN

## 2023-06-12 MED FILL — Thrombin For Soln 5000 Unit: CUTANEOUS | Qty: 5000 | Status: AC

## 2023-06-12 NOTE — Anesthesia Postprocedure Evaluation (Signed)
Anesthesia Post Note  Patient: Frank [name]  Procedure(s) Performed: Posterior Lumbar Interbody Fusion,POSTERIOR INSTRUMENTATION Lumbar four-five (Spine Lumbar)     Patient location during evaluation: PACU Anesthesia Type: General Level of consciousness: awake and alert Pain management: pain level controlled Vital Signs Assessment: post-procedure vital signs reviewed and stable Respiratory status: spontaneous breathing, nonlabored ventilation, respiratory function stable and patient connected to nasal cannula oxygen Cardiovascular status: blood pressure returned to baseline and stable Postop Assessment: no apparent nausea or vomiting Anesthetic complications: no   No notable events documented.  Last Vitals:  Vitals:   06/12/23 0548 06/12/23 0717  BP: 109/68 109/71  Pulse: 69 65  Resp:  20  Temp:  36.9 C  SpO2: 97% 95%    Last Pain:  Vitals:   06/12/23 0717  TempSrc: Oral  PainSc:                  Talishia Betzler S

## 2023-06-12 NOTE — Evaluation (Signed)
Physical Therapy Evaluation Patient Details Name: Frank [name] MRN: [id number] DOB: Jan 04, 1951 Today's Date: 06/12/2023  History of Present Illness  72 y.o. male s/p Bilateral L4-5 laminotomy/foraminotomies/medial facetectomy (06/11/2023). PMH significant for: BPH, CKD, GERD, HTN, Mastoiditis.  Clinical Impression  Prior to admission, patient reported independence with functional mobility and ADLs. Occasional use of RW for household ambulation due to pain. Patient is supported by spouse and sister in law whom will be available to assist following discharge. Patient was able to perform bed mobility, transfers, and gait training without physical assistance from PT. Educated patient on exercise progression, car transfers, and spinal precautions. Patient able to maintain spinal precautions throughout session. PT recommends discharge to home with family assistance as needed. No further acute PT needed.         Assistance Recommended at Discharge PRN  If plan is discharge home, recommend the following:  Can travel by private vehicle  A little help with bathing/dressing/bathroom;Assist for transportation;Help with stairs or ramp for entrance        Equipment Recommendations None recommended by PT  Recommendations for Other Services       Functional Status Assessment Patient has had a recent decline in their functional status and demonstrates the ability to make significant improvements in function in a reasonable and predictable amount of time.     Precautions / Restrictions Precautions Precautions: Back Precaution Booklet Issued: Yes (comment) Precaution Comments: Handout Provided and verbalized throughout functional mobility. Required Braces or Orthoses: Spinal Brace Spinal Brace: Lumbar corset;Applied in standing position Restrictions Weight Bearing Restrictions: No      Mobility  Bed Mobility Overal bed mobility: Modified Independent             General bed mobility  comments: Able to demonstrate ability to transition to EOB without physical assistance. Increased time but able to maintain spinal precautions.    Transfers Overall transfer level: Independent Equipment used: None               General transfer comment: Transferred from bed without physical assistance    Ambulation/Gait Ambulation/Gait assistance: Supervision Gait Distance (Feet): 450 Feet Assistive device: Rolling walker (2 wheels) Gait Pattern/deviations: Step-through pattern Gait velocity: Decreased Gait velocity interpretation: <1.8 ft/sec, indicate of risk for recurrent falls   General Gait Details: Presented with decreased velocity and step through pattern. Able to demonstrate upright posture, PT provided vc for proximity to RE.  Stairs Stairs: Yes Stairs assistance: Min guard Stair Management: One rail Left, One rail Right, Alternating pattern, Forwards Number of Stairs: 10 General stair comments: Min Guard for safety only, patient able to navigate without physical assistance and step to pattern.  Wheelchair Mobility     Tilt Bed    Modified Rankin (Stroke Patients Only)       Balance Overall balance assessment: Needs assistance Sitting-balance support: No upper extremity supported, Feet supported Sitting balance-Leahy Scale: Good Sitting balance - Comments: Able to sit EOB without UE support   Standing balance support: No upper extremity supported, Bilateral upper extremity supported, During functional activity Standing balance-Leahy Scale: Fair Standing balance comment: Walked around the room without UE support, used RW througout gait training.                             Pertinent Vitals/Pain Pain Assessment Pain Assessment: 0-10 Pain Score: 5  Pain Location: Incision Site Pain Descriptors / Indicators: Operative site guarding, Sore Pain Intervention(s): Monitored during session  Home Living Family/patient expects to be discharged  to:: Private residence Living Arrangements: Spouse/significant other Available Help at Discharge: Family;Available 24 hours/day Type of Home: House Home Access: Stairs to enter Entrance Stairs-Rails: Can reach both Entrance Stairs-Number of Steps: 4 Alternate Level Stairs-Number of Steps: 12 Home Layout: Two level;Able to live on main level with bedroom/bathroom Home Equipment: Rolling Walker (2 wheels);Grab bars - tub/shower;Shower seat      Prior Function Prior Level of Function : Independent/Modified Independent             Mobility Comments: Independent ADLs Comments: Independent     Hand Dominance   Dominant Hand: Right    Extremity/Trunk Assessment   Upper Extremity Assessment Upper Extremity Assessment: Overall WFL for tasks assessed    Lower Extremity Assessment Lower Extremity Assessment: Overall WFL for tasks assessed    Cervical / Trunk Assessment Cervical / Trunk Assessment: Normal  Communication   Communication: No difficulties  Cognition Arousal/Alertness: Awake/alert Behavior During Therapy: WFL for tasks assessed/performed Overall Cognitive Status: Within Functional Limits for tasks assessed                                 General Comments: A+Ox4, Pleasant throughout entire session.        General Comments      Exercises     Assessment/Plan    PT Assessment Patient does not need any further PT services  PT Problem List         PT Treatment Interventions      PT Goals (Current goals can be found in the Care Plan section)  Acute Rehab PT Goals Patient Stated Goal: Patient would like to return home PT Goal Formulation: All assessment and education complete, DC therapy    Frequency       Co-evaluation               AM-PAC PT "6 Clicks" Mobility  Outcome Measure Help needed turning from your back to your side while in a flat bed without using bedrails?: None Help needed moving from lying on your back to  sitting on the side of a flat bed without using bedrails?: None Help needed moving to and from a bed to a chair (including a wheelchair)?: None Help needed standing up from a chair using your arms (e.g., wheelchair or bedside chair)?: None Help needed to walk in hospital room?: None Help needed climbing 3-5 steps with a railing? : A Little 6 Click Score: 23    End of Session Equipment Utilized During Treatment: Gait belt;Back brace Activity Tolerance: Patient tolerated treatment well Patient left: in bed;with call bell/phone within reach Nurse Communication: Mobility status PT Visit Diagnosis: Difficulty in walking, not elsewhere classified (R26.2);Pain Pain - part of body:  (Lower Back)    Time: 1610-9604 PT Time Calculation (min) (ACUTE ONLY): 21 min   Charges:   PT Evaluation $PT Eval Low Complexity: 1 Low   PT General Charges $$ ACUTE PT VISIT: 1 Visit         Christene Lye, SPT Acute Rehabilitation Services [phone] Secure chat preferred    Christene Lye 06/12/2023, 12:34 PM

## 2023-06-12 NOTE — Progress Notes (Signed)
Patient alert and oriented, void, ambulate. Surgical site clean and dry. D/c instructions explain and given. Patient d/c home per order.

## 2023-06-12 NOTE — Plan of Care (Signed)

## 2023-06-12 NOTE — Discharge Summary (Signed)
Physician Discharge Summary  Patient ID: Frank [name] MRN: [id number] DOB/AGE: 01/09/1951 72 y.o.  Admit date: 06/11/2023 Discharge date: 06/12/2023  Admission Diagnoses: Lumbar spondylolisthesis, lumbar facet arthropathy, lumbar spinal stenosis, neurogenic claudication, lumbar radiculopathy, lumbago  Discharge Diagnoses: The same Principal Problem:   Spondylolisthesis of lumbar region   Discharged Condition: good  Hospital Course: I performed an L4-5 decompression, instrumentation and fusion on the patient on 06/11/2023.  The surgery went well.  The patient's postoperative course was unremarkable.  On postoperative day #1 he felt well and requested discharge to home.  The patient was given verbal and written discharge instructions.  All his questions were answered.  Consults: PT, care management Significant Diagnostic Studies: None Treatments: L4-5 decompression, instrumentation and fusion. Discharge Exam: Blood pressure 109/68, pulse 69, temperature 98.2 F (36.8 C), temperature source Oral, resp. rate 20, height 5\' 11"  (1.803 m), weight 87 kg, SpO2 97%. The patient is alert and pleasant.  He looks well.  His strength is normal.  Disposition: Home  Discharge Instructions     Call MD for:  difficulty breathing, headache or visual disturbances   Complete by: As directed    Call MD for:  extreme fatigue   Complete by: As directed    Call MD for:  hives   Complete by: As directed    Call MD for:  persistant dizziness or light-headedness   Complete by: As directed    Call MD for:  persistant nausea and vomiting   Complete by: As directed    Call MD for:  redness, tenderness, or signs of infection (pain, swelling, redness, odor or green/yellow discharge around incision site)   Complete by: As directed    Call MD for:  severe uncontrolled pain   Complete by: As directed    Call MD for:  temperature >100.4   Complete by: As directed    Diet - low sodium heart healthy    Complete by: As directed    Discharge instructions   Complete by: As directed    Call [phone] for a followup appointment. Take a stool softener while you are using pain medications.   Driving Restrictions   Complete by: As directed    Do not drive for 2 weeks.   Increase activity slowly   Complete by: As directed    Lifting restrictions   Complete by: As directed    Do not lift more than 5 pounds. No excessive bending or twisting.   May shower / Bathe   Complete by: As directed    Remove the dressing for 3 days after surgery.  You may shower, but leave the incision alone.   Remove dressing in 48 hours   Complete by: As directed       Allergies as of 06/12/2023       Reactions   Metformin    Ampicillin Rash        Medication List     TAKE these medications    amLODipine 5 MG tablet Commonly known as: NORVASC Take 5 mg by mouth daily.   aspirin EC 81 MG tablet Take 1 tablet (81 mg total) by mouth daily. Swallow whole.   chlorthalidone 25 MG tablet Commonly known as: HYGROTON Take 25 mg by mouth daily.   cyclobenzaprine 5 MG tablet Commonly known as: FLEXERIL Take 1 tablet (5 mg total) by mouth 3 (three) times daily as needed for muscle spasms.   docusate sodium 100 MG capsule Commonly known as: COLACE Take 1 capsule (  100 mg total) by mouth 2 (two) times daily.   ezetimibe 10 MG tablet Commonly known as: ZETIA Take 10 mg by mouth at bedtime.   Fish Oil 1000 MG Caps Take 1,000 mg by mouth daily.   GINGER PO Take 1 capsule by mouth daily.   Glucosamine HCl 1000 MG Tabs Take 2,000 mg by mouth daily.   losartan 100 MG tablet Commonly known as: COZAAR Take 100 mg by mouth daily.   multivitamin with minerals Tabs tablet Take 1 tablet by mouth daily.   omeprazole 20 MG capsule Commonly known as: PRILOSEC Take 20 mg by mouth daily.   oxyCODONE-acetaminophen 5-325 MG tablet Commonly known as: PERCOCET/ROXICET Take 1-2 tablets by mouth every 4  (four) hours as needed for moderate pain.   pioglitazone 15 MG tablet Commonly known as: ACTOS Take 15 mg by mouth daily.   TART CHERRY ADVANCED PO Take 1 tablet by mouth daily.   TURMERIC PO Take 1 capsule by mouth daily.         Signed: Cristi Loron 06/12/2023, 6:51 AM

## 2023-06-17 DIAGNOSIS — R509 Fever, unspecified: Secondary | ICD-10-CM | POA: Diagnosis not present

## 2023-06-17 DIAGNOSIS — W19XXXA Unspecified fall, initial encounter: Secondary | ICD-10-CM | POA: Diagnosis not present

## 2023-06-19 ENCOUNTER — Encounter (HOSPITAL_COMMUNITY): Payer: Self-pay | Admitting: Emergency Medicine

## 2023-06-19 ENCOUNTER — Emergency Department (HOSPITAL_COMMUNITY): Payer: PPO

## 2023-06-19 ENCOUNTER — Inpatient Hospital Stay (HOSPITAL_COMMUNITY)
Admission: EM | Admit: 2023-06-19 | Discharge: 2023-06-24 | DRG: 391 | Disposition: A | Discharge status: Home / Self Care | Payer: PPO | Attending: General Surgery | Admitting: General Surgery

## 2023-06-19 ENCOUNTER — Other Ambulatory Visit: Payer: Self-pay

## 2023-06-19 DIAGNOSIS — Z8249 Family history of ischemic heart disease and other diseases of the circulatory system: Secondary | ICD-10-CM

## 2023-06-19 DIAGNOSIS — Z888 Allergy status to other drugs, medicaments and biological substances status: Secondary | ICD-10-CM

## 2023-06-19 DIAGNOSIS — I129 Hypertensive chronic kidney disease with stage 1 through stage 4 chronic kidney disease, or unspecified chronic kidney disease: Secondary | ICD-10-CM | POA: Diagnosis present

## 2023-06-19 DIAGNOSIS — M19012 Primary osteoarthritis, left shoulder: Secondary | ICD-10-CM | POA: Diagnosis not present

## 2023-06-19 DIAGNOSIS — M25712 Osteophyte, left shoulder: Secondary | ICD-10-CM | POA: Diagnosis not present

## 2023-06-19 DIAGNOSIS — E878 Other disorders of electrolyte and fluid balance, not elsewhere classified: Secondary | ICD-10-CM | POA: Diagnosis present

## 2023-06-19 DIAGNOSIS — Z825 Family history of asthma and other chronic lower respiratory diseases: Secondary | ICD-10-CM

## 2023-06-19 DIAGNOSIS — Z7982 Long term (current) use of aspirin: Secondary | ICD-10-CM

## 2023-06-19 DIAGNOSIS — B952 Enterococcus as the cause of diseases classified elsewhere: Secondary | ICD-10-CM | POA: Diagnosis present

## 2023-06-19 DIAGNOSIS — K5732 Diverticulitis of large intestine without perforation or abscess without bleeding: Secondary | ICD-10-CM

## 2023-06-19 DIAGNOSIS — K5730 Diverticulosis of large intestine without perforation or abscess without bleeding: Secondary | ICD-10-CM | POA: Diagnosis not present

## 2023-06-19 DIAGNOSIS — E876 Hypokalemia: Secondary | ICD-10-CM | POA: Diagnosis present

## 2023-06-19 DIAGNOSIS — R198 Other specified symptoms and signs involving the digestive system and abdomen: Secondary | ICD-10-CM

## 2023-06-19 DIAGNOSIS — N200 Calculus of kidney: Secondary | ICD-10-CM | POA: Diagnosis not present

## 2023-06-19 DIAGNOSIS — N179 Acute kidney failure, unspecified: Secondary | ICD-10-CM | POA: Diagnosis present

## 2023-06-19 DIAGNOSIS — Z981 Arthrodesis status: Secondary | ICD-10-CM

## 2023-06-19 DIAGNOSIS — R5082 Postprocedural fever: Secondary | ICD-10-CM | POA: Diagnosis not present

## 2023-06-19 DIAGNOSIS — R001 Bradycardia, unspecified: Secondary | ICD-10-CM | POA: Diagnosis present

## 2023-06-19 DIAGNOSIS — Z79899 Other long term (current) drug therapy: Secondary | ICD-10-CM

## 2023-06-19 DIAGNOSIS — B9620 Unspecified Escherichia coli [E. coli] as the cause of diseases classified elsewhere: Secondary | ICD-10-CM | POA: Diagnosis present

## 2023-06-19 DIAGNOSIS — L539 Erythematous condition, unspecified: Secondary | ICD-10-CM | POA: Diagnosis present

## 2023-06-19 DIAGNOSIS — K219 Gastro-esophageal reflux disease without esophagitis: Secondary | ICD-10-CM | POA: Diagnosis present

## 2023-06-19 DIAGNOSIS — Z9181 History of falling: Secondary | ICD-10-CM

## 2023-06-19 DIAGNOSIS — K56609 Unspecified intestinal obstruction, unspecified as to partial versus complete obstruction: Secondary | ICD-10-CM | POA: Diagnosis not present

## 2023-06-19 DIAGNOSIS — I10 Essential (primary) hypertension: Secondary | ICD-10-CM | POA: Diagnosis present

## 2023-06-19 DIAGNOSIS — K5669 Other partial intestinal obstruction: Secondary | ICD-10-CM | POA: Diagnosis present

## 2023-06-19 DIAGNOSIS — Z880 Allergy status to penicillin: Secondary | ICD-10-CM

## 2023-06-19 DIAGNOSIS — K529 Noninfective gastroenteritis and colitis, unspecified: Secondary | ICD-10-CM | POA: Diagnosis present

## 2023-06-19 DIAGNOSIS — R7303 Prediabetes: Secondary | ICD-10-CM | POA: Diagnosis present

## 2023-06-19 DIAGNOSIS — Z7984 Long term (current) use of oral hypoglycemic drugs: Secondary | ICD-10-CM

## 2023-06-19 DIAGNOSIS — K5720 Diverticulitis of large intestine with perforation and abscess without bleeding: Principal | ICD-10-CM | POA: Diagnosis present

## 2023-06-19 DIAGNOSIS — E7800 Pure hypercholesterolemia, unspecified: Secondary | ICD-10-CM | POA: Diagnosis present

## 2023-06-19 DIAGNOSIS — Z8042 Family history of malignant neoplasm of prostate: Secondary | ICD-10-CM

## 2023-06-19 DIAGNOSIS — N400 Enlarged prostate without lower urinary tract symptoms: Secondary | ICD-10-CM | POA: Diagnosis present

## 2023-06-19 DIAGNOSIS — E871 Hypo-osmolality and hyponatremia: Secondary | ICD-10-CM | POA: Diagnosis present

## 2023-06-19 DIAGNOSIS — Z801 Family history of malignant neoplasm of trachea, bronchus and lung: Secondary | ICD-10-CM

## 2023-06-19 DIAGNOSIS — R109 Unspecified abdominal pain: Secondary | ICD-10-CM | POA: Diagnosis not present

## 2023-06-19 DIAGNOSIS — R9431 Abnormal electrocardiogram [ECG] [EKG]: Secondary | ICD-10-CM | POA: Diagnosis not present

## 2023-06-19 DIAGNOSIS — K651 Peritoneal abscess: Secondary | ICD-10-CM | POA: Diagnosis present

## 2023-06-19 DIAGNOSIS — R21 Rash and other nonspecific skin eruption: Secondary | ICD-10-CM | POA: Diagnosis present

## 2023-06-19 LAB — CBC
HCT: 34.1 % — ABNORMAL LOW (ref 39.0–52.0)
Hemoglobin: 11.8 g/dL — ABNORMAL LOW (ref 13.0–17.0)
MCH: 29.6 pg (ref 26.0–34.0)
MCHC: 34.6 g/dL (ref 30.0–36.0)
MCV: 85.7 fL (ref 80.0–100.0)
Platelets: 440 10*3/uL — ABNORMAL HIGH (ref 150–400)
RBC: 3.98 MIL/uL — ABNORMAL LOW (ref 4.22–5.81)
RDW: 13.5 % (ref 11.5–15.5)
WBC: 12.9 10*3/uL — ABNORMAL HIGH (ref 4.0–10.5)
nRBC: 0 % (ref 0.0–0.2)

## 2023-06-19 LAB — URINALYSIS, ROUTINE W REFLEX MICROSCOPIC
Bacteria, UA: NONE SEEN
Bilirubin Urine: NEGATIVE
Glucose, UA: NEGATIVE mg/dL
Ketones, ur: NEGATIVE mg/dL
Leukocytes,Ua: NEGATIVE
Nitrite: NEGATIVE
Protein, ur: 30 mg/dL — AB
Specific Gravity, Urine: 1.018 (ref 1.005–1.030)
pH: 5 (ref 5.0–8.0)

## 2023-06-19 LAB — COMPREHENSIVE METABOLIC PANEL
ALT: 32 U/L (ref 0–44)
AST: 33 U/L (ref 15–41)
Albumin: 3.1 g/dL — ABNORMAL LOW (ref 3.5–5.0)
Alkaline Phosphatase: 59 U/L (ref 38–126)
Anion gap: 15 (ref 5–15)
BUN: 42 mg/dL — ABNORMAL HIGH (ref 8–23)
CO2: 32 mmol/L (ref 22–32)
Calcium: 9.1 mg/dL (ref 8.9–10.3)
Chloride: 84 mmol/L — ABNORMAL LOW (ref 98–111)
Creatinine, Ser: 1.62 mg/dL — ABNORMAL HIGH (ref 0.61–1.24)
GFR, Estimated: 45 mL/min — ABNORMAL LOW (ref >60)
Glucose, Bld: 162 mg/dL — ABNORMAL HIGH (ref 70–99)
Potassium: 2.4 mmol/L — CL (ref 3.5–5.1)
Sodium: 131 mmol/L — ABNORMAL LOW (ref 135–145)
Total Bilirubin: 1.7 mg/dL — ABNORMAL HIGH (ref 0.3–1.2)
Total Protein: 7.4 g/dL (ref 6.5–8.1)

## 2023-06-19 LAB — LACTIC ACID, PLASMA: Lactic Acid, Venous: 1.3 mmol/L (ref 0.5–1.9)

## 2023-06-19 LAB — MAGNESIUM: Magnesium: 2 mg/dL (ref 1.7–2.4)

## 2023-06-19 LAB — LIPASE, BLOOD: Lipase: 23 U/L (ref 11–51)

## 2023-06-19 MED ORDER — POTASSIUM CHLORIDE 10 MEQ/100ML IV SOLN
INTRAVENOUS | Status: AC
  Filled 2023-06-19: qty 100

## 2023-06-19 MED ORDER — POTASSIUM CHLORIDE 10 MEQ/100ML IV SOLN
INTRAVENOUS | Status: AC
  Administered 2023-06-19: 10 meq via INTRAVENOUS
  Filled 2023-06-19: qty 100

## 2023-06-19 MED ORDER — FAMOTIDINE IN NACL 20-0.9 MG/50ML-% IV SOLN
20.0000 mg | Freq: Once | INTRAVENOUS | Status: AC
  Administered 2023-06-20: 20 mg via INTRAVENOUS
  Filled 2023-06-19: qty 50

## 2023-06-19 MED ORDER — IOHEXOL 300 MG/ML  SOLN
80.0000 mL | Freq: Once | INTRAMUSCULAR | Status: AC | PRN
  Administered 2023-06-19: 80 mL via INTRAVENOUS

## 2023-06-19 MED ORDER — POTASSIUM CHLORIDE 10 MEQ/100ML IV SOLN
10.0000 meq | INTRAVENOUS | Status: DC
  Administered 2023-06-19 (×2): 10 meq via INTRAVENOUS

## 2023-06-19 MED ORDER — LACTATED RINGERS IV BOLUS
1000.0000 mL | Freq: Once | INTRAVENOUS | Status: AC
  Administered 2023-06-19: 1000 mL via INTRAVENOUS

## 2023-06-19 MED ORDER — FENTANYL CITRATE PF 50 MCG/ML IJ SOSY
50.0000 ug | PREFILLED_SYRINGE | Freq: Once | INTRAMUSCULAR | Status: AC
  Administered 2023-06-19: 50 ug via INTRAVENOUS
  Filled 2023-06-19: qty 1

## 2023-06-19 MED ORDER — ONDANSETRON HCL 4 MG/2ML IJ SOLN
4.0000 mg | Freq: Once | INTRAMUSCULAR | Status: AC
  Administered 2023-06-19: 4 mg via INTRAVENOUS
  Filled 2023-06-19: qty 2

## 2023-06-19 MED ORDER — METRONIDAZOLE 500 MG/100ML IV SOLN
500.0000 mg | Freq: Once | INTRAVENOUS | Status: AC
  Administered 2023-06-19: 500 mg via INTRAVENOUS
  Filled 2023-06-19: qty 100

## 2023-06-19 MED ORDER — SODIUM CHLORIDE 0.9 % IV SOLN
2.0000 g | Freq: Once | INTRAVENOUS | Status: AC
  Administered 2023-06-19: 2 g via INTRAVENOUS
  Filled 2023-06-19: qty 12.5

## 2023-06-19 NOTE — ED Notes (Signed)
Pt requests something for burping and reflux Last took rolaids at home at 17:00 Informed MD

## 2023-06-19 NOTE — ED Notes (Signed)
Pt returned from CT Pt is attached to the full monitor  Wife at bedside Vitals listed

## 2023-06-19 NOTE — ED Provider Notes (Incomplete)
[place] EMERGENCY DEPARTMENT AT [facility] Provider Note   CSN: [id number] Arrival date & time: 06/19/23  2003     History {Add pertinent medical, surgical, social history, OB history to HPI:1} Chief Complaint  Patient presents with  . Emesis  . Constipation    Frank [name] is a 72 y.o. male with multiple complaints, primarily constipation since he had a lumbar fusion 1 week ago under the care of Dr. Lovell Sheehan.  At baseline he has IBS, usually has 2 loose stools daily, but has had no BMs since his surgery except for a small loose stool after given Dulcolax tablets and a fleets enema 3 days ago.  He has developed nausea and vomited multiple times today.  He has had a fever additionally up to 102 3 days ago which was treated with Tylenol, no fever today however.  He denies dysuria, shortness of breath, cough however wife noted he had "crackles" in his lungs yesterday despite using his incentive spirometer postop.  He does endorse pain in his right upper and lower abdomen, denies distention.  He also tripped and fell earlier this week landed on his left upper arm and has pain with attempts at moving the left shoulder.  He has had poor p.o. intake this week but has been hydrating.  He does not have surgical site pain.  The history is provided by the patient.       Home Medications Prior to Admission medications   Medication Sig Start Date End Date Taking? Authorizing Provider  amLODipine (NORVASC) 5 MG tablet Take 5 mg by mouth daily.    [provider]  aspirin EC 81 MG tablet Take 1 tablet (81 mg total) by mouth daily. Swallow whole. 12/13/21   Rehman, Joline Maxcy, MD  chlorthalidone (HYGROTON) 25 MG tablet Take 25 mg by mouth daily. 02/23/19   [provider]  cyclobenzaprine (FLEXERIL) 5 MG tablet Take 1 tablet (5 mg total) by mouth 3 (three) times daily as needed for muscle spasms. 06/12/23   Tressie Stalker, MD  docusate sodium (COLACE) 100 MG capsule Take  1 capsule (100 mg total) by mouth 2 (two) times daily. 06/12/23   Tressie Stalker, MD  ezetimibe (ZETIA) 10 MG tablet Take 10 mg by mouth at bedtime. 08/04/20   [provider]  Ginger, Zingiber officinalis, (GINGER PO) Take 1 capsule by mouth daily.    [provider]  Glucosamine HCl 1000 MG TABS Take 2,000 mg by mouth daily.    [provider]  losartan (COZAAR) 100 MG tablet Take 100 mg by mouth daily.    [provider]  Misc Natural Products (TART CHERRY ADVANCED PO) Take 1 tablet by mouth daily.    [provider]  Multiple Vitamin (MULTIVITAMIN WITH MINERALS) TABS tablet Take 1 tablet by mouth daily.    [provider]  Omega-3 Fatty Acids (FISH OIL) 1000 MG CAPS Take 1,000 mg by mouth daily.    [provider]  omeprazole (PRILOSEC) 20 MG capsule Take 20 mg by mouth daily.  04/04/19   [provider]  oxyCODONE-acetaminophen (PERCOCET/ROXICET) 5-325 MG tablet Take 1-2 tablets by mouth every 4 (four) hours as needed for moderate pain. 06/12/23   Tressie Stalker, MD  pioglitazone (ACTOS) 15 MG tablet Take 15 mg by mouth daily.    [provider]  TURMERIC PO Take 1 capsule by mouth daily.    [provider]      Allergies    Metformin  and Ampicillin    Review of Systems   Review of Systems  Constitutional:  Negative for fever.  HENT:  Negative for congestion and sore throat.   Eyes: Negative.   Respiratory:  Negative for chest tightness and shortness of breath.   Cardiovascular:  Negative for chest pain.  Gastrointestinal:  Positive for abdominal pain, constipation and nausea. Negative for vomiting.  Genitourinary: Negative.   Musculoskeletal:  Negative for arthralgias, joint swelling and neck pain.  Skin: Negative.  Negative for rash and wound.  Neurological:  Positive for weakness. Negative for dizziness, light-headedness, numbness and headaches.  Psychiatric/Behavioral: Negative.       Physical Exam Updated Vital Signs BP 120/79 (BP Location: Right Arm)   Pulse 80   Temp 98.3 F (36.8 C) (Oral)   Resp 17   Ht 5\' 11"  (1.803 m)   Wt 87 kg   SpO2 93%   BMI 26.75 kg/m  Physical Exam  ED Results / Procedures / Treatments   Labs (all labs ordered are listed, but only abnormal results are displayed) Labs Reviewed  LIPASE, BLOOD  COMPREHENSIVE METABOLIC PANEL  CBC  URINALYSIS, ROUTINE W REFLEX MICROSCOPIC    EKG None  Radiology No results found.  Procedures Procedures  {Document cardiac monitor, telemetry assessment procedure when appropriate:1}  Medications Ordered in ED Medications - No data to display  ED Course/ Medical Decision Making/ A&P   {   Click here for ABCD2, HEART and other calculatorsREFRESH Note before signing :1}                          Medical Decision Making Amount and/or Complexity of Data Reviewed Labs: ordered.   ***  {Document critical care time when appropriate:1} {Document review of labs and clinical decision tools ie heart score, Chads2Vasc2 etc:1}  {Document your independent review of radiology images, and any outside records:1} {Document your discussion with family members, caretakers, and with consultants:1} {Document social determinants of health affecting pt's care:1} {Document your decision making why or why not admission, treatments were needed:1} Final Clinical Impression(s) / ED Diagnoses Final diagnoses:  None    Rx / DC Orders ED Discharge Orders     None

## 2023-06-19 NOTE — ED Notes (Signed)
Critical Potassium 2.4 Informed MD

## 2023-06-19 NOTE — ED Triage Notes (Signed)
Pt here for c/o constipation since since having back surgery last Thursday (L4-L5 Lumbar Laminectomy). Wife states pt was doing okay when he got home and then started having constipation on Tues so she tried giving him stool softners, a Fleets Enema, a couple of Dulcolax (had very small loose stool) without much results. States that today, pt is unable to keep anything down and she is worried about a "blockage". Wife also reports that pt had "crackles" in lungs yesterday despite repeated use of Incentive Spirometry post-op. States pt started running a fever with Tmaxx being 102. Last dose of Tylenol was 1000mg  @ 1800 tonight. Wife reports pt fell earlier this week and now c/o pain to L arm and shoulder. Wife states pt will not move arm at shoulder. Wife states pt has rash to inner thighs and to flank bilaterally that radiates to back.

## 2023-06-19 NOTE — ED Notes (Signed)
Introduced self to pt Pt already attached to full monitor and 12 lead completed  Pt stated that he hasn't pooped since back surgery and now feels nauseous and is not keeping things down. Wife stated that pt also has a fever.   IV access established, labs drawn PA at bedside

## 2023-06-19 NOTE — Progress Notes (Signed)
ED Pharmacy Antibiotic Sign Off An antibiotic consult was received from an ED provider for Cefepime per pharmacy dosing for intra-abd infection + Flagyl. A chart review was completed to assess appropriateness.   The following one time order(s) were placed:  Cefepime 2gm IV now  Further antibiotic and/or antibiotic pharmacy consults should be ordered by the admitting provider if indicated.   Thank you for allowing pharmacy to be a part of this patient's care.   Titus Mould, [facility]  Clinical Pharmacist 06/19/23 10:58 PM

## 2023-06-19 NOTE — ED Provider Notes (Addendum)
[place] EMERGENCY DEPARTMENT AT [facility] Provider Note   CSN: [id number] Arrival date & time: 06/19/23  2003     History  Chief Complaint  Patient presents with   Emesis   Constipation    Frank [name] is a 72 y.o. male with multiple complaints, primarily constipation since he had a lumbar fusion 1 week ago under the care of Dr. Lovell Sheehan.  At baseline he has IBS, usually has 2 loose stools daily, but has had no BMs since his surgery except for a small loose stool after given Dulcolax tablets and a fleets enema 3 days ago.  He has developed nausea and vomited multiple times today.  He has had a fever additionally up to 102  three days ago which was treated with Tylenol, no fever today however.  He denies dysuria, shortness of breath, cough however wife noted he had "crackles" in his lungs yesterday despite using his incentive spirometer postop.  He does endorse pain in his right upper and lower abdomen, denies distention.  He also tripped and fell earlier this week landed on his left upper arm and has pain with attempts at moving the left shoulder.  He has had poor p.o. intake this week but has been hydrating.  He does not have surgical site pain.  The history is provided by the patient.       Home Medications Prior to Admission medications   Medication Sig Start Date End Date Taking? Authorizing Provider  amLODipine (NORVASC) 5 MG tablet Take 5 mg by mouth daily.    [provider]  aspirin EC 81 MG tablet Take 1 tablet (81 mg total) by mouth daily. Swallow whole. 12/13/21   Rehman, Joline Maxcy, MD  chlorthalidone (HYGROTON) 25 MG tablet Take 25 mg by mouth daily. 02/23/19   [provider]  cyclobenzaprine (FLEXERIL) 5 MG tablet Take 1 tablet (5 mg total) by mouth 3 (three) times daily as needed for muscle spasms. 06/12/23   Tressie Stalker, MD  docusate sodium (COLACE) 100 MG capsule Take 1 capsule (100 mg total) by mouth 2 (two) times daily. 06/12/23    Tressie Stalker, MD  ezetimibe (ZETIA) 10 MG tablet Take 10 mg by mouth at bedtime. 08/04/20   [provider]  Ginger, Zingiber officinalis, (GINGER PO) Take 1 capsule by mouth daily.    [provider]  Glucosamine HCl 1000 MG TABS Take 2,000 mg by mouth daily.    [provider]  losartan (COZAAR) 100 MG tablet Take 100 mg by mouth daily.    [provider]  Misc Natural Products (TART CHERRY ADVANCED PO) Take 1 tablet by mouth daily.    [provider]  Multiple Vitamin (MULTIVITAMIN WITH MINERALS) TABS tablet Take 1 tablet by mouth daily.    [provider]  Omega-3 Fatty Acids (FISH OIL) 1000 MG CAPS Take 1,000 mg by mouth daily.    [provider]  omeprazole (PRILOSEC) 20 MG capsule Take 20 mg by mouth daily.  04/04/19   [provider]  oxyCODONE-acetaminophen (PERCOCET/ROXICET) 5-325 MG tablet Take 1-2 tablets by mouth every 4 (four) hours as needed for moderate pain. 06/12/23   Tressie Stalker, MD  pioglitazone (ACTOS) 15 MG tablet Take 15 mg by mouth daily.    [provider]  TURMERIC PO Take 1 capsule by mouth daily.    [provider]      Allergies    Metformin and Ampicillin    Review of Systems  Review of Systems  Constitutional:  Negative for fever.  HENT:  Negative for congestion and sore throat.   Eyes: Negative.   Respiratory:  Negative for chest tightness and shortness of breath.   Cardiovascular:  Negative for chest pain.  Gastrointestinal:  Positive for abdominal pain, constipation and nausea. Negative for vomiting.  Genitourinary: Negative.   Musculoskeletal:  Negative for arthralgias, joint swelling and neck pain.  Skin: Negative.  Negative for rash and wound.  Neurological:  Positive for weakness. Negative for dizziness, light-headedness, numbness and headaches.  Psychiatric/Behavioral: Negative.      Physical Exam Updated Vital Signs BP 116/81   Pulse 76   Temp 98  F (36.7 C) (Oral)   Resp 11   Ht 5\' 11"  (1.803 m)   Wt 87 kg   SpO2 91%   BMI 26.75 kg/m  Physical Exam Vitals and nursing note reviewed.  Constitutional:      Appearance: He is well-developed.  HENT:     Head: Normocephalic and atraumatic.  Eyes:     Conjunctiva/sclera: Conjunctivae normal.  Cardiovascular:     Rate and Rhythm: Normal rate and regular rhythm.     Heart sounds: Normal heart sounds.  Pulmonary:     Effort: Pulmonary effort is normal.     Breath sounds: Normal breath sounds. No wheezing.  Abdominal:     General: Bowel sounds are normal. There is distension.     Palpations: Abdomen is soft.     Tenderness: There is abdominal tenderness. There is no guarding or rebound.  Musculoskeletal:        General: Normal range of motion.     Cervical back: Normal range of motion.     Comments: Well healing surgical incision midline lumbar  Skin:    General: Skin is warm and dry.  Neurological:     General: No focal deficit present.     Mental Status: He is alert.     ED Results / Procedures / Treatments   Labs (all labs ordered are listed, but only abnormal results are displayed) Labs Reviewed  COMPREHENSIVE METABOLIC PANEL - Abnormal; Notable for the following components:      Result Value   Sodium 131 (*)    Potassium 2.4 (*)    Chloride 84 (*)    Glucose, Bld 162 (*)    BUN 42 (*)    Creatinine, Ser 1.62 (*)    Albumin 3.1 (*)    Total Bilirubin 1.7 (*)    GFR, Estimated 45 (*)    All other components within normal limits  CBC - Abnormal; Notable for the following components:   WBC 12.9 (*)    RBC 3.98 (*)    Hemoglobin 11.8 (*)    HCT 34.1 (*)    Platelets 440 (*)    All other components within normal limits  URINALYSIS, ROUTINE W REFLEX MICROSCOPIC - Abnormal; Notable for the following components:   APPearance HAZY (*)    Hgb urine dipstick MODERATE (*)    Protein, ur 30 (*)    All other components within normal limits  LIPASE, BLOOD  LACTIC  ACID, PLASMA  MAGNESIUM  BASIC METABOLIC PANEL    EKG None  Radiology DG ABD ACUTE 2+V W 1V CHEST  Addendum Date: 06/19/2023   ADDENDUM REPORT: 06/19/2023 23:52 ADDENDUM: These results were called by telephone at the time of interpretation on 06/19/2023 at 10:56 p.m. to provider Dr. Audrie Lia, who verbally acknowledged these results. Electronically Signed   By: Linton Rump  Malena Edman M.D.   On: 06/19/2023 23:52   Result Date: 06/19/2023 CLINICAL DATA:  Postoperative fever EXAM: DG ABDOMEN ACUTE WITH 1 VIEW CHEST COMPARISON:  CT abdomen and pelvis 09/05/2020 FINDINGS: The lungs are clear.  There is no pleural effusion or pneumothorax. There is a small amount of free air under the right hemidiaphragm. There are dilated small bowel loops in the central abdomen measuring up to 6 cm. The colon appears nondilated. There are no suspicious calcifications identified. Lower lumbar fusion hardware is unremarkable. No acute fractures are seen. IMPRESSION: 1. Small amount of free air under the right hemidiaphragm. 2. Dilated small bowel loops in the central abdomen measuring up to 6 cm. Findings are concerning for small bowel obstruction. 3. No acute cardiopulmonary process. Electronically Signed: By: Darliss Cheney M.D. On: 06/19/2023 21:49   CT ABDOMEN PELVIS W CONTRAST  Result Date: 06/19/2023 CLINICAL DATA:  Constipation, emesis. Bowel obstruction suspected. Back surgery last Thursday, July 11th. EXAM: CT ABDOMEN AND PELVIS WITH CONTRAST TECHNIQUE: Multidetector CT imaging of the abdomen and pelvis was performed using the standard protocol following bolus administration of intravenous contrast. RADIATION DOSE REDUCTION: This exam was performed according to the departmental dose-optimization program which includes automated exposure control, adjustment of the mA and/or kV according to patient size and/or use of iterative reconstruction technique. CONTRAST:  80mL OMNIPAQUE IOHEXOL 300 MG/ML  SOLN COMPARISON:  09/05/2020.  FINDINGS: Lower chest: Heart is normal in size and there is no pericardial effusion. Scattered coronary artery calcifications are noted. Mild atelectasis is present at the lung bases. Hepatobiliary: A cyst is present in the left lobe of the liver. No biliary ductal dilatation. Gallbladder is without stones. Pancreas: Unremarkable. No pancreatic ductal dilatation or surrounding inflammatory changes. Spleen: Normal in size without focal abnormality. Adrenals/Urinary Tract: No adrenal nodule or mass. The kidneys enhance symmetrically. Subcentimeter hypodensities are present bilaterally which are too small to further characterize. Nonobstructive renal calculi are noted bilaterally. No ureteral calculus or obstructive uropathy. There is diffuse bladder wall thickening. Stomach/Bowel: The stomach is within normal limits. Multiple distended loops of small bowel are noted in the abdomen with air-fluid levels measuring up to 3.7 cm. A transition point is noted in the right lower quadrant with decompression of the distal small bowel, axial image 79. Multiple diverticula are present along the colon. There is bowel wall thickening at the sigmoid colon with mild surrounding inflammatory changes in the pelvis. The appendix appears normal. Vascular/Lymphatic: Aortic atherosclerosis. No enlarged abdominal or pelvic lymph nodes. Reproductive: Prostate gland is mildly enlarged. Other: Small amount of free fluid with foci of air are noted in the pelvis. There is a loculated fluid collection with air in the right lower quadrant measuring 4.0 x 2.3 cm, adjacent to the region of bowel obstruction. A moderate amount of free air is noted in the abdomen and in a fat containing umbilical hernia. Musculoskeletal: Anterior and posterior lumbar spinal fusion hardware is noted from L4-L5. Degenerative changes are noted in the thoracolumbar spine. IMPRESSION: 1. Status post anterior and posterior lumbar spinal fusion hardware from L4-L5. 2.  Moderate amount of free air in the abdomen and pelvis, more than anticipated for postoperative time. Findings are concerning for possible bowel perforation. Surgical consultation is recommended. 3. Focal fluid collections in the right lower quadrant and pelvis containing air, concerning for developing abscess. Dilated loops of small bowel in the abdomen and pelvis with a transition point in the right lower quadrant adjacent to a fluid collection and decompression of  the small bowel distal to this point, concerning for small bowel obstruction. 4. Colonic diverticulosis with bowel wall thickening and fat stranding at the sigmoid colon, possible diverticulitis versus local inflammatory changes. 5. Bladder wall thickening, suggesting infectious or inflammatory cystitis. 6. Bilateral nephrolithiasis. 7. Coronary artery calcifications and aortic atherosclerosis. 8. Remaining incidental findings as described above. Critical Value/emergent results were called by telephone at the time of interpretation on 06/19/2023 at 10:55 pm to provider Dr. Audrie Lia, who verbally acknowledged these results. Electronically Signed   By: Thornell Sartorius M.D.   On: 06/19/2023 22:56   DG Shoulder Left  Result Date: 06/19/2023 CLINICAL DATA:  Postoperative fever and constipation. Shoulder pain after recent fall. Stomach pain, vomiting, and nausea. EXAM: LEFT SHOULDER - 2+ VIEW COMPARISON:  None Available. FINDINGS: Degenerative changes in the glenohumeral joint with mild glenoid osteophyte formation. No evidence of acute fracture or dislocation. No focal bone lesion or bone destruction. Soft tissues are unremarkable. IMPRESSION: Mild degenerative changes in the glenohumeral joint. No acute bony abnormalities. Electronically Signed   By: Burman Nieves M.D.   On: 06/19/2023 21:49    Procedures Procedures    Medications Ordered in ED Medications  potassium chloride 10 MEQ/100ML IVPB (  Canceled Entry 06/19/23 2354)  famotidine (PEPCID)  IVPB 20 mg premix (20 mg Intravenous New Bag/Given 06/20/23 0010)  potassium chloride 10 mEq in 100 mL IVPB (has no administration in time range)  ondansetron (ZOFRAN) injection 4 mg (4 mg Intravenous Given 06/19/23 2111)  lactated ringers bolus 1,000 mL (1,000 mLs Intravenous New Bag/Given 06/19/23 2143)  fentaNYL (SUBLIMAZE) injection 50 mcg (50 mcg Intravenous Given 06/19/23 2205)  iohexol (OMNIPAQUE) 300 MG/ML solution 80 mL (80 mLs Intravenous Contrast Given 06/19/23 2216)  metroNIDAZOLE (FLAGYL) IVPB 500 mg (500 mg Intravenous New Bag/Given 06/19/23 2309)  ceFEPIme (MAXIPIME) 2 g in sodium chloride 0.9 % 100 mL IVPB (0 g Intravenous Stopped 06/19/23 2341)    ED Course/ Medical Decision Making/ A&P                             Medical Decision Making Patient presenting with constipation, abdominal distention and mild abdominal pain this week, day 8 from posterior approach lumbar fusion surgery.  Other multiple complaints including left shoulder pain after falling earlier this week, had an isolated fever of 102 3 days ago, none since.  Cough without shortness of breath, no chest pain.  Postsurgical complications could include pneumonia, UTI, abdominal pain and constipation includes a broad differential including small bowel obstruction, diverticulitis, postsurgical ileus  Amount and/or Complexity of Data Reviewed Labs: ordered.    Details: Labs reviewed, his c-Met reveals a sodium of 131, he has a significant hypokalemia at 2.4, he is dehydrated with a BUN of 42 and a creatinine of 1.62, he has a WBC count of 12.9, normocytic anemia with a hemoglobin of 11.8. Radiology: ordered.    Details: Acute abdominal series negative for pneumonia, positive for possible small bowel obstruction and free air.  We proceeded to CT abdomen and pelvis confirming a moderate amount of free air, small bowel obstruction, fluid collections in the right lower quadrant concerning for developing abscess formation,  diverticulosis with bowel wall thickening of the sigmoid colon with possible diverticulitis Discussion of management or test interpretation with external provider(s): Patient discussed with Dr. [name] of general surgery, he has requested antibiotics including cefepime and Flagyl, n.p.o., NG tube.  He will plan to see patient  in the morning for surgery.  He has asked for hospital admission.  Given patient is 1 week out from lumbar laminectomy, he asked for [place] to also reach out to neurosurgery to inquire whether they would want this patient admitted at [facility] so that he can follow him from a postop neurosurgery standpoint.  I spoke with Dr. Wynetta Emery who did not feel they needed to be involved in his care at this time.  Call placed to the hospitalist for admission. Discussed with Dr. Carren Rang who accepts pt for admission.  Risk Prescription drug management. Decision regarding hospitalization.           Final Clinical Impression(s) / ED Diagnoses Final diagnoses:  SBO (small bowel obstruction) (HCC)  Perforated abdominal viscus  Hypokalemia    Rx / DC Orders ED Discharge Orders     None         Victoriano Lain 06/20/23 0051    Burgess Amor, PA-C 06/20/23 0109    Kommor, Wyn Forster, MD 06/20/23 1301

## 2023-06-19 NOTE — ED Notes (Signed)
Medicated per [facility] Urinal left at bedside Pt off to XRAY

## 2023-06-19 NOTE — ED Notes (Signed)
Pastor at bedside requested ice pack for pt's wife Pt still at [employer] ice pack to bedside Wife holding LEFT side of neck Informed wife that if she wanted any further treatment she would have to check in as a pt.   Wife understanding, pastor upset with this [employer] fund[employer]. Insisted that this RN refused medical care.

## 2023-06-20 ENCOUNTER — Encounter (HOSPITAL_COMMUNITY)
Admission: EM | Disposition: A | Discharge status: Home / Self Care | Payer: Self-pay | Source: Home / Self Care | Attending: General Surgery

## 2023-06-20 ENCOUNTER — Inpatient Hospital Stay (HOSPITAL_COMMUNITY): Payer: PPO | Admitting: Anesthesiology

## 2023-06-20 ENCOUNTER — Inpatient Hospital Stay (HOSPITAL_COMMUNITY): Payer: PPO

## 2023-06-20 DIAGNOSIS — R21 Rash and other nonspecific skin eruption: Secondary | ICD-10-CM | POA: Diagnosis not present

## 2023-06-20 DIAGNOSIS — K5732 Diverticulitis of large intestine without perforation or abscess without bleeding: Secondary | ICD-10-CM | POA: Diagnosis not present

## 2023-06-20 DIAGNOSIS — I10 Essential (primary) hypertension: Secondary | ICD-10-CM

## 2023-06-20 DIAGNOSIS — I129 Hypertensive chronic kidney disease with stage 1 through stage 4 chronic kidney disease, or unspecified chronic kidney disease: Secondary | ICD-10-CM | POA: Diagnosis not present

## 2023-06-20 DIAGNOSIS — K5720 Diverticulitis of large intestine with perforation and abscess without bleeding: Secondary | ICD-10-CM | POA: Diagnosis present

## 2023-06-20 DIAGNOSIS — K5669 Other partial intestinal obstruction: Secondary | ICD-10-CM | POA: Diagnosis not present

## 2023-06-20 DIAGNOSIS — K651 Peritoneal abscess: Secondary | ICD-10-CM | POA: Diagnosis not present

## 2023-06-20 DIAGNOSIS — N179 Acute kidney failure, unspecified: Secondary | ICD-10-CM | POA: Diagnosis not present

## 2023-06-20 DIAGNOSIS — Z801 Family history of malignant neoplasm of trachea, bronchus and lung: Secondary | ICD-10-CM | POA: Diagnosis not present

## 2023-06-20 DIAGNOSIS — N189 Chronic kidney disease, unspecified: Secondary | ICD-10-CM | POA: Diagnosis not present

## 2023-06-20 DIAGNOSIS — K56609 Unspecified intestinal obstruction, unspecified as to partial versus complete obstruction: Secondary | ICD-10-CM | POA: Diagnosis not present

## 2023-06-20 DIAGNOSIS — E876 Hypokalemia: Secondary | ICD-10-CM | POA: Diagnosis not present

## 2023-06-20 DIAGNOSIS — R7303 Prediabetes: Secondary | ICD-10-CM | POA: Diagnosis not present

## 2023-06-20 DIAGNOSIS — E878 Other disorders of electrolyte and fluid balance, not elsewhere classified: Secondary | ICD-10-CM | POA: Diagnosis not present

## 2023-06-20 DIAGNOSIS — R001 Bradycardia, unspecified: Secondary | ICD-10-CM | POA: Diagnosis not present

## 2023-06-20 DIAGNOSIS — E7800 Pure hypercholesterolemia, unspecified: Secondary | ICD-10-CM | POA: Diagnosis not present

## 2023-06-20 DIAGNOSIS — K219 Gastro-esophageal reflux disease without esophagitis: Secondary | ICD-10-CM

## 2023-06-20 DIAGNOSIS — Z888 Allergy status to other drugs, medicaments and biological substances status: Secondary | ICD-10-CM | POA: Diagnosis not present

## 2023-06-20 DIAGNOSIS — R198 Other specified symptoms and signs involving the digestive system and abdomen: Secondary | ICD-10-CM

## 2023-06-20 DIAGNOSIS — B952 Enterococcus as the cause of diseases classified elsewhere: Secondary | ICD-10-CM | POA: Diagnosis not present

## 2023-06-20 DIAGNOSIS — Z7982 Long term (current) use of aspirin: Secondary | ICD-10-CM | POA: Diagnosis not present

## 2023-06-20 DIAGNOSIS — N400 Enlarged prostate without lower urinary tract symptoms: Secondary | ICD-10-CM | POA: Diagnosis not present

## 2023-06-20 DIAGNOSIS — E871 Hypo-osmolality and hyponatremia: Secondary | ICD-10-CM | POA: Diagnosis not present

## 2023-06-20 DIAGNOSIS — Z79899 Other long term (current) drug therapy: Secondary | ICD-10-CM | POA: Diagnosis not present

## 2023-06-20 DIAGNOSIS — Z8042 Family history of malignant neoplasm of prostate: Secondary | ICD-10-CM | POA: Diagnosis not present

## 2023-06-20 DIAGNOSIS — B9620 Unspecified Escherichia coli [E. coli] as the cause of diseases classified elsewhere: Secondary | ICD-10-CM | POA: Diagnosis not present

## 2023-06-20 DIAGNOSIS — Z8249 Family history of ischemic heart disease and other diseases of the circulatory system: Secondary | ICD-10-CM | POA: Diagnosis not present

## 2023-06-20 DIAGNOSIS — Z4682 Encounter for fitting and adjustment of non-vascular catheter: Secondary | ICD-10-CM | POA: Diagnosis not present

## 2023-06-20 DIAGNOSIS — Z7984 Long term (current) use of oral hypoglycemic drugs: Secondary | ICD-10-CM | POA: Diagnosis not present

## 2023-06-20 DIAGNOSIS — Z825 Family history of asthma and other chronic lower respiratory diseases: Secondary | ICD-10-CM | POA: Diagnosis not present

## 2023-06-20 DIAGNOSIS — Z981 Arthrodesis status: Secondary | ICD-10-CM | POA: Diagnosis not present

## 2023-06-20 HISTORY — PX: LAPAROTOMY: SHX154

## 2023-06-20 LAB — CBC WITH DIFFERENTIAL/PLATELET
Abs Immature Granulocytes: 0.07 10*3/uL (ref 0.00–0.07)
Basophils Absolute: 0 10*3/uL (ref 0.0–0.1)
Basophils Relative: 0 %
Eosinophils Absolute: 0.2 10*3/uL (ref 0.0–0.5)
Eosinophils Relative: 2 %
HCT: 31.3 % — ABNORMAL LOW (ref 39.0–52.0)
Hemoglobin: 10.5 g/dL — ABNORMAL LOW (ref 13.0–17.0)
Immature Granulocytes: 1 %
Lymphocytes Relative: 6 %
Lymphs Abs: 0.7 10*3/uL (ref 0.7–4.0)
MCH: 29.5 pg (ref 26.0–34.0)
MCHC: 33.5 g/dL (ref 30.0–36.0)
MCV: 87.9 fL (ref 80.0–100.0)
Monocytes Absolute: 0.8 10*3/uL (ref 0.1–1.0)
Monocytes Relative: 6 %
Neutro Abs: 10.3 10*3/uL — ABNORMAL HIGH (ref 1.7–7.7)
Neutrophils Relative %: 85 %
Platelets: 408 10*3/uL — ABNORMAL HIGH (ref 150–400)
RBC: 3.56 MIL/uL — ABNORMAL LOW (ref 4.22–5.81)
RDW: 13.5 % (ref 11.5–15.5)
WBC: 12.1 10*3/uL — ABNORMAL HIGH (ref 4.0–10.5)
nRBC: 0 % (ref 0.0–0.2)

## 2023-06-20 LAB — BASIC METABOLIC PANEL
Anion gap: 12 (ref 5–15)
BUN: 31 mg/dL — ABNORMAL HIGH (ref 8–23)
CO2: 28 mmol/L (ref 22–32)
Calcium: 8.6 mg/dL — ABNORMAL LOW (ref 8.9–10.3)
Chloride: 94 mmol/L — ABNORMAL LOW (ref 98–111)
Creatinine, Ser: 1.16 mg/dL (ref 0.61–1.24)
GFR, Estimated: >60 mL/min (ref >60)
Glucose, Bld: 205 mg/dL — ABNORMAL HIGH (ref 70–99)
Potassium: 3.5 mmol/L (ref 3.5–5.1)
Sodium: 134 mmol/L — ABNORMAL LOW (ref 135–145)

## 2023-06-20 LAB — COMPREHENSIVE METABOLIC PANEL
ALT: 25 U/L (ref 0–44)
AST: 26 U/L (ref 15–41)
Albumin: 2.7 g/dL — ABNORMAL LOW (ref 3.5–5.0)
Alkaline Phosphatase: 52 U/L (ref 38–126)
Anion gap: 10 (ref 5–15)
BUN: 38 mg/dL — ABNORMAL HIGH (ref 8–23)
CO2: 33 mmol/L — ABNORMAL HIGH (ref 22–32)
Calcium: 8.9 mg/dL (ref 8.9–10.3)
Chloride: 88 mmol/L — ABNORMAL LOW (ref 98–111)
Creatinine, Ser: 1.37 mg/dL — ABNORMAL HIGH (ref 0.61–1.24)
GFR, Estimated: 54 mL/min — ABNORMAL LOW (ref >60)
Glucose, Bld: 136 mg/dL — ABNORMAL HIGH (ref 70–99)
Potassium: 2.7 mmol/L — CL (ref 3.5–5.1)
Sodium: 131 mmol/L — ABNORMAL LOW (ref 135–145)
Total Bilirubin: 1 mg/dL (ref 0.3–1.2)
Total Protein: 6.5 g/dL (ref 6.5–8.1)

## 2023-06-20 LAB — MAGNESIUM: Magnesium: 2 mg/dL (ref 1.7–2.4)

## 2023-06-20 LAB — POTASSIUM: Potassium: 2.7 mmol/L — CL (ref 3.5–5.1)

## 2023-06-20 SURGERY — LAPAROTOMY, EXPLORATORY
Anesthesia: General

## 2023-06-20 MED ORDER — DEXAMETHASONE SODIUM PHOSPHATE 10 MG/ML IJ SOLN
INTRAMUSCULAR | Status: AC
  Filled 2023-06-20: qty 1

## 2023-06-20 MED ORDER — ENOXAPARIN SODIUM 40 MG/0.4ML IJ SOSY
40.0000 mg | PREFILLED_SYRINGE | INTRAMUSCULAR | Status: DC
  Administered 2023-06-21 – 2023-06-24 (×4): 40 mg via SUBCUTANEOUS
  Filled 2023-06-20 (×4): qty 0.4

## 2023-06-20 MED ORDER — PHENOL 1.4 % MT LIQD
1.0000 | OROMUCOSAL | Status: DC | PRN
  Administered 2023-06-20: 1 via OROMUCOSAL
  Filled 2023-06-20: qty 177

## 2023-06-20 MED ORDER — MAGNESIUM SULFATE 2 GM/50ML IV SOLN
2.0000 g | Freq: Once | INTRAVENOUS | Status: AC
  Administered 2023-06-20: 2 g via INTRAVENOUS
  Filled 2023-06-20: qty 50

## 2023-06-20 MED ORDER — LACTATED RINGERS IV BOLUS: 500.0000 mL | Freq: Once | INTRAVENOUS | Status: DC

## 2023-06-20 MED ORDER — ROCURONIUM BROMIDE 10 MG/ML (PF) SYRINGE
PREFILLED_SYRINGE | INTRAVENOUS | Status: AC
  Filled 2023-06-20: qty 10

## 2023-06-20 MED ORDER — POTASSIUM CHLORIDE 10 MEQ/100ML IV SOLN
10.0000 meq | INTRAVENOUS | Status: DC
  Administered 2023-06-20 (×5): 10 meq via INTRAVENOUS
  Filled 2023-06-20 (×6): qty 100

## 2023-06-20 MED ORDER — PROPOFOL 10 MG/ML IV BOLUS
INTRAVENOUS | Status: DC | PRN
Start: 2023-06-20 — End: 2023-06-20
  Administered 2023-06-20: 160 mg via INTRAVENOUS

## 2023-06-20 MED ORDER — METHYLPREDNISOLONE SODIUM SUCC 125 MG IJ SOLR
125.0000 mg | Freq: Once | INTRAMUSCULAR | Status: AC
  Administered 2023-06-20: 125 mg via INTRAVENOUS
  Filled 2023-06-20: qty 2

## 2023-06-20 MED ORDER — LIDOCAINE HCL (CARDIAC) PF 100 MG/5ML IV SOSY
PREFILLED_SYRINGE | INTRAVENOUS | Status: DC | PRN
  Administered 2023-06-20: 100 mg via INTRATRACHEAL

## 2023-06-20 MED ORDER — FENTANYL CITRATE (PF) 100 MCG/2ML IJ SOLN
INTRAMUSCULAR | Status: DC | PRN
  Administered 2023-06-20 (×5): 50 ug via INTRAVENOUS

## 2023-06-20 MED ORDER — LACTATED RINGERS IV SOLN: INTRAVENOUS | Status: DC | PRN

## 2023-06-20 MED ORDER — ONDANSETRON HCL 4 MG/2ML IJ SOLN: 4.0000 mg | Freq: Four times a day (QID) | INTRAMUSCULAR | Status: DC | PRN

## 2023-06-20 MED ORDER — BUPIVACAINE HCL (PF) 0.5 % IJ SOLN
INTRAMUSCULAR | Status: DC | PRN
  Administered 2023-06-20: 30 mL

## 2023-06-20 MED ORDER — MORPHINE SULFATE (PF) 2 MG/ML IV SOLN: 2.0000 mg | INTRAVENOUS | Status: DC | PRN

## 2023-06-20 MED ORDER — ONDANSETRON HCL 4 MG PO TABS: 4.0000 mg | ORAL_TABLET | Freq: Four times a day (QID) | ORAL | Status: DC | PRN

## 2023-06-20 MED ORDER — ACETAMINOPHEN 325 MG PO TABS: 650.0000 mg | ORAL_TABLET | Freq: Four times a day (QID) | ORAL | Status: DC | PRN

## 2023-06-20 MED ORDER — FENTANYL CITRATE (PF) 250 MCG/5ML IJ SOLN
INTRAMUSCULAR | Status: AC
  Filled 2023-06-20: qty 5

## 2023-06-20 MED ORDER — ONDANSETRON HCL 4 MG/2ML IJ SOLN
INTRAMUSCULAR | Status: DC | PRN
  Administered 2023-06-20: 4 mg via INTRAVENOUS

## 2023-06-20 MED ORDER — POTASSIUM CHLORIDE 10 MEQ/100ML IV SOLN
10.0000 meq | INTRAVENOUS | Status: DC
  Administered 2023-06-20 (×2): 10 meq via INTRAVENOUS
  Filled 2023-06-20 (×3): qty 100

## 2023-06-20 MED ORDER — ACETAMINOPHEN 650 MG RE SUPP: 650.0000 mg | Freq: Four times a day (QID) | RECTAL | Status: DC | PRN

## 2023-06-20 MED ORDER — SODIUM CHLORIDE 0.9 % IV SOLN: INTRAVENOUS | Status: DC

## 2023-06-20 MED ORDER — PROPOFOL 10 MG/ML IV BOLUS
INTRAVENOUS | Status: AC
  Filled 2023-06-20: qty 20

## 2023-06-20 MED ORDER — POTASSIUM CHLORIDE 2 MEQ/ML IV SOLN
INTRAVENOUS | Status: AC
  Filled 2023-06-20 (×2): qty 1000

## 2023-06-20 MED ORDER — ONDANSETRON HCL 4 MG/2ML IJ SOLN
INTRAMUSCULAR | Status: AC
  Filled 2023-06-20: qty 2

## 2023-06-20 MED ORDER — PHENYLEPHRINE HCL-NACL 20-0.9 MG/250ML-% IV SOLN
INTRAVENOUS | Status: DC | PRN
  Administered 2023-06-20: 20 ug/min via INTRAVENOUS

## 2023-06-20 MED ORDER — SODIUM CHLORIDE 0.9 % IR SOLN
Status: DC | PRN
  Administered 2023-06-20: 1000 mL

## 2023-06-20 MED ORDER — SUCCINYLCHOLINE CHLORIDE 200 MG/10ML IV SOSY
PREFILLED_SYRINGE | INTRAVENOUS | Status: AC
  Filled 2023-06-20: qty 10

## 2023-06-20 MED ORDER — PANTOPRAZOLE SODIUM 40 MG IV SOLR
40.0000 mg | INTRAVENOUS | Status: DC
  Administered 2023-06-20 – 2023-06-21 (×2): 40 mg via INTRAVENOUS
  Filled 2023-06-20 (×2): qty 10

## 2023-06-20 MED ORDER — SUGAMMADEX SODIUM 200 MG/2ML IV SOLN
INTRAVENOUS | Status: DC | PRN
  Administered 2023-06-20: 200 mg via INTRAVENOUS

## 2023-06-20 MED ORDER — PHENYLEPHRINE HCL-NACL 20-0.9 MG/250ML-% IV SOLN
INTRAVENOUS | Status: AC
  Filled 2023-06-20: qty 250

## 2023-06-20 MED ORDER — METRONIDAZOLE 500 MG/100ML IV SOLN
500.0000 mg | Freq: Two times a day (BID) | INTRAVENOUS | Status: DC
  Administered 2023-06-20 – 2023-06-23 (×6): 500 mg via INTRAVENOUS
  Filled 2023-06-20 (×6): qty 100

## 2023-06-20 MED ORDER — LIDOCAINE HCL (PF) 2 % IJ SOLN
INTRAMUSCULAR | Status: AC
  Filled 2023-06-20: qty 5

## 2023-06-20 MED ORDER — HYDROMORPHONE HCL 1 MG/ML IJ SOLN
1.0000 mg | INTRAMUSCULAR | Status: DC | PRN
  Administered 2023-06-20 – 2023-06-21 (×5): 1 mg via INTRAVENOUS
  Filled 2023-06-20 (×5): qty 1

## 2023-06-20 MED ORDER — POTASSIUM CHLORIDE 10 MEQ/100ML IV SOLN
10.0000 meq | Freq: Once | INTRAVENOUS | Status: AC
  Administered 2023-06-20: 10 meq via INTRAVENOUS

## 2023-06-20 MED ORDER — DEXAMETHASONE SODIUM PHOSPHATE 10 MG/ML IJ SOLN
INTRAMUSCULAR | Status: DC | PRN
  Administered 2023-06-20: 5 mg via INTRAVENOUS

## 2023-06-20 MED ORDER — SODIUM CHLORIDE 0.9 % IV SOLN
2.0000 g | INTRAVENOUS | Status: DC
  Administered 2023-06-20 – 2023-06-23 (×4): 2 g via INTRAVENOUS
  Filled 2023-06-20 (×4): qty 20

## 2023-06-20 MED ORDER — DIPHENHYDRAMINE HCL 50 MG/ML IJ SOLN
25.0000 mg | Freq: Once | INTRAMUSCULAR | Status: AC
  Administered 2023-06-20: 25 mg via INTRAVENOUS
  Filled 2023-06-20: qty 1

## 2023-06-20 MED ORDER — SUCCINYLCHOLINE CHLORIDE 200 MG/10ML IV SOSY
PREFILLED_SYRINGE | INTRAVENOUS | Status: DC | PRN
  Administered 2023-06-20: 140 mg via INTRAVENOUS

## 2023-06-20 MED ORDER — ROCURONIUM BROMIDE 10 MG/ML (PF) SYRINGE
PREFILLED_SYRINGE | INTRAVENOUS | Status: DC | PRN
  Administered 2023-06-20: 60 mg via INTRAVENOUS

## 2023-06-20 MED ORDER — HYDROMORPHONE HCL 1 MG/ML IJ SOLN: 0.2500 mg | INTRAMUSCULAR | Status: DC | PRN

## 2023-06-20 SURGICAL SUPPLY — 39 items
CLOTH BEACON ORANGE TIMEOUT ST (SAFETY) ×1 IMPLANT
COVER LIGHT HANDLE STERIS (MISCELLANEOUS) ×2 IMPLANT
DRAPE WARM FLUID 44X44 (DRAPES) ×1 IMPLANT
DRSG OPSITE POSTOP 4X10 (GAUZE/BANDAGES/DRESSINGS) IMPLANT
ELECT REM PT RETURN 9FT ADLT (ELECTROSURGICAL) ×1
ELECTRODE REM PT RTRN 9FT ADLT (ELECTROSURGICAL) ×1 IMPLANT
EVACUATOR DRAINAGE 10X20 100CC (DRAIN) IMPLANT
EVACUATOR SILICONE 100CC (DRAIN) ×1
GLOVE BIOGEL PI IND STRL 7.0 (GLOVE) ×2 IMPLANT
GLOVE SURG SS PI 7.5 STRL IVOR (GLOVE) ×2 IMPLANT
GOWN STRL REUS W/TWL LRG LVL3 (GOWN DISPOSABLE) ×3 IMPLANT
HANDLE SUCTION POOLE (INSTRUMENTS) IMPLANT
INST SET MAJOR GENERAL (KITS) ×1 IMPLANT
KIT TURNOVER KIT A (KITS) ×1 IMPLANT
LIGASURE IMPACT 36 18CM CVD LR (INSTRUMENTS) ×1 IMPLANT
MANIFOLD NEPTUNE II (INSTRUMENTS) ×1 IMPLANT
NDL HYPO 18GX1.5 BLUNT FILL (NEEDLE) ×1 IMPLANT
NDL HYPO 21X1.5 SAFETY (NEEDLE) ×1 IMPLANT
NEEDLE HYPO 18GX1.5 BLUNT FILL (NEEDLE) ×1 IMPLANT
NEEDLE HYPO 21X1.5 SAFETY (NEEDLE) ×1 IMPLANT
NS IRRIG 1000ML POUR BTL (IV SOLUTION) ×2 IMPLANT
PACK ABDOMINAL MAJOR (CUSTOM PROCEDURE TRAY) ×1 IMPLANT
PAD ARMBOARD 7.5X6 YLW CONV (MISCELLANEOUS) ×1 IMPLANT
PENCIL SMOKE EVACUATOR (MISCELLANEOUS) ×1 IMPLANT
POSITIONER HEAD 8X9X4 ADT (SOFTGOODS) ×1 IMPLANT
RETRACTOR WND ALEXIS-O 25 LRG (MISCELLANEOUS) IMPLANT
RETRACTOR WOUND ALXS 25CM LRG (MISCELLANEOUS) ×1 IMPLANT
RTRCTR WOUND ALEXIS O 25CM LRG (MISCELLANEOUS) ×1
SET BASIN LINEN APH (SET/KITS/TRAYS/PACK) ×1 IMPLANT
SPONGE DRAIN TRACH 4X4 STRL 2S (GAUZE/BANDAGES/DRESSINGS) IMPLANT
SPONGE T-LAP 18X18 ~~LOC~~+RFID (SPONGE) ×1 IMPLANT
STAPLER VISISTAT (STAPLE) ×1 IMPLANT
SUCTION POOLE HANDLE (INSTRUMENTS) ×1
SUT ETHILON 3 0 FSL (SUTURE) IMPLANT
SUT PDS AB 0 CTX 60 (SUTURE) IMPLANT
SWAB CULTURE ESWAB REG 1ML (MISCELLANEOUS) IMPLANT
SYR 30ML LL (SYRINGE) ×2 IMPLANT
TAPE HYPAFIX 6X30 (GAUZE/BANDAGES/DRESSINGS) IMPLANT
TRAY FOLEY MTR SLVR 16FR STAT (SET/KITS/TRAYS/PACK) ×1 IMPLANT

## 2023-06-20 NOTE — H&P (Signed)
History and Physical    Patient: Frank [name] NWG:[id number] DOB: 07-21-1951 DOA: 06/19/2023 DOS: the patient was seen and examined on 06/20/2023 PCP: Carylon Perches, MD  Patient coming from: Home  Chief Complaint:  Chief Complaint  Patient presents with   Emesis   Constipation   HPI: Frank [name] is a 72 y.o. male with medical history significant of arthritis, GERD, CKD, hyperlipidemia, hypertension, and more presents the ED with a chief complaint of nausea and vomiting.  Patient reports that he has had nausea and vomiting since the night of the 18th.  He has vomited roughly 3 times per day.  It has been nonbloody emesis.  He denies abdominal pain but reports he has had discomfort in his abdomen.  He reports his abdomen feels bloated.  His last p.o. intake was on the 19th at 4 PM and was chicken and peaches.  He threw that up about 30 minutes after eating it.  His last bowel movement was on Tuesday or Wednesday.  He reports that is not normal for him and he has felt constipated.  He also had a fever of 102 on Tuesday or Wednesday.  Wife reports that he does not remember some of this because he felt so poorly.  She thinks his memory is worse than his baseline due to how sick he has been.  They were giving him Tylenol for his fever and it helped but his fever would come right back.  It lasted approximately 24-36 hours.  Patient reports right now he feels bloated.  He describes the discomfort as feeling too full.  Wife reports he has had constant eructation and has been requesting antacids and Rolaids.  Patient has never had a history of perforation.  He has recently had an L4-L5 fusion surgery.  It was a posterior approach.  He has no surgical pain.  Wife reports he did take Percocet on day 1 and day 2 postop.  He has not taken much Percocet since then.  He is now on postop day 9.  He has no other complaints at this time.  Patient does not smoke, does not drink.  He has been vaccinated for COVID and  flu.  Patient reports that he would like to be full code.  Wife volunteers that they have both decided they would want to be DNR if terminal, but otherwise full code.  Clarification is going to be needed. Review of Systems: As mentioned in the history of present illness. All other systems reviewed and are negative. Past Medical History:  Diagnosis Date   Arthritis    Barrett's esophagus    BPH (benign prostatic hyperplasia)    Chronic kidney disease    Fracture of 5th metatarsal    GERD (gastroesophageal reflux disease)    Hypercholesteremia    Hypertension    Mastoiditis    Pre-diabetes    Ulcer disease    Bulbar   Past Surgical History:  Procedure Laterality Date   BACK SURGERY     BIOPSY  12/12/2021   Procedure: BIOPSY;  Surgeon: Malissa Hippo, MD;  Location: [facility];  Service: Endoscopy;;   COLONOSCOPY     COLONOSCOPY     COLONOSCOPY N/A 08/27/2016   Procedure: COLONOSCOPY;  Surgeon: Malissa Hippo, MD;  Location: [facility];  Service: Endoscopy;  Laterality: N/A;  930 - moved to 9/27 @ 12:00 - Ann notified pt   COLONOSCOPY N/A 12/12/2021   Procedure: COLONOSCOPY;  Surgeon: Malissa Hippo, MD;  Location: [facility];  Service: Endoscopy;  Laterality: N/A;  730   HEMORRHOID SURGERY  12/02/2007   INGUINAL HERNIA REPAIR Right 10/15/2020   Procedure: OPEN RIGHT INGUINAL HERNIA REPAIR WITH MESH;  Surgeon: Ovidio Kin, MD;  Location[facility] OR;  Service: General;  Laterality: Right;   INSERTION OF MESH Right 10/15/2020   Procedure: INSERTION OF MESH;  Surgeon: Ovidio Kin, MD;  Location: [facility];  Service: General;  Laterality: Right;   KNEE ARTHROSCOPY     Right Knee   ORIF TOE FRACTURE Left 12/09/2013   Procedure: LEFT OPEN REDUCTION INTERNAL FIXATION (ORIF) FIFTH METATARSAL FRACTURE;  Surgeon: Nilda Simmer, MD;  Location: [facility];  Service: Orthopedics;  Laterality: Left;   POLYPECTOMY  08/27/2016   Procedure: POLYPECTOMY;  Surgeon: Malissa Hippo, MD;  Location: [facility];  Service: Endoscopy;;  colon   POLYPECTOMY  12/12/2021   Procedure: POLYPECTOMY;  Surgeon: Malissa Hippo, MD;  Location: [facility];  Service: Endoscopy;;   rotor cuff  12/02/2007   right-   TONSILLECTOMY     UPPER GASTROINTESTINAL ENDOSCOPY     Social History:  reports that he has never smoked. He has never used smokeless tobacco. He reports that he does not drink alcohol and does not use drugs.  Allergies  Allergen Reactions   Metformin    Ampicillin Rash    Family History  Problem Relation Age of Onset   COPD Mother    Heart disease Father    Healthy Sister    Lung cancer Brother    Heart disease Brother    Heart disease Sister    Prostate cancer Brother    Healthy Son    Healthy Son     Prior to Admission medications   Medication Sig Start Date End Date Taking? Authorizing Provider  amLODipine (NORVASC) 5 MG tablet Take 5 mg by mouth daily.    [provider]  aspirin EC 81 MG tablet Take 1 tablet (81 mg total) by mouth daily. Swallow whole. 12/13/21   Rehman, Joline Maxcy, MD  chlorthalidone (HYGROTON) 25 MG tablet Take 25 mg by mouth daily. 02/23/19   [provider]  cyclobenzaprine (FLEXERIL) 5 MG tablet Take 1 tablet (5 mg total) by mouth 3 (three) times daily as needed for muscle spasms. 06/12/23   Tressie Stalker, MD  docusate sodium (COLACE) 100 MG capsule Take 1 capsule (100 mg total) by mouth 2 (two) times daily. 06/12/23   Tressie Stalker, MD  ezetimibe (ZETIA) 10 MG tablet Take 10 mg by mouth at bedtime. 08/04/20   [provider]  Ginger, Zingiber officinalis, (GINGER PO) Take 1 capsule by mouth daily.    [provider]  Glucosamine HCl 1000 MG TABS Take 2,000 mg by mouth daily.    [provider]  losartan (COZAAR) 100 MG tablet Take 100 mg by mouth daily.    [provider]  Misc Natural Products (TART CHERRY ADVANCED PO) Take 1 tablet by mouth daily.    [provider]  Multiple Vitamin (MULTIVITAMIN WITH MINERALS) TABS tablet Take 1 tablet by mouth daily.    [provider]  Omega-3 Fatty Acids (FISH OIL) 1000 MG CAPS Take 1,000 mg by mouth daily.    [provider]  omeprazole (PRILOSEC) 20 MG capsule Take 20 mg by mouth daily.  04/04/19   [provider]  oxyCODONE-acetaminophen (PERCOCET/ROXICET) 5-325 MG tablet Take 1-2 tablets by mouth every 4 (four) hours  as needed for moderate pain. 06/12/23   Tressie Stalker, MD  pioglitazone (ACTOS) 15 MG tablet Take 15 mg by mouth daily.    [provider]  TURMERIC PO Take 1 capsule by mouth daily.    [provider]    Physical Exam: Vitals:   06/19/23 2330 06/20/23 0009 06/20/23 0100 06/20/23 0256  BP: 115/72 116/81 114/79 118/80  Pulse: 78 76 74 75  Resp: 14 11 16 17   Temp:    98.3 F (36.8 C)  TempSrc:    Oral  SpO2: 90% 91% 95% 96%  Weight:    86.3 kg  Height:       1.  General: Patient lying supine in bed,  no acute distress   2. Psychiatric: Alert and oriented x 3, mood and behavior normal for situation, pleasant and cooperative with exam   3. Neurologic: Speech and language are normal, face is symmetric, moves all 4 extremities voluntarily, at baseline without acute deficits on limited exam   4. HEENMT:  Head is atraumatic, normocephalic, pupils reactive to light, neck is supple, trachea is midline, mucous membranes are moist   5. Respiratory : Lungs are clear to auscultation bilaterally without wheezing, rhonchi, rales, no cyanosis, no increase in work of breathing or accessory muscle use   6. Cardiovascular : Heart rate normal, rhythm is regular, no murmurs, rubs or gallops, no peripheral edema, peripheral pulses palpated   7. Gastrointestinal:  Abdomen is soft, mildly distended, minimally tender to palpation hypoactive bowel sounds, no masses or organomegaly palpated   8. Skin:  Blanchable erythematous rash on the arms and  legs, bruising on the left upper extremity   9.Musculoskeletal:  No acute deformities or trauma, no asymmetry in tone, no peripheral edema, peripheral pulses palpated, no tenderness to palpation in the extremities  Data Reviewed: In the ED Temp 98, heart rate 55-82, respiratory rate 11-20, blood pressure 102/72-120/91, satting 95% Leukocytosis at 12.9, hemoglobin 11.8, platelets 440 Slight hyponatremia 131, hypokalemia 2.4, hypochloremia 84 BUN 42 and creatinine 1.62 UA does not indicative UTI CT abdomen pelvis shows free air in the abdomen and pelvis, with focal fluid collections in the right lower quadrant better concerning for abscess, and stranding around the colon is concerning for diverticulitis with possible perforation. General surgery consulted and plans to take patient to the OR 7/20 Neurosurgery was also consulted at the request of general surgery and they reported no need to see him Patient was given 1 L bolus, Pepcid, Flagyl, Zofran, cefepime, potassium, and NG tube was placed in the ED Admission requested for SBO, diverticulitis with possible curve, developing abscesses in the abdomen  Assessment and Plan: * Diverticulitis of large intestine with abscess - CT abdomen pelvis shows status post anterior and posterior lumbar spinal fusion hardware from L4-L5 - Free air in the abdomen and pelvis more than anticipated for postoperative time concerning for bowel perforation - Focal fluid collections in the right lower quadrant and pelvis containing air concerning for developing abscess - Small bowel obstruction with transition zone in the right lower quadrant - Stranding of the sigmoid colon possible diverticulitis - Patient started on cefepime and Flagyl in the ED - Continue on Rocephin and Flagyl - General surgery consulted and plans to take patient to the OR in the a.m. - Pain control with morphine - Continue IV fluids - Associated leukocytosis of 12.9 - RN is measuring and  monitoring circumference of the abdomen - NG tube in place to gravity as ordered prior  to admission - Continue to monitor  SBO (small bowel obstruction) (HCC) - As seen on CT - Transition zone in the right lower quadrant - NG tube in place - Monitor output - General surgery consulted - N.p.o. except for ice chips for comfort - Pain control morphine - Continue to monitor  Rash - Blanchable erythematous rash on arms and legs - Has been there for several days - Try one-time dose of Benadryl and Solu-Medrol - Patient denies any pruritus or pain - Only medication is Percocet after lumbar fusion surgery - Continue to monitor  AKI (acute kidney injury) (HCC) - Creatinine up to 1.62 from 1.25 - Likely related to poor p.o. intake and GI losses of fluid - 1 L bolus given in the ED - Continue IV hydration - Trend creatinine in the a.m. - Avoid nephrotoxic agents when possible  Hypokalemia - Potassium down to 2.4 - Secondary to poor p.o. intake and GI losses with nausea/vomiting - 40 mEq of potassium ordered in the ED - General surgery ordered 60 mEq of potassium - Trend with a.m. labs  Sinus bradycardia - Heart rate as low as 55 recorded in the ED - Likely related to electrolyte abnormalities - Monitor and correct electrolytes as indicated - Continue to monitor on telemetry  HTN (hypertension) - Resume Norvasc, chlorthalidone, losartan when patient is able to tolerate p.o.  GERD (gastroesophageal reflux disease) - Resume Prilosec when patient is able to tolerate p.o. - In the meantime IV Protonix      Advance Care Planning:   Code Status: Full Code  Consults: General surgery  Family Communication: Wife at bedside  Severity of Illness: The appropriate patient status for this patient is INPATIENT. Inpatient status is judged to be reasonable and necessary in order to provide the required intensity of service to ensure the patient's safety. The patient's presenting  symptoms, physical exam findings, and initial radiographic and laboratory data in the context of their chronic comorbidities is felt to place them at high risk for further clinical deterioration. Furthermore, it is not anticipated that the patient will be medically stable for discharge from the hospital within 2 midnights of admission.   * I certify that at the point of admission it is my clinical judgment that the patient will require inpatient hospital care spanning beyond 2 midnights from the point of admission due to high intensity of service, high risk for further deterioration and high frequency of surveillance required.*  Author: Lilyan Gilford, DO 06/20/2023 4:58 AM  For on call review [email or web address].

## 2023-06-20 NOTE — ED Notes (Signed)
..ED TO INPATIENT HANDOFF REPORT  ED Nurse Name and Phone #:  Rosemary Holms, RN [phone]   S Name/Age/Gender Frank [name] 72 y.o. male Room/Bed: APA14/APA14  Code Status   Code Status: Full Code  Home/SNF/Other Home Patient oriented to: self, place, time, and situation Is this baseline? Yes   Triage Complete: Triage complete  Chief Complaint Diverticulitis of large intestine with abscess [K57.20]  Triage Note Pt here for c/o constipation since since having back surgery last Thursday (L4-L5 Lumbar Laminectomy). Wife states pt was doing okay when he got home and then started having constipation on Tues so she tried giving him stool softners, a Fleets Enema, a couple of Dulcolax (had very small loose stool) without much results. States that today, pt is unable to keep anything down and she is worried about a "blockage". Wife also reports that pt had "crackles" in lungs yesterday despite repeated use of Incentive Spirometry post-op. States pt started running a fever with Tmaxx being 102. Last dose of Tylenol was 1000mg  @ 1800 tonight. Wife reports pt fell earlier this week and now c/o pain to L arm and shoulder. Wife states pt will not move arm at shoulder. Wife states pt has rash to inner thighs and to flank bilaterally that radiates to back.    Allergies Allergies  Allergen Reactions   Metformin    Ampicillin Rash    Level of Care/Admitting Diagnosis ED Disposition     ED Disposition  Admit   Condition  --   Comment  Hospital [facility] [[id number]]  Level of Care: Telemetry [5]  Covid Evaluation: Asymptomatic - no recent exposure (last 10 days) testing not required  Diagnosis: Diverticulitis of large intestine with abscess [[id number]]  Admitting Physician: Lilyan Gilford [[id number]]  Attending Physician: Lilyan Gilford [[id number]]  Certification:: I certify this patient will need inpatient services for at least 2 midnights  Estimated Length of Stay: 2           B Medical/Surgery History Past Medical History:  Diagnosis Date   Arthritis    Barrett's esophagus    BPH (benign prostatic hyperplasia)    Chronic kidney disease    Fracture of 5th metatarsal    GERD (gastroesophageal reflux disease)    Hypercholesteremia    Hypertension    Mastoiditis    Pre-diabetes    Ulcer disease    Bulbar   Past Surgical History:  Procedure Laterality Date   BACK SURGERY     BIOPSY  12/12/2021   Procedure: BIOPSY;  Surgeon: Malissa Hippo, MD;  [facility];  Service: Endoscopy;;   COLONOSCOPY     COLONOSCOPY     COLONOSCOPY N/A 08/27/2016   Procedure: COLONOSCOPY;  Surgeon: Malissa Hippo, MD;  [facility];  Service: Endoscopy;  Laterality: N/A;  930 - moved to 9/27 @ 12:00 - Ann notified pt   COLONOSCOPY N/A 12/12/2021   Procedure: COLONOSCOPY;  Surgeon: Malissa Hippo, MD;  [facility];  Service: Endoscopy;  Laterality: N/A;  730   HEMORRHOID SURGERY  12/02/2007   INGUINAL HERNIA REPAIR Right 10/15/2020   Procedure: OPEN RIGHT INGUINAL HERNIA REPAIR WITH MESH;  Surgeon: Ovidio Kin, MD;  Location: [facility] OR;  Service: General;  Laterality: Right;   INSERTION OF MESH Right 10/15/2020   Procedure: INSERTION OF MESH;  Surgeon: Ovidio Kin, MD;  Location: [facility] OR;  Service: General;  Laterality: Right;   KNEE ARTHROSCOPY  Right Knee   ORIF TOE FRACTURE Left 12/09/2013   Procedure: LEFT OPEN REDUCTION INTERNAL FIXATION (ORIF) FIFTH METATARSAL FRACTURE;  Surgeon: Nilda Simmer, MD;  Location: [facility];  Service: Orthopedics;  Laterality: Left;   POLYPECTOMY  08/27/2016   Procedure: POLYPECTOMY;  Surgeon: Malissa Hippo, MD;  [facility];  Service: Endoscopy;;  colon   POLYPECTOMY  12/12/2021   Procedure: POLYPECTOMY;  Surgeon: Malissa Hippo, MD;  [facility];  Service: Endoscopy;;   rotor cuff  12/02/2007   right-   TONSILLECTOMY     UPPER  GASTROINTESTINAL ENDOSCOPY       A IV Location/Drains/Wounds Patient Lines/Drains/Airways Status     Active Line/Drains/Airways     Name Placement date Placement time Site Days   Peripheral IV 06/19/23 20 G Right Antecubital 06/19/23  2043  Antecubital  1   Peripheral IV 06/19/23 20 G Distal;Posterior;Right Forearm 06/19/23  2308  Forearm  1   NG/OG Vented/Dual Lumen 18 Fr. Left nare Marking at nare/corner of mouth 64 cm 06/20/23  0138  Left nare  less than 1            Intake/Output Last 24 hours No intake or output data in the 24 hours ending 06/20/23 0226  Labs/Imaging Results for orders placed or performed during the hospital encounter of 06/19/23 (from the past 48 hour(s))  Lipase, blood     Status: None   Collection Time: 06/19/23  8:42 PM  Result Value Ref Range   Lipase 23 11 - 51 U/L    Comment: Performed at [facility], [address], [place], [place] [address]  Comprehensive metabolic panel     Status: Abnormal   Collection Time: 06/19/23  8:42 PM  Result Value Ref Range   Sodium 131 (L) 135 - 145 mmol/L   Potassium 2.4 (LL) 3.5 - 5.1 mmol/L    Comment: CRITICAL RESULT CALLED TO, READ BACK BY AND VERIFIED WITH GREEN[place]OD,B ON 06/19/23 AT 2130 BY LOY,C   Chloride 84 (L) 98 - 111 mmol/L   CO2 32 22 - 32 mmol/L   Glucose, Bld 162 (H) 70 - 99 mg/dL    Comment: Glucose reference range applies only to samples taken after fasting for at least 8 hours.   BUN 42 (H) 8 - 23 mg/dL   Creatinine, Ser 1.91 (H) 0.61 - 1.24 mg/dL   Calcium 9.1 8.9 - 47.8 mg/dL   Total Protein 7.4 6.5 - 8.1 g/dL   Albumin 3.1 (L) 3.5 - 5.0 g/dL   AST 33 15 - 41 U/L   ALT 32 0 - 44 U/L   Alkaline Phosphatase 59 38 - 126 U/L   Total Bilirubin 1.7 (H) 0.3 - 1.2 mg/dL   GFR, Estimated 45 (L) >60 mL/min    Comment: (NOTE) Calculated using the CKD-EPI Creatinine Equation (2021)    Anion gap 15 5 - 15    Comment: Performed at [facility], [address], [place], [place] [address]  CBC      Status: Abnormal   Collection Time: 06/19/23  8:42 PM  Result Value Ref Range   WBC 12.9 (H) 4.0 - 10.5 K/uL   RBC 3.98 (L) 4.22 - 5.81 MIL/uL   Hemoglobin 11.8 (L) 13.0 - 17.0 g/dL   HCT 13.0 (L) 86.5 - 78.4 %   MCV 85.7 80.0 - 100.0 fL   MCH 29.6 26.0 - 34.0 pg   MCHC 34.6 30.0 - 36.0 g/dL  RDW 13.5 11.5 - 15.5 %   Platelets 440 (H) 150 - 400 K/uL   nRBC 0.0 0.0 - 0.2 %    Comment: Performed at [facility], [address] [place], [place] [address]  Lactic acid, plasma     Status: None   Collection Time: 06/19/23  8:42 PM  Result Value Ref Range   Lactic Acid, Venous 1.3 0.5 - 1.9 mmol/L    Comment: Performed at [facility], [address] [place], [place] [address]  Magnesium     Status: None   Collection Time: 06/19/23  8:42 PM  Result Value Ref Range   Magnesium 2.0 1.7 - 2.4 mg/dL    Comment: Performed at [facility], [address], [place], [place] [address]  Urinalysis, Routine w reflex microscopic -Urine, Clean Catch     Status: Abnormal   Collection Time: 06/19/23  9:50 PM  Result Value Ref Range   Color, Urine YELLOW YELLOW   APPearance HAZY (A) CLEAR   Specific Gravity, Urine 1.018 1.005 - 1.030   pH 5.0 5.0 - 8.0   Glucose, UA NEGATIVE NEGATIVE mg/dL   Hgb urine dipstick MODERATE (A) NEGATIVE   Bilirubin Urine NEGATIVE NEGATIVE   Ketones, ur NEGATIVE NEGATIVE mg/dL   Protein, ur 30 (A) NEGATIVE mg/dL   Nitrite NEGATIVE NEGATIVE   Leukocytes,Ua NEGATIVE NEGATIVE   RBC / HPF 11-20 0 - 5 RBC/hpf   WBC, UA 0-5 0 - 5 WBC/hpf   Bacteria, UA NONE SEEN NONE SEEN   Squamous Epithelial / HPF 0-5 0 - 5 /HPF   Mucus PRESENT     Comment: Performed at [facility], [address], [place], [place] [address]  Potassium     Status: Abnormal   Collection Time: 06/20/23  1:21 AM  Result Value Ref Range   Potassium 2.7 (LL) 3.5 - 5.1 mmol/L    Comment: CRITICAL RESULT CALLED TO, READ BACK BY AND VERIFIED WITH CLINTON,A @ 0146 ON 06/20/23 BY JUW Performed at [facility], [address] [place], [place] [address]    DG ABD ACUTE 2+V W 1V CHEST  Addendum Date: 06/19/2023   ADDENDUM REPORT: 06/19/2023 23:52 ADDENDUM: These results were called by telephone at the time of interpretation on 06/19/2023 at 10:56 p.m. to provider Dr. Audrie Lia, who verbally acknowledged these results. Electronically Signed   By: Darliss Cheney M.D.   On: 06/19/2023 23:52   Result Date: 06/19/2023 CLINICAL DATA:  Postoperative fever EXAM: DG ABDOMEN ACUTE WITH 1 VIEW CHEST COMPARISON:  CT abdomen and pelvis 09/05/2020 FINDINGS: The lungs are clear.  There is no pleural effusion or pneumothorax. There is a small amount of free air under the right hemidiaphragm. There are dilated small bowel loops in the central abdomen measuring up to 6 cm. The colon appears nondilated. There are no suspicious calcifications identified. Lower lumbar fusion hardware is unremarkable. No acute fractures are seen. IMPRESSION: 1. Small amount of free air under the right hemidiaphragm. 2. Dilated small bowel loops in the central abdomen measuring up to 6 cm. Findings are concerning for small bowel obstruction. 3. No acute cardiopulmonary process. Electronically Signed: By: Darliss Cheney M.D. On: 06/19/2023 21:49   CT ABDOMEN PELVIS W CONTRAST  Result Date: 06/19/2023 CLINICAL DATA:  Constipation, emesis. Bowel obstruction suspected. Back surgery last Thursday, July 11th. EXAM: CT ABDOMEN AND PELVIS WITH CONTRAST TECHNIQUE: Multidetector CT imaging of the abdomen and pelvis was performed using the standard protocol following bolus administration of intravenous contrast. RADIATION  DOSE REDUCTION: This exam was performed according to the departmental dose-optimization program which includes automated exposure control, adjustment of the mA and/or kV according to patient size and/or use of iterative reconstruction technique. CONTRAST:  80mL OMNIPAQUE IOHEXOL 300 MG/ML  SOLN COMPARISON:  09/05/2020. FINDINGS: Lower chest: Heart  is normal in size and there is no pericardial effusion. Scattered coronary artery calcifications are noted. Mild atelectasis is present at the lung bases. Hepatobiliary: A cyst is present in the left lobe of the liver. No biliary ductal dilatation. Gallbladder is without stones. Pancreas: Unremarkable. No pancreatic ductal dilatation or surrounding inflammatory changes. Spleen: Normal in size without focal abnormality. Adrenals/Urinary Tract: No adrenal nodule or mass. The kidneys enhance symmetrically. Subcentimeter hypodensities are present bilaterally which are too small to further characterize. Nonobstructive renal calculi are noted bilaterally. No ureteral calculus or obstructive uropathy. There is diffuse bladder wall thickening. Stomach/Bowel: The stomach is within normal limits. Multiple distended loops of small bowel are noted in the abdomen with air-fluid levels measuring up to 3.7 cm. A transition point is noted in the right lower quadrant with decompression of the distal small bowel, axial image 79. Multiple diverticula are present along the colon. There is bowel wall thickening at the sigmoid colon with mild surrounding inflammatory changes in the pelvis. The appendix appears normal. Vascular/Lymphatic: Aortic atherosclerosis. No enlarged abdominal or pelvic lymph nodes. Reproductive: Prostate gland is mildly enlarged. Other: Small amount of free fluid with foci of air are noted in the pelvis. There is a loculated fluid collection with air in the right lower quadrant measuring 4.0 x 2.3 cm, adjacent to the region of bowel obstruction. A moderate amount of free air is noted in the abdomen and in a fat containing umbilical hernia. Musculoskeletal: Anterior and posterior lumbar spinal fusion hardware is noted from L4-L5. Degenerative changes are noted in the thoracolumbar spine. IMPRESSION: 1. Status post anterior and posterior lumbar spinal fusion hardware from L4-L5. 2. Moderate amount of free air in the  abdomen and pelvis, more than anticipated for postoperative time. Findings are concerning for possible bowel perforation. Surgical consultation is recommended. 3. Focal fluid collections in the right lower quadrant and pelvis containing air, concerning for developing abscess. Dilated loops of small bowel in the abdomen and pelvis with a transition point in the right lower quadrant adjacent to a fluid collection and decompression of the small bowel distal to this point, concerning for small bowel obstruction. 4. Colonic diverticulosis with bowel wall thickening and fat stranding at the sigmoid colon, possible diverticulitis versus local inflammatory changes. 5. Bladder wall thickening, suggesting infectious or inflammatory cystitis. 6. Bilateral nephrolithiasis. 7. Coronary artery calcifications and aortic atherosclerosis. 8. Remaining incidental findings as described above. Critical Value/emergent results were called by telephone at the time of interpretation on 06/19/2023 at 10:55 pm to provider Dr. Audrie Lia, who verbally acknowledged these results. Electronically Signed   By: Thornell Sartorius M.D.   On: 06/19/2023 22:56   DG Shoulder Left  Result Date: 06/19/2023 CLINICAL DATA:  Postoperative fever and constipation. Shoulder pain after recent fall. Stomach pain, vomiting, and nausea. EXAM: LEFT SHOULDER - 2+ VIEW COMPARISON:  None Available. FINDINGS: Degenerative changes in the glenohumeral joint with mild glenoid osteophyte formation. No evidence of acute fracture or dislocation. No focal bone lesion or bone destruction. Soft tissues are unremarkable. IMPRESSION: Mild degenerative changes in the glenohumeral joint. No acute bony abnormalities. Electronically Signed   By: Burman Nieves M.D.   On: 06/19/2023 21:49    Pending  Labs [employer] (From admission, onward)     Start     Ordered   06/20/23 0700  Basic metabolic panel  Once-Timed,   STAT        06/20/23 0021   Signed and Held  Comprehensive  metabolic panel  Tomorrow morning,   R        Signed and Held   Signed and Held  Magnesium  Tomorrow morning,   R        Signed and Held   Signed and Held  CBC with Differential/Platelet  Tomorrow morning,   R        Signed and Held            Vitals/Pain Today's Vitals   06/19/23 2330 06/20/23 0009 06/20/23 0010 06/20/23 0100  BP: 115/72 116/81  114/79  Pulse: 78 76  74  Resp: 14 11  16   Temp:      TempSrc:      SpO2: 90% 91%  95%  Weight:      Height:      PainSc:   0-No pain     Isolation Precautions No active isolations  Medications Medications  potassium chloride 10 MEQ/100ML IVPB (  Canceled Entry 06/19/23 2354)  potassium chloride 10 mEq in 100 mL IVPB (10 mEq Intravenous New Bag/Given 06/20/23 0056)  0.9 %  sodium chloride infusion ( Intravenous New Bag/Given 06/20/23 0148)  phenol (CHLORASEPTIC) mouth spray 1 spray (has no administration in time range)  ondansetron (ZOFRAN) injection 4 mg (4 mg Intravenous Given 06/19/23 2111)  lactated ringers bolus 1,000 mL (0 mLs Intravenous Stopped 06/20/23 0224)  fentaNYL (SUBLIMAZE) injection 50 mcg (50 mcg Intravenous Given 06/19/23 2205)  iohexol (OMNIPAQUE) 300 MG/ML solution 80 mL (80 mLs Intravenous Contrast Given 06/19/23 2216)  metroNIDAZOLE (FLAGYL) IVPB 500 mg (0 mg Intravenous Stopped 06/20/23 0010)  ceFEPIme (MAXIPIME) 2 g in sodium chloride 0.9 % 100 mL IVPB (0 g Intravenous Stopped 06/19/23 2341)  famotidine (PEPCID) IVPB 20 mg premix (0 mg Intravenous Stopped 06/20/23 0040)    Mobility walks     Focused Assessments Cardiac Assessment Handoff:    No results found for: "CKTOTAL", "CKMB", "CKMBINDEX", "TROPONINI" No results found for: "DDIMER" Does the Patient currently have chest pain? No    R Recommendations: See Admitting Provider Note  Report given to:   Additional Notes: NG tube placed. Capped off at this. Pending placement verification. A/Ox4.

## 2023-06-20 NOTE — H&P (View-Only) (Signed)
Reason for Consult: Pneumoperitoneum, perforated viscus, intra-abdominal abscess Referring Physician: [name] is an 72 y.o. male.  HPI: Patient is a 72 year old white male status post lumbar laminectomy on 06/11/2023 who presents with with a 3 to 4-day history of worsening abdominal pain, nausea, vomiting.  He presented to the emergency room and was found to have a small amount of pneumoperitoneum, sigmoid colitis, and the partial small bowel obstruction with intra-abdominal abscess.  He has never had abdominal surgery.  He was admitted to the hospitalist for further evaluation and treatment.  He was noted to be severely hypokalemic at 2.4.  Past Medical History:  Diagnosis Date   Arthritis    Barrett's esophagus    BPH (benign prostatic hyperplasia)    Chronic kidney disease    Fracture of 5th metatarsal    GERD (gastroesophageal reflux disease)    Hypercholesteremia    Hypertension    Mastoiditis    Pre-diabetes    Ulcer disease    Bulbar    Past Surgical History:  Procedure Laterality Date   BACK SURGERY     BIOPSY  12/12/2021   Procedure: BIOPSY;  Surgeon: Malissa Hippo, MD;  [facility];  Service: Endoscopy;;   COLONOSCOPY     COLONOSCOPY     COLONOSCOPY N/A 08/27/2016   Procedure: COLONOSCOPY;  Surgeon: Malissa Hippo, MD;  [facility];  Service: Endoscopy;  Laterality: N/A;  930 - moved to 9/27 @ 12:00 - Ann notified pt   COLONOSCOPY N/A 12/12/2021   Procedure: COLONOSCOPY;  Surgeon: Malissa Hippo, MD;  [facility];  Service: Endoscopy;  Laterality: N/A;  730   HEMORRHOID SURGERY  12/02/2007   INGUINAL HERNIA REPAIR Right 10/15/2020   Procedure: OPEN RIGHT INGUINAL HERNIA REPAIR WITH MESH;  Surgeon: Ovidio Kin, MD;  Location: [facility] OR;  Service: General;  Laterality: Right;   INSERTION OF MESH Right 10/15/2020   Procedure: INSERTION OF MESH;  Surgeon: Ovidio Kin, MD;  Location: [facility] OR;  Service: General;   Laterality: Right;   KNEE ARTHROSCOPY     Right Knee   ORIF TOE FRACTURE Left 12/09/2013   Procedure: LEFT OPEN REDUCTION INTERNAL FIXATION (ORIF) FIFTH METATARSAL FRACTURE;  Surgeon: Nilda Simmer, MD;  Location: [facility];  Service: Orthopedics;  Laterality: Left;   POLYPECTOMY  08/27/2016   Procedure: POLYPECTOMY;  Surgeon: Malissa Hippo, MD;  [facility];  Service: Endoscopy;;  colon   POLYPECTOMY  12/12/2021   Procedure: POLYPECTOMY;  Surgeon: Malissa Hippo, MD;  [facility];  Service: Endoscopy;;   rotor cuff  12/02/2007   right-   TONSILLECTOMY     UPPER GASTROINTESTINAL ENDOSCOPY      Family History  Problem Relation Age of Onset   COPD Mother    Heart disease Father    Healthy Sister    Lung cancer Brother    Heart disease Brother    Heart disease Sister    Prostate cancer Brother    Healthy Son    Healthy Son     Social History:  reports that he has never smoked. He has never used smokeless tobacco. He reports that he does not drink alcohol and does not use drugs.  Allergies:  Allergies  Allergen Reactions   Metformin    Ampicillin Rash    Medications: I have reviewed the patient's current medications. Prior to Admission:  Medications Prior to Admission  Medication  Sig Dispense Refill Last Dose   amLODipine (NORVASC) 5 MG tablet Take 5 mg by mouth daily.      aspirin EC 81 MG tablet Take 1 tablet (81 mg total) by mouth daily. Swallow whole. 30 tablet 11    chlorthalidone (HYGROTON) 25 MG tablet Take 25 mg by mouth daily.      cyclobenzaprine (FLEXERIL) 5 MG tablet Take 1 tablet (5 mg total) by mouth 3 (three) times daily as needed for muscle spasms. 50 tablet 0    docusate sodium (COLACE) 100 MG capsule Take 1 capsule (100 mg total) by mouth 2 (two) times daily. 30 capsule 0    ezetimibe (ZETIA) 10 MG tablet Take 10 mg by mouth at bedtime.      Ginger, Zingiber officinalis, (GINGER PO) Take 1 capsule by mouth  daily.      Glucosamine HCl 1000 MG TABS Take 2,000 mg by mouth daily.      losartan (COZAAR) 100 MG tablet Take 100 mg by mouth daily.      Misc Natural Products (TART CHERRY ADVANCED PO) Take 1 tablet by mouth daily.      Multiple Vitamin (MULTIVITAMIN WITH MINERALS) TABS tablet Take 1 tablet by mouth daily.      Omega-3 Fatty Acids (FISH OIL) 1000 MG CAPS Take 1,000 mg by mouth daily.      omeprazole (PRILOSEC) 20 MG capsule Take 20 mg by mouth daily.       oxyCODONE-acetaminophen (PERCOCET/ROXICET) 5-325 MG tablet Take 1-2 tablets by mouth every 4 (four) hours as needed for moderate pain. 30 tablet 0    pioglitazone (ACTOS) 15 MG tablet Take 15 mg by mouth daily.      TURMERIC PO Take 1 capsule by mouth daily.       Results for orders placed or performed during the hospital encounter of 06/19/23 (from the past 48 hour(s))  Lipase, blood     Status: None   Collection Time: 06/19/23  8:42 PM  Result Value Ref Range   Lipase 23 11 - 51 U/L    Comment: Performed at [facility], [address], [place], [place] [address]  Comprehensive metabolic panel     Status: Abnormal   Collection Time: 06/19/23  8:42 PM  Result Value Ref Range   Sodium 131 (L) 135 - 145 mmol/L   Potassium 2.4 (LL) 3.5 - 5.1 mmol/L    Comment: CRITICAL RESULT CALLED TO, READ BACK BY AND VERIFIED WITH [place]EN[place],B ON 06/19/23 AT 2130 BY LOY,C   Chloride 84 (L) 98 - 111 mmol/L   CO2 32 22 - 32 mmol/L   Glucose, Bld 162 (H) 70 - 99 mg/dL    Comment: Glucose reference range applies only to samples taken after fasting for at least 8 hours.   BUN 42 (H) 8 - 23 mg/dL   Creatinine, Ser 6.04 (H) 0.61 - 1.24 mg/dL   Calcium 9.1 8.9 - 54.0 mg/dL   Total Protein 7.4 6.5 - 8.1 g/dL   Albumin 3.1 (L) 3.5 - 5.0 g/dL   AST 33 15 - 41 U/L   ALT 32 0 - 44 U/L   Alkaline Phosphatase 59 38 - 126 U/L   Total Bilirubin 1.7 (H) 0.3 - 1.2 mg/dL   GFR, Estimated 45 (L) >60 mL/min    Comment: (NOTE) Calculated using the CKD-EPI  Creatinine Equation (2021)    Anion gap 15 5 - 15    Comment: Performed at [facility], [address] [place], [place]  [address]  CBC     Status: Abnormal   Collection Time: 06/19/23  8:42 PM  Result Value Ref Range   WBC 12.9 (H) 4.0 - 10.5 K/uL   RBC 3.98 (L) 4.22 - 5.81 MIL/uL   Hemoglobin 11.8 (L) 13.0 - 17.0 g/dL   HCT 82.9 (L) 56.2 - 13.0 %   MCV 85.7 80.0 - 100.0 fL   MCH 29.6 26.0 - 34.0 pg   MCHC 34.6 30.0 - 36.0 g/dL   RDW 86.5 78.4 - 69.6 %   Platelets 440 (H) 150 - 400 K/uL   nRBC 0.0 0.0 - 0.2 %    Comment: Performed at [facility], [address], [place], [place] [address]  Lactic acid, plasma     Status: None   Collection Time: 06/19/23  8:42 PM  Result Value Ref Range   Lactic Acid, Venous 1.3 0.5 - 1.9 mmol/L    Comment: Performed at [facility] [address], [place], [place] [address]  Magnesium     Status: None   Collection Time: 06/19/23  8:42 PM  Result Value Ref Range   Magnesium 2.0 1.7 - 2.4 mg/dL    Comment: Performed at [facility], [address], [place], [place] [address]  Urinalysis, Routine w reflex microscopic -Urine, Clean Catch     Status: Abnormal   Collection Time: 06/19/23  9:50 PM  Result Value Ref Range   Color, Urine YELLOW YELLOW   APPearance HAZY (A) CLEAR   Specific Gravity, Urine 1.018 1.005 - 1.030   pH 5.0 5.0 - 8.0   Glucose, UA NEGATIVE NEGATIVE mg/dL   Hgb urine dipstick MODERATE (A) NEGATIVE   Bilirubin Urine NEGATIVE NEGATIVE   Ketones, ur NEGATIVE NEGATIVE mg/dL   Protein, ur 30 (A) NEGATIVE mg/dL   Nitrite NEGATIVE NEGATIVE   Leukocytes,Ua NEGATIVE NEGATIVE   RBC / HPF 11-20 0 - 5 RBC/hpf   WBC, UA 0-5 0 - 5 WBC/hpf   Bacteria, UA NONE SEEN NONE SEEN   Squamous Epithelial / HPF 0-5 0 - 5 /HPF   Mucus PRESENT     Comment: Performed at [facility], [address] [place], [place] [address]  Potassium     Status: Abnormal   Collection Time: 06/20/23  1:21 AM  Result Value Ref Range   Potassium 2.7 (LL) 3.5 -  5.1 mmol/L    Comment: CRITICAL RESULT CALLED TO, READ BACK BY AND VERIFIED WITH CLINTON,A @ 0146 ON 06/20/23 BY JUW Performed at [facility], [address], [place], [place] [address]   Comprehensive metabolic panel     Status: Abnormal   Collection Time: 06/20/23  4:13 AM  Result Value Ref Range   Sodium 131 (L) 135 - 145 mmol/L   Potassium 2.7 (LL) 3.5 - 5.1 mmol/L    Comment: CRITICAL RESULT CALLED TO, READ BACK BY AND VERIFIED WITH TARA @ 0723 ON [id number] BY HENDERSON L   Chloride 88 (L) 98 - 111 mmol/L   CO2 33 (H) 22 - 32 mmol/L   Glucose, Bld 136 (H) 70 - 99 mg/dL    Comment: Glucose reference range applies only to samples taken after fasting for at least 8 hours.   BUN 38 (H) 8 - 23 mg/dL   Creatinine, Ser 5.63 (H) 0.61 - 1.24 mg/dL   Calcium 8.9 8.9 - 87.5 mg/dL   Total Protein 6.5 6.5 - 8.1 g/dL   Albumin 2.7 (L) 3.5 - 5.0 g/dL   AST 26 15 - 41  U/L   ALT 25 0 - 44 U/L   Alkaline Phosphatase 52 38 - 126 U/L   Total Bilirubin 1.0 0.3 - 1.2 mg/dL   GFR, Estimated 54 (L) >60 mL/min    Comment: (NOTE) Calculated using the CKD-EPI Creatinine Equation (2021)    Anion gap 10 5 - 15    Comment: Performed at [facility], [address] [place], [place] [address]  Magnesium     Status: None   Collection Time: 06/20/23  4:13 AM  Result Value Ref Range   Magnesium 2.0 1.7 - 2.4 mg/dL    Comment: Performed at [facility], [address] [place], [place] [address]  CBC with Differential/Platelet     Status: Abnormal   Collection Time: 06/20/23  4:13 AM  Result Value Ref Range   WBC 12.1 (H) 4.0 - 10.5 K/uL   RBC 3.56 (L) 4.22 - 5.81 MIL/uL   Hemoglobin 10.5 (L) 13.0 - 17.0 g/dL   HCT 10.2 (L) 72.5 - 36.6 %   MCV 87.9 80.0 - 100.0 fL   MCH 29.5 26.0 - 34.0 pg   MCHC 33.5 30.0 - 36.0 g/dL   RDW 44.0 34.7 - 42.5 %   Platelets 408 (H) 150 - 400 K/uL   nRBC 0.0 0.0 - 0.2 %   Neutrophils Relative % 85 %   Neutro Abs 10.3 (H) 1.7 - 7.7 K/uL   Lymphocytes Relative 6 %   Lymphs  Abs 0.7 0.7 - 4.0 K/uL   Monocytes Relative 6 %   Monocytes Absolute 0.8 0.1 - 1.0 K/uL   Eosinophils Relative 2 %   Eosinophils Absolute 0.2 0.0 - 0.5 K/uL   Basophils Relative 0 %   Basophils Absolute 0.0 0.0 - 0.1 K/uL   Immature Granulocytes 1 %   Abs Immature Granulocytes 0.07 0.00 - 0.07 K/uL    Comment: Performed at [facility], [address], [place], [place] [address]    DG Chest Portable 1 View  Result Date: 06/20/2023 CLINICAL DATA:  NG placement. EXAM: PORTABLE CHEST 1 VIEW COMPARISON:  Chest radiograph dated 06/19/2023. FINDINGS: Enteric tube with side-port in the proximal stomach and tip beyond the inferior margin of the image. No interval change in the appearance of the lungs since the earlier radiograph. Trace pneumoperitoneum suspected under the right hemidiaphragm. No acute osseous pathology. IMPRESSION: 1. Enteric tube with side-port in the proximal stomach and tip beyond the inferior margin of the image. 2. Trace pneumoperitoneum suspected under the right hemidiaphragm. Electronically Signed   By: Elgie Collard M.D.   On: 06/20/2023 02:24   DG ABD ACUTE 2+V W 1V CHEST  Addendum Date: 06/19/2023   ADDENDUM REPORT: 06/19/2023 23:52 ADDENDUM: These results were called by telephone at the time of interpretation on 06/19/2023 at 10:56 p.m. to provider Dr. Audrie Lia, who verbally acknowledged these results. Electronically Signed   By: Darliss Cheney M.D.   On: 06/19/2023 23:52   Result Date: 06/19/2023 CLINICAL DATA:  Postoperative fever EXAM: DG ABDOMEN ACUTE WITH 1 VIEW CHEST COMPARISON:  CT abdomen and pelvis 09/05/2020 FINDINGS: The lungs are clear.  There is no pleural effusion or pneumothorax. There is a small amount of free air under the right hemidiaphragm. There are dilated small bowel loops in the central abdomen measuring up to 6 cm. The colon appears nondilated. There are no suspicious calcifications identified. Lower lumbar fusion hardware is unremarkable. No acute  fractures are seen. IMPRESSION: 1. Small amount of free air under the right hemidiaphragm.  2. Dilated small bowel loops in the central abdomen measuring up to 6 cm. Findings are concerning for small bowel obstruction. 3. No acute cardiopulmonary process. Electronically Signed: By: Darliss Cheney M.D. On: 06/19/2023 21:49   CT ABDOMEN PELVIS W CONTRAST  Result Date: 06/19/2023 CLINICAL DATA:  Constipation, emesis. Bowel obstruction suspected. Back surgery last Thursday, July 11th. EXAM: CT ABDOMEN AND PELVIS WITH CONTRAST TECHNIQUE: Multidetector CT imaging of the abdomen and pelvis was performed using the standard protocol following bolus administration of intravenous contrast. RADIATION DOSE REDUCTION: This exam was performed according to the departmental dose-optimization program which includes automated exposure control, adjustment of the mA and/or kV according to patient size and/or use of iterative reconstruction technique. CONTRAST:  80mL OMNIPAQUE IOHEXOL 300 MG/ML  SOLN COMPARISON:  09/05/2020. FINDINGS: Lower chest: Heart is normal in size and there is no pericardial effusion. Scattered coronary artery calcifications are noted. Mild atelectasis is present at the lung bases. Hepatobiliary: A cyst is present in the left lobe of the liver. No biliary ductal dilatation. Gallbladder is without stones. Pancreas: Unremarkable. No pancreatic ductal dilatation or surrounding inflammatory changes. Spleen: Normal in size without focal abnormality. Adrenals/Urinary Tract: No adrenal nodule or mass. The kidneys enhance symmetrically. Subcentimeter hypodensities are present bilaterally which are too small to further characterize. Nonobstructive renal calculi are noted bilaterally. No ureteral calculus or obstructive uropathy. There is diffuse bladder wall thickening. Stomach/Bowel: The stomach is within normal limits. Multiple distended loops of small bowel are noted in the abdomen with air-fluid levels measuring up  to 3.7 cm. A transition point is noted in the right lower quadrant with decompression of the distal small bowel, axial image 79. Multiple diverticula are present along the colon. There is bowel wall thickening at the sigmoid colon with mild surrounding inflammatory changes in the pelvis. The appendix appears normal. Vascular/Lymphatic: Aortic atherosclerosis. No enlarged abdominal or pelvic lymph nodes. Reproductive: Prostate gland is mildly enlarged. Other: Small amount of free fluid with foci of air are noted in the pelvis. There is a loculated fluid collection with air in the right lower quadrant measuring 4.0 x 2.3 cm, adjacent to the region of bowel obstruction. A moderate amount of free air is noted in the abdomen and in a fat containing umbilical hernia. Musculoskeletal: Anterior and posterior lumbar spinal fusion hardware is noted from L4-L5. Degenerative changes are noted in the thoracolumbar spine. IMPRESSION: 1. Status post anterior and posterior lumbar spinal fusion hardware from L4-L5. 2. Moderate amount of free air in the abdomen and pelvis, more than anticipated for postoperative time. Findings are concerning for possible bowel perforation. Surgical consultation is recommended. 3. Focal fluid collections in the right lower quadrant and pelvis containing air, concerning for developing abscess. Dilated loops of small bowel in the abdomen and pelvis with a transition point in the right lower quadrant adjacent to a fluid collection and decompression of the small bowel distal to this point, concerning for small bowel obstruction. 4. Colonic diverticulosis with bowel wall thickening and fat stranding at the sigmoid colon, possible diverticulitis versus local inflammatory changes. 5. Bladder wall thickening, suggesting infectious or inflammatory cystitis. 6. Bilateral nephrolithiasis. 7. Coronary artery calcifications and aortic atherosclerosis. 8. Remaining incidental findings as described above. Critical  Value/emergent results were called by telephone at the time of interpretation on 06/19/2023 at 10:55 pm to provider Dr. Audrie Lia, who verbally acknowledged these results. Electronically Signed   By: Thornell Sartorius M.D.   On: 06/19/2023 22:56   DG Shoulder Left  Result Date: 06/19/2023 CLINICAL DATA:  Postoperative fever and constipation. Shoulder pain after recent fall. Stomach pain, vomiting, and nausea. EXAM: LEFT SHOULDER - 2+ VIEW COMPARISON:  None Available. FINDINGS: Degenerative changes in the glenohumeral joint with mild glenoid osteophyte formation. No evidence of acute fracture or dislocation. No focal bone lesion or bone destruction. Soft tissues are unremarkable. IMPRESSION: Mild degenerative changes in the glenohumeral joint. No acute bony abnormalities. Electronically Signed   By: Burman Nieves M.D.   On: 06/19/2023 21:49    ROS:  Pertinent items are noted in HPI.  Blood pressure 118/80, pulse 75, temperature 98.3 F (36.8 C), temperature source Oral, resp. rate 17, height 5\' 11"  (1.803 m), weight 86.3 kg, SpO2 96%. Physical Exam: Pleasant white male who appears uncomfortable and fatigued Head is normocephalic, atraumatic Lungs clear to auscultation with equal breath sounds bilaterally Heart examination reveals a regular rate and rhythm without S3, S4, murmurs Abdomen is soft with nonspecific tenderness throughout.  No rigidity is noted.  No active bowel sounds appreciated.  CT scan images personally reviewed  Assessment/Plan: Impression: Perforated viscus with intra-abdominal abscess.  Etiologies include small bowel perforation versus perforated sigmoid diverticulitis with involvement of the small bowel. Plan: Patient will be taken to the operating room today for an exploratory laparotomy, possible bowel resection, possible colostomy.  The risks and benefits of the procedure including bleeding, infection, cardiopulmonary difficulties, and the possibility of a temporary colostomy  were fully explained to the patient, who gave informed consent.  Will continue supplementing potassium prior to surgery.  Frank [name] 06/20/2023, 8:37 AM

## 2023-06-20 NOTE — Anesthesia Preprocedure Evaluation (Addendum)
Anesthesia Evaluation  Patient identified by MRN, date of birth, ID band Patient awake    Reviewed: Allergy & Precautions, H&P , NPO status , Patient's Chart, lab work & pertinent test results  Airway Mallampati: III  TM Distance: >3 FB Neck ROM: Full    Dental  (+) Dental Advisory Given, Teeth Intact   Pulmonary neg pulmonary ROS   Pulmonary exam normal breath sounds clear to auscultation       Cardiovascular hypertension, Pt. on medications Normal cardiovascular exam Rhythm:Regular Rate:Normal  19-Jun-2023 20:23:19 [facility] ROUTINE RECORD September 30, 1951 (72 yr) Male Caucasian Vent. rate 83 BPM PR interval 168 ms QRS duration 118 ms QT/QTcB 428/503 ms P-R-T axes -6 -31 0 Sinus rhythm Ventricular premature complex Nonspecific intraventricular conduction delay Borderline T abnormalities, inferior leads   Neuro/Psych negative neurological ROS  negative psych ROS   GI/Hepatic Neg liver ROS,GERD  Poorly Controlled and Medicated,,  Endo/Other  negative endocrine ROS    Renal/GU Renal InsufficiencyRenal disease  negative genitourinary   Musculoskeletal  (+) Arthritis , Osteoarthritis,    Abdominal   Peds negative pediatric ROS (+)  Hematology negative hematology ROS (+)   Anesthesia Other Findings Hypokalemia   Reproductive/Obstetrics negative OB ROS                             Anesthesia Physical Anesthesia Plan  ASA: 3 and emergent  Anesthesia Plan: General   Post-op Pain Management: Dilaudid IV   Induction: Intravenous, Rapid sequence and Cricoid pressure planned  PONV Risk Score and Plan: 4 or greater and Ondansetron and Dexamethasone  Airway Management Planned: Oral ETT and Video Laryngoscope Planned  Additional Equipment:   Intra-op Plan:   Post-operative Plan: Extubation in OR and Possible Post-op intubation/ventilation  Informed Consent: I have  reviewed the patients History and Physical, chart, labs and discussed the procedure including the risks, benefits and alternatives for the proposed anesthesia with the patient or authorized representative who has indicated his/her understanding and acceptance.     Dental advisory given  Plan Discussed with: CRNA and Surgeon  Anesthesia Plan Comments:        Anesthesia Quick Evaluation

## 2023-06-20 NOTE — Anesthesia Postprocedure Evaluation (Signed)
Anesthesia Post Note  Patient: Frank [name]  Procedure(s) Performed: EXPLORATORY LAPAROTOMY DRAIN ABDOMINAL ABSCESS  Patient location during evaluation: PACU Anesthesia Type: General Level of consciousness: awake and alert and oriented Pain management: pain level controlled Vital Signs Assessment: post-procedure vital signs reviewed and stable Respiratory status: spontaneous breathing, nonlabored ventilation, respiratory function stable and patient connected to nasal cannula oxygen Cardiovascular status: blood pressure returned to baseline and stable Postop Assessment: no apparent nausea or vomiting Anesthetic complications: no  No notable events documented.   Last Vitals:  Vitals:   06/20/23 1339 06/20/23 1345  BP: 116/73 116/71  Pulse: 77 75  Resp: 20 19  Temp: 36.6 C   SpO2: 97% 97%    Last Pain:  Vitals:   06/20/23 1345  TempSrc:   PainSc: 0-No pain                 Nika Yazzie C Travion Ke

## 2023-06-20 NOTE — Assessment & Plan Note (Signed)
-  Resolved - POA: Blanchable erythematous rash on arms and legs - Try one-time dose of Benadryl and Solu-Medrol - Patient denies any pruritus or pain - Only medication is Percocet after lumbar fusion surgery - Continue to monitor

## 2023-06-20 NOTE — Op Note (Signed)
Patient:  Frank [name]  DOB:  11/11/1951  MRN:  [id number]   Preop Diagnosis: Perforated viscus, intra-abdominal abscess, localized peritonitis  Postop Diagnosis: Same, perforated colorectal diverticulitis with abscess  Procedure: Exploratory laparotomy, drainage of intra-abdominal abscess  Surgeon: [name], MD  Anes: General endotracheal  Indications: Patient is a 72 year old white male who presented to the emergency room with a multiple day history of worsening abdominal pain, nausea, and vomiting.  He was found on CT scan of the abdomen to have pneumoperitoneum and intra-abdominal abscess with associated partial small bowel obstruction.  The risks and benefits of the procedure including bleeding, infection, cardiopulmonary difficulties, bowel resection, the possibility of a colostomy were fully explained to the patient, who gave informed consent.  Procedure note: The patient was placed in the supine position.  After induction of general endotracheal anesthesia, the abdomen was prepped and draped using the usual sterile technique with ChloraPrep.  Surgical site confirmation was performed.  Midline incision was made from the umbilicus to the suprapubic region.  The peritoneal cavity was entered into without difficulty.  Upon entering the peritoneum, frank malodorous purulent fluid was found.  A sample of this was taken and sent to microbiology for aerobic and anaerobic culture.  A wound protector was applied.  The abscess came to the was arising from the pelvis and involved the mid small bowel, right lower quadrant, and colorectal juncture.  The pelvis and lower abdominal cavity were copiously irrigated with normal saline.  The small bowel was run from the ligament of Treitz to the terminal ileum.  The appendix was noted to be normal.  The ascending colon was noted to be normal.  At the colorectal juncture, it appeared that there was evidence of a diverticular perforation that had sealed.   There was no active drainage or stool emanating from this area.  The pelvis was copiously irrigated again with normal saline.  It was elected to just drain the area as no active leakage was noted.  A #10 flat Jackson-Pratt drain was placed into the pelvis and brought out through a separate stab wound to the right of the midline.  The effluent from the irrigation was clear.  The fascia was reapproximated using a looped 0 PDS running suture.  The subcutaneous layer was irrigated with normal saline.  Betadine was also applied to the subcutaneous tissue.  0.5% Sensorcaine was instilled into the surrounding wound.  The skin was closed loosely using staples.  Betadine ointment and dry sterile dressing were applied.  All tape and needle counts were correct at the end of the procedure.  The patient was extubated in the operating room and transferred to PACU in stable condition.  Complications: None  EBL: Minimal  Specimen: Deep intra-abdominal culture of abscess  Drains: Jackson-Pratt drain to the pelvis

## 2023-06-20 NOTE — Assessment & Plan Note (Addendum)
-   Potassium down to 2.4 >>2.7 - Secondary to poor p.o. intake and GI losses with nausea/vomiting - 40 mEq of potassium was given in ED, followed by 60 mill equivalent -Repleted with more potassium at magnesium this a.m. -

## 2023-06-20 NOTE — Progress Notes (Signed)
Second abdominal measurement noted at 26.5 inches, of stool like output from NG tube set to gravity. Rash noted upon admission to arms, legs, and back, rash appears more red and prominent and this time, Dr. Carren Rang aware, benadryl and solumedrol ordered.

## 2023-06-20 NOTE — Hospital Course (Signed)
Frank [name] is a 72 y.o. male with medical history significant of arthritis, GERD, CKD, hyperlipidemia, hypertension, and more presents the ED with a chief complaint of nausea and vomiting.  Patient reports that he has had nausea and vomiting since the night of the 18th.  He has vomited roughly 3 times per day.  It has been nonbloody emesis.  He denies abdominal pain but reports he has had discomfort in his abdomen.  He reports his abdomen feels bloated.  His last p.o. intake was on the 19th at 4 PM and was chicken and peaches.  He threw that up about 30 minutes after eating it.  His last bowel movement was on Tuesday or Wednesday.  He reports that is not normal for him and he has felt constipated.  He also had a fever of 102 on Tuesday or Wednesday.  Wife reports that he does not remember some of this because he felt so poorly.  She thinks his memory is worse than his baseline due to how sick he has been.  They were giving him Tylenol for his fever and it helped but his fever would come right back.  It lasted approximately 24-36 hours.  Patient reports right now he feels bloated.  He describes the discomfort as feeling too full.  Wife reports he has had constant eructation and has been requesting antacids and Rolaids.  Patient has never had a history of perforation.  He has recently had an L4-L5 fusion surgery.  It was a posterior approach.  He has no surgical pain.  Wife reports he did take Percocet on day 1 and day 2 postop.  He has not taken much Percocet since then.  He is now on postop day 9.  He has no other complaints at this time.   Patient does not smoke, does not drink.  He has been vaccinated for COVID and flu.  Patient reports that he would like to be full code.  Wife volunteers that they have both decided they would want to be DNR if terminal, but otherwise full code.  Clarification is going to be needed.   ED Temp 98, heart rate 55-82, RR 11-20, BP 102/72-120/91, satting 95% Leukocytosis  at 12.9, hemoglobin 11.8, platelets 440 Slight hyponatremia 131, hypokalemia 2.4, hypochloremia 84 BUN 42 and creatinine 1.62 UA does not indicative UTI CT abdomen pelvis shows free air in the abdomen and pelvis, with focal fluid collections in the right lower quadrant better concerning for abscess, and stranding around the colon is concerning for diverticulitis with possible perforation. General surgery consulted and plans to take patient to the OR 7/20 Neurosurgery was also consulted at the request of general surgery and they reported no need to see him Patient was given 1 L bolus, Pepcid, Flagyl, Zofran, cefepime, potassium, and NG tube was placed in the ED Admission requested for SBO, diverticulitis with possible curve, developing abscesses in the abdomen

## 2023-06-20 NOTE — Progress Notes (Signed)
Patient alert and verbal, NG tube intact draining to gravity. Honeycomb dressing intact no drainage noted, JP to right side of abdomen output 10 mL bloody in color. Patient verbalized some complaints of pain to abdomen PRN given, see MAR.

## 2023-06-20 NOTE — Anesthesia Procedure Notes (Signed)
Procedure Name: Intubation Date/Time: 06/20/2023 12:22 PM  Performed by: Molli Barrows, MDPre-anesthesia Checklist: Patient identified, Emergency Drugs available, Suction available and Patient being monitored Patient Re-evaluated:Patient Re-evaluated prior to induction Oxygen Delivery Method: Circle system utilized Preoxygenation: Pre-oxygenation with 100% oxygen Induction Type: IV induction, Cricoid Pressure applied and Rapid sequence Laryngoscope Size: Glidescope and 4 Grade View: Grade I Tube type: Oral Tube size: 7.5 mm Number of attempts: 1 Airway Equipment and Method: Stylet Placement Confirmation: ETT inserted through vocal cords under direct vision, positive ETCO2 and breath sounds checked- equal and bilateral Secured at: 23 (lips) cm Tube secured with: Tape Dental Injury: Teeth and Oropharynx as per pre-operative assessment

## 2023-06-20 NOTE — Progress Notes (Signed)
Third abdominal measurement noted at 26 inches. of stool like output still in foley bag set to gravity. Pt not complaining of any pain or discomfort at this time. Plan of care ongoing.

## 2023-06-20 NOTE — Transfer of Care (Signed)
Immediate Anesthesia Transfer of Care Note  Patient: Frank [name]  Procedure(s) Performed: EXPLORATORY LAPAROTOMY DRAIN ABDOMINAL ABSCESS  Patient Location: PACU  Anesthesia Type:General  Level of Consciousness: awake, alert , and oriented  Airway & Oxygen Therapy: Patient Spontanous Breathing and Patient connected to nasal cannula oxygen  Post-op Assessment: Report given to RN and Post -op Vital signs reviewed and stable  Post vital signs: Reviewed and stable  Last Vitals:  Vitals Value Taken Time  BP 116/71 06/20/23 1345  Temp 36.6 C 06/20/23 1339  Pulse 72 06/20/23 1351  Resp 15 06/20/23 1351  SpO2 97 % 06/20/23 1351  Vitals shown include unfiled device data.  Last Pain:  Vitals:   06/20/23 1345  TempSrc:   PainSc: 0-No pain         Complications: No notable events documented.

## 2023-06-20 NOTE — Assessment & Plan Note (Addendum)
-   As seen on CT - Transition zone in the right lower quadrant -Status post exploratory laparotomy, -Postop day #1 -Pending return of bowel function

## 2023-06-20 NOTE — Assessment & Plan Note (Addendum)
-   Creatinine up to 1.62 from 1.25 Lab Results  Component Value Date   CREATININE 1.37 (H) 06/20/2023   CREATININE 1.62 (H) 06/19/2023   CREATININE 1.25 (H) 05/28/2023   -Continue IV fluid resuscitation - Trend creatinine  - Avoid nephrotoxic agents when possible

## 2023-06-20 NOTE — Interval H&P Note (Signed)
History and Physical Interval Note:  06/20/2023 11:50 AM  Frank [name]  has presented today for surgery, with the diagnosis of perforated viscus.  The various methods of treatment have been discussed with the patient and family. After consideration of risks, benefits and other options for treatment, the patient has consented to  Procedure(s): EXPLORATORY LAPAROTOMY (N/A) as a surgical intervention.  The patient's history has been reviewed, patient examined, no change in status, stable for surgery.  I have reviewed the patient's chart and labs.  Questions were answered to the patient's satisfaction.     [name]

## 2023-06-20 NOTE — Consult Note (Signed)
Reason for Consult: Pneumoperitoneum, perforated viscus, intra-abdominal abscess Referring Physician: Dr. Vira [name] is an 72 y.o. male.  HPI: Patient is a 72 year old white male status post lumbar laminectomy on 06/11/2023 who presents with with a 3 to 4-day history of worsening abdominal pain, nausea, vomiting.  He presented to the emergency room and was found to have a small amount of pneumoperitoneum, sigmoid colitis, and the partial small bowel obstruction with intra-abdominal abscess.  He has never had abdominal surgery.  He was admitted to the hospitalist for further evaluation and treatment.  He was noted to be severely hypokalemic at 2.4.  Past Medical History:  Diagnosis Date   Arthritis    Barrett's esophagus    BPH (benign prostatic hyperplasia)    Chronic kidney disease    Fracture of 5th metatarsal    GERD (gastroesophageal reflux disease)    Hypercholesteremia    Hypertension    Mastoiditis    Pre-diabetes    Ulcer disease    Bulbar    Past Surgical History:  Procedure Laterality Date   BACK SURGERY     BIOPSY  12/12/2021   Procedure: BIOPSY;  Surgeon: Malissa Hippo, MD;  [facility];  Service: Endoscopy;;   COLONOSCOPY     COLONOSCOPY     COLONOSCOPY N/A 08/27/2016   Procedure: COLONOSCOPY;  Surgeon: Malissa Hippo, MD;  [facility];  Service: Endoscopy;  Laterality: N/A;  930 - moved to 9/27 @ 12:00 - Ann notified pt   COLONOSCOPY N/A 12/12/2021   Procedure: COLONOSCOPY;  Surgeon: Malissa Hippo, MD;  [facility];  Service: Endoscopy;  Laterality: N/A;  730   HEMORRHOID SURGERY  12/02/2007   INGUINAL HERNIA REPAIR Right 10/15/2020   Procedure: OPEN RIGHT INGUINAL HERNIA REPAIR WITH MESH;  Surgeon: Ovidio Kin, MD;  Location: [facility] OR;  Service: General;  Laterality: Right;   INSERTION OF MESH Right 10/15/2020   Procedure: INSERTION OF MESH;  Surgeon: Ovidio Kin, MD;  Location: [facility] OR;  Service: General;   Laterality: Right;   KNEE ARTHROSCOPY     Right Knee   ORIF TOE FRACTURE Left 12/09/2013   Procedure: LEFT OPEN REDUCTION INTERNAL FIXATION (ORIF) FIFTH METATARSAL FRACTURE;  Surgeon: Nilda Simmer, MD;  Location: [facility];  Service: Orthopedics;  Laterality: Left;   POLYPECTOMY  08/27/2016   Procedure: POLYPECTOMY;  Surgeon: Malissa Hippo, MD;  [facility];  Service: Endoscopy;;  colon   POLYPECTOMY  12/12/2021   Procedure: POLYPECTOMY;  Surgeon: Malissa Hippo, MD;  [facility];  Service: Endoscopy;;   rotor cuff  12/02/2007   right-   TONSILLECTOMY     UPPER GASTROINTESTINAL ENDOSCOPY      Family History  Problem Relation Age of Onset   COPD Mother    Heart disease Father    Healthy Sister    Lung cancer Brother    Heart disease Brother    Heart disease Sister    Prostate cancer Brother    Healthy Son    Healthy Son     Social History:  reports that he has never smoked. He has never used smokeless tobacco. He reports that he does not drink alcohol and does not use drugs.  Allergies:  Allergies  Allergen Reactions   Metformin    Ampicillin Rash    Medications: I have reviewed the patient's current medications. Prior to Admission:  Medications Prior to Admission  Medication  Sig Dispense Refill Last Dose   amLODipine (NORVASC) 5 MG tablet Take 5 mg by mouth daily.      aspirin EC 81 MG tablet Take 1 tablet (81 mg total) by mouth daily. Swallow whole. 30 tablet 11    chlorthalidone (HYGROTON) 25 MG tablet Take 25 mg by mouth daily.      cyclobenzaprine (FLEXERIL) 5 MG tablet Take 1 tablet (5 mg total) by mouth 3 (three) times daily as needed for muscle spasms. 50 tablet 0    docusate sodium (COLACE) 100 MG capsule Take 1 capsule (100 mg total) by mouth 2 (two) times daily. 30 capsule 0    ezetimibe (ZETIA) 10 MG tablet Take 10 mg by mouth at bedtime.      Ginger, Zingiber officinalis, (GINGER PO) Take 1 capsule by mouth  daily.      Glucosamine HCl 1000 MG TABS Take 2,000 mg by mouth daily.      losartan (COZAAR) 100 MG tablet Take 100 mg by mouth daily.      Misc Natural Products (TART CHERRY ADVANCED PO) Take 1 tablet by mouth daily.      Multiple Vitamin (MULTIVITAMIN WITH MINERALS) TABS tablet Take 1 tablet by mouth daily.      Omega-3 Fatty Acids (FISH OIL) 1000 MG CAPS Take 1,000 mg by mouth daily.      omeprazole (PRILOSEC) 20 MG capsule Take 20 mg by mouth daily.       oxyCODONE-acetaminophen (PERCOCET/ROXICET) 5-325 MG tablet Take 1-2 tablets by mouth every 4 (four) hours as needed for moderate pain. 30 tablet 0    pioglitazone (ACTOS) 15 MG tablet Take 15 mg by mouth daily.      TURMERIC PO Take 1 capsule by mouth daily.       Results for orders placed or performed during the hospital encounter of 06/19/23 (from the past 48 hour(s))  Lipase, blood     Status: None   Collection Time: 06/19/23  8:42 PM  Result Value Ref Range   Lipase 23 11 - 51 U/L    Comment: Performed at [facility], [address], [place], [place] [address]  Comprehensive metabolic panel     Status: Abnormal   Collection Time: 06/19/23  8:42 PM  Result Value Ref Range   Sodium 131 (L) 135 - 145 mmol/L   Potassium 2.4 (LL) 3.5 - 5.1 mmol/L    Comment: CRITICAL RESULT CALLED TO, READ BACK BY AND VERIFIED WITH [place]EN[place],B ON 06/19/23 AT 2130 BY LOY,C   Chloride 84 (L) 98 - 111 mmol/L   CO2 32 22 - 32 mmol/L   Glucose, Bld 162 (H) 70 - 99 mg/dL    Comment: Glucose reference range applies only to samples taken after fasting for at least 8 hours.   BUN 42 (H) 8 - 23 mg/dL   Creatinine, Ser 6.04 (H) 0.61 - 1.24 mg/dL   Calcium 9.1 8.9 - 54.0 mg/dL   Total Protein 7.4 6.5 - 8.1 g/dL   Albumin 3.1 (L) 3.5 - 5.0 g/dL   AST 33 15 - 41 U/L   ALT 32 0 - 44 U/L   Alkaline Phosphatase 59 38 - 126 U/L   Total Bilirubin 1.7 (H) 0.3 - 1.2 mg/dL   GFR, Estimated 45 (L) >60 mL/min    Comment: (NOTE) Calculated using the CKD-EPI  Creatinine Equation (2021)    Anion gap 15 5 - 15    Comment: Performed at [facility], [address] [place], [place]  [address]  CBC     Status: Abnormal   Collection Time: 06/19/23  8:42 PM  Result Value Ref Range   WBC 12.9 (H) 4.0 - 10.5 K/uL   RBC 3.98 (L) 4.22 - 5.81 MIL/uL   Hemoglobin 11.8 (L) 13.0 - 17.0 g/dL   HCT 82.9 (L) 56.2 - 13.0 %   MCV 85.7 80.0 - 100.0 fL   MCH 29.6 26.0 - 34.0 pg   MCHC 34.6 30.0 - 36.0 g/dL   RDW 86.5 78.4 - 69.6 %   Platelets 440 (H) 150 - 400 K/uL   nRBC 0.0 0.0 - 0.2 %    Comment: Performed at [facility], [address], [place], [place] [address]  Lactic acid, plasma     Status: None   Collection Time: 06/19/23  8:42 PM  Result Value Ref Range   Lactic Acid, Venous 1.3 0.5 - 1.9 mmol/L    Comment: Performed at [facility] [address], [place], [place] [address]  Magnesium     Status: None   Collection Time: 06/19/23  8:42 PM  Result Value Ref Range   Magnesium 2.0 1.7 - 2.4 mg/dL    Comment: Performed at [facility], [address], [place], [place] [address]  Urinalysis, Routine w reflex microscopic -Urine, Clean Catch     Status: Abnormal   Collection Time: 06/19/23  9:50 PM  Result Value Ref Range   Color, Urine YELLOW YELLOW   APPearance HAZY (A) CLEAR   Specific Gravity, Urine 1.018 1.005 - 1.030   pH 5.0 5.0 - 8.0   Glucose, UA NEGATIVE NEGATIVE mg/dL   Hgb urine dipstick MODERATE (A) NEGATIVE   Bilirubin Urine NEGATIVE NEGATIVE   Ketones, ur NEGATIVE NEGATIVE mg/dL   Protein, ur 30 (A) NEGATIVE mg/dL   Nitrite NEGATIVE NEGATIVE   Leukocytes,Ua NEGATIVE NEGATIVE   RBC / HPF 11-20 0 - 5 RBC/hpf   WBC, UA 0-5 0 - 5 WBC/hpf   Bacteria, UA NONE SEEN NONE SEEN   Squamous Epithelial / HPF 0-5 0 - 5 /HPF   Mucus PRESENT     Comment: Performed at [facility], [address] [place], [place] [address]  Potassium     Status: Abnormal   Collection Time: 06/20/23  1:21 AM  Result Value Ref Range   Potassium 2.7 (LL) 3.5 -  5.1 mmol/L    Comment: CRITICAL RESULT CALLED TO, READ BACK BY AND VERIFIED WITH CLINTON,A @ 0146 ON 06/20/23 BY JUW Performed at [facility], [address], [place], [place] [address]   Comprehensive metabolic panel     Status: Abnormal   Collection Time: 06/20/23  4:13 AM  Result Value Ref Range   Sodium 131 (L) 135 - 145 mmol/L   Potassium 2.7 (LL) 3.5 - 5.1 mmol/L    Comment: CRITICAL RESULT CALLED TO, READ BACK BY AND VERIFIED WITH TARA @ 0723 ON [id number] BY HENDERSON L   Chloride 88 (L) 98 - 111 mmol/L   CO2 33 (H) 22 - 32 mmol/L   Glucose, Bld 136 (H) 70 - 99 mg/dL    Comment: Glucose reference range applies only to samples taken after fasting for at least 8 hours.   BUN 38 (H) 8 - 23 mg/dL   Creatinine, Ser 5.63 (H) 0.61 - 1.24 mg/dL   Calcium 8.9 8.9 - 87.5 mg/dL   Total Protein 6.5 6.5 - 8.1 g/dL   Albumin 2.7 (L) 3.5 - 5.0 g/dL   AST 26 15 - 41  U/L   ALT 25 0 - 44 U/L   Alkaline Phosphatase 52 38 - 126 U/L   Total Bilirubin 1.0 0.3 - 1.2 mg/dL   GFR, Estimated 54 (L) >60 mL/min    Comment: (NOTE) Calculated using the CKD-EPI Creatinine Equation (2021)    Anion gap 10 5 - 15    Comment: Performed at [facility], [address] [place], [place] [address]  Magnesium     Status: None   Collection Time: 06/20/23  4:13 AM  Result Value Ref Range   Magnesium 2.0 1.7 - 2.4 mg/dL    Comment: Performed at [facility], [address] [place], [place] [address]  CBC with Differential/Platelet     Status: Abnormal   Collection Time: 06/20/23  4:13 AM  Result Value Ref Range   WBC 12.1 (H) 4.0 - 10.5 K/uL   RBC 3.56 (L) 4.22 - 5.81 MIL/uL   Hemoglobin 10.5 (L) 13.0 - 17.0 g/dL   HCT 10.2 (L) 72.5 - 36.6 %   MCV 87.9 80.0 - 100.0 fL   MCH 29.5 26.0 - 34.0 pg   MCHC 33.5 30.0 - 36.0 g/dL   RDW 44.0 34.7 - 42.5 %   Platelets 408 (H) 150 - 400 K/uL   nRBC 0.0 0.0 - 0.2 %   Neutrophils Relative % 85 %   Neutro Abs 10.3 (H) 1.7 - 7.7 K/uL   Lymphocytes Relative 6 %   Lymphs  Abs 0.7 0.7 - 4.0 K/uL   Monocytes Relative 6 %   Monocytes Absolute 0.8 0.1 - 1.0 K/uL   Eosinophils Relative 2 %   Eosinophils Absolute 0.2 0.0 - 0.5 K/uL   Basophils Relative 0 %   Basophils Absolute 0.0 0.0 - 0.1 K/uL   Immature Granulocytes 1 %   Abs Immature Granulocytes 0.07 0.00 - 0.07 K/uL    Comment: Performed at [facility], [address], [place], [place] [address]    DG Chest Portable 1 View  Result Date: 06/20/2023 CLINICAL DATA:  NG placement. EXAM: PORTABLE CHEST 1 VIEW COMPARISON:  Chest radiograph dated 06/19/2023. FINDINGS: Enteric tube with side-port in the proximal stomach and tip beyond the inferior margin of the image. No interval change in the appearance of the lungs since the earlier radiograph. Trace pneumoperitoneum suspected under the right hemidiaphragm. No acute osseous pathology. IMPRESSION: 1. Enteric tube with side-port in the proximal stomach and tip beyond the inferior margin of the image. 2. Trace pneumoperitoneum suspected under the right hemidiaphragm. Electronically Signed   By: Elgie Collard M.D.   On: 06/20/2023 02:24   DG ABD ACUTE 2+V W 1V CHEST  Addendum Date: 06/19/2023   ADDENDUM REPORT: 06/19/2023 23:52 ADDENDUM: These results were called by telephone at the time of interpretation on 06/19/2023 at 10:56 p.m. to provider Dr. Audrie Lia, who verbally acknowledged these results. Electronically Signed   By: Darliss Cheney M.D.   On: 06/19/2023 23:52   Result Date: 06/19/2023 CLINICAL DATA:  Postoperative fever EXAM: DG ABDOMEN ACUTE WITH 1 VIEW CHEST COMPARISON:  CT abdomen and pelvis 09/05/2020 FINDINGS: The lungs are clear.  There is no pleural effusion or pneumothorax. There is a small amount of free air under the right hemidiaphragm. There are dilated small bowel loops in the central abdomen measuring up to 6 cm. The colon appears nondilated. There are no suspicious calcifications identified. Lower lumbar fusion hardware is unremarkable. No acute  fractures are seen. IMPRESSION: 1. Small amount of free air under the right hemidiaphragm.  2. Dilated small bowel loops in the central abdomen measuring up to 6 cm. Findings are concerning for small bowel obstruction. 3. No acute cardiopulmonary process. Electronically Signed: By: Darliss Cheney M.D. On: 06/19/2023 21:49   CT ABDOMEN PELVIS W CONTRAST  Result Date: 06/19/2023 CLINICAL DATA:  Constipation, emesis. Bowel obstruction suspected. Back surgery last Thursday, July 11th. EXAM: CT ABDOMEN AND PELVIS WITH CONTRAST TECHNIQUE: Multidetector CT imaging of the abdomen and pelvis was performed using the standard protocol following bolus administration of intravenous contrast. RADIATION DOSE REDUCTION: This exam was performed according to the departmental dose-optimization program which includes automated exposure control, adjustment of the mA and/or kV according to patient size and/or use of iterative reconstruction technique. CONTRAST:  80mL OMNIPAQUE IOHEXOL 300 MG/ML  SOLN COMPARISON:  09/05/2020. FINDINGS: Lower chest: Heart is normal in size and there is no pericardial effusion. Scattered coronary artery calcifications are noted. Mild atelectasis is present at the lung bases. Hepatobiliary: A cyst is present in the left lobe of the liver. No biliary ductal dilatation. Gallbladder is without stones. Pancreas: Unremarkable. No pancreatic ductal dilatation or surrounding inflammatory changes. Spleen: Normal in size without focal abnormality. Adrenals/Urinary Tract: No adrenal nodule or mass. The kidneys enhance symmetrically. Subcentimeter hypodensities are present bilaterally which are too small to further characterize. Nonobstructive renal calculi are noted bilaterally. No ureteral calculus or obstructive uropathy. There is diffuse bladder wall thickening. Stomach/Bowel: The stomach is within normal limits. Multiple distended loops of small bowel are noted in the abdomen with air-fluid levels measuring up  to 3.7 cm. A transition point is noted in the right lower quadrant with decompression of the distal small bowel, axial image 79. Multiple diverticula are present along the colon. There is bowel wall thickening at the sigmoid colon with mild surrounding inflammatory changes in the pelvis. The appendix appears normal. Vascular/Lymphatic: Aortic atherosclerosis. No enlarged abdominal or pelvic lymph nodes. Reproductive: Prostate gland is mildly enlarged. Other: Small amount of free fluid with foci of air are noted in the pelvis. There is a loculated fluid collection with air in the right lower quadrant measuring 4.0 x 2.3 cm, adjacent to the region of bowel obstruction. A moderate amount of free air is noted in the abdomen and in a fat containing umbilical hernia. Musculoskeletal: Anterior and posterior lumbar spinal fusion hardware is noted from L4-L5. Degenerative changes are noted in the thoracolumbar spine. IMPRESSION: 1. Status post anterior and posterior lumbar spinal fusion hardware from L4-L5. 2. Moderate amount of free air in the abdomen and pelvis, more than anticipated for postoperative time. Findings are concerning for possible bowel perforation. Surgical consultation is recommended. 3. Focal fluid collections in the right lower quadrant and pelvis containing air, concerning for developing abscess. Dilated loops of small bowel in the abdomen and pelvis with a transition point in the right lower quadrant adjacent to a fluid collection and decompression of the small bowel distal to this point, concerning for small bowel obstruction. 4. Colonic diverticulosis with bowel wall thickening and fat stranding at the sigmoid colon, possible diverticulitis versus local inflammatory changes. 5. Bladder wall thickening, suggesting infectious or inflammatory cystitis. 6. Bilateral nephrolithiasis. 7. Coronary artery calcifications and aortic atherosclerosis. 8. Remaining incidental findings as described above. Critical  Value/emergent results were called by telephone at the time of interpretation on 06/19/2023 at 10:55 pm to provider Dr. Audrie Lia, who verbally acknowledged these results. Electronically Signed   By: Thornell Sartorius M.D.   On: 06/19/2023 22:56   DG Shoulder Left  Result Date: 06/19/2023 CLINICAL DATA:  Postoperative fever and constipation. Shoulder pain after recent fall. Stomach pain, vomiting, and nausea. EXAM: LEFT SHOULDER - 2+ VIEW COMPARISON:  None Available. FINDINGS: Degenerative changes in the glenohumeral joint with mild glenoid osteophyte formation. No evidence of acute fracture or dislocation. No focal bone lesion or bone destruction. Soft tissues are unremarkable. IMPRESSION: Mild degenerative changes in the glenohumeral joint. No acute bony abnormalities. Electronically Signed   By: Burman Nieves M.D.   On: 06/19/2023 21:49    ROS:  Pertinent items are noted in HPI.  Blood pressure 118/80, pulse 75, temperature 98.3 F (36.8 C), temperature source Oral, resp. rate 17, height 5\' 11"  (1.803 m), weight 86.3 kg, SpO2 96%. Physical Exam: Pleasant white male who appears uncomfortable and fatigued Head is normocephalic, atraumatic Lungs clear to auscultation with equal breath sounds bilaterally Heart examination reveals a regular rate and rhythm without S3, S4, murmurs Abdomen is soft with nonspecific tenderness throughout.  No rigidity is noted.  No active bowel sounds appreciated.  CT scan images personally reviewed  Assessment/Plan: Impression: Perforated viscus with intra-abdominal abscess.  Etiologies include small bowel perforation versus perforated sigmoid diverticulitis with involvement of the small bowel. Plan: Patient will be taken to the operating room today for an exploratory laparotomy, possible bowel resection, possible colostomy.  The risks and benefits of the procedure including bleeding, infection, cardiopulmonary difficulties, and the possibility of a temporary colostomy  were fully explained to the patient, who gave informed consent.  Will continue supplementing potassium prior to surgery.  Frank [name] 06/20/2023, 8:37 AM

## 2023-06-20 NOTE — Assessment & Plan Note (Addendum)
-   Postop day #1 -Status post exploratory laparotomy with draining of-intra-abdominal abscess -Tolerated surgery well -Medically stable, n.p.o. NG tube still in place  -Continuing IV fluids, as needed IV analgesics -Surgery following -Reporting of no gas or bowel movement yet  ------------------------------------------------------------------------------------------------------------- - CT abdomen pelvis:  shows status post anterior and posterior lumbar spinal fusion hardware from L4-L5 Free air in the abdomen and pelvis more than anticipated for postoperative time concerning for bowel perforation  Focal fluid collections in the right lower quadrant and pelvis containing air concerning for developing abscess Small bowel obstruction with transition zone in the right lower quadrant - Stranding of the sigmoid colon possible diverticulitis   - Continue on Rocephin and Flagyl  - Pain control with morphine - Continue IV fluids - Associated leukocytosis of 12.9  - NG tube in place to gravity as ordered prior to admission - Continue to monitor

## 2023-06-20 NOTE — Progress Notes (Signed)
Patient was noted to have a rash all over his body. Dr wanted rash marked. I noted that from the time he was admitted to the floor to the time the doctor wanted the rash marked that the rash had spread further. In his flank areas, he is very red and warm to touch. He is red in his groin and under arm areas. These areas the rash extends down the extremities. He also has a rash across the lower abdomen and on both lower extremities. Some areas look like petechia. The wife said these rashes started prior to admission but continue to grow.

## 2023-06-20 NOTE — Progress Notes (Signed)
PROGRESS NOTE    Patient: Frank [name]                            PCP: Carylon Perches, MD                    DOB: [birth year]            DOA: 06/19/2023 ZOX:[id number]             DOS: 06/20/2023, 11:24 AM   LOS: 0 days   Date of Service: The patient was seen and examined on 06/20/2023  Subjective:   The patient was seen and examined this morning. Hemodynamically stable. NG tube in place NOP Reporting improved abdominal pain with IV analgesics    Brief Narrative:    ARPAN ESKELSON is a 72 y.o. male with medical history significant of arthritis, GERD, CKD, hyperlipidemia, hypertension, and more presents the ED with a chief complaint of nausea and vomiting.  Patient reports that he has had nausea and vomiting since the night of the 18th.  He has vomited roughly 3 times per day.  It has been nonbloody emesis.  He denies abdominal pain but reports he has had discomfort in his abdomen.  He reports his abdomen feels bloated.  His last p.o. intake was on the 19th at 4 PM and was chicken and peaches.  He threw that up about 30 minutes after eating it.  His last bowel movement was on Tuesday or Wednesday.  He reports that is not normal for him and he has felt constipated.  He also had a fever of 102 on Tuesday or Wednesday.  Wife reports that he does not remember some of this because he felt so poorly.  She thinks his memory is worse than his baseline due to how sick he has been.  They were giving him Tylenol for his fever and it helped but his fever would come right back.  It lasted approximately 24-36 hours.  Patient reports right now he feels bloated.  He describes the discomfort as feeling too full.  Wife reports he has had constant eructation and has been requesting antacids and Rolaids.  Patient has never had a history of perforation.  He has recently had an L4-L5 fusion surgery.  It was a posterior approach.  He has no surgical pain.  Wife reports he did take Percocet on day 1 and day 2 postop.  He  has not taken much Percocet since then.  He is now on postop day 9.  He has no other complaints at this time.   Patient does not smoke, does not drink.  He has been vaccinated for COVID and flu.  Patient reports that he would like to be full code.  Wife volunteers that they have both decided they would want to be DNR if terminal, but otherwise full code.  Clarification is going to be needed.   ED Temp 98, heart rate 55-82, RR 11-20, BP 102/72-120/91, satting 95% Leukocytosis at 12.9, hemoglobin 11.8, platelets 440 Slight hyponatremia 131, hypokalemia 2.4, hypochloremia 84 BUN 42 and creatinine 1.62 UA does not indicative UTI CT abdomen pelvis shows free air in the abdomen and pelvis, with focal fluid collections in the right lower quadrant better concerning for abscess, and stranding around the colon is concerning for diverticulitis with possible perforation. General surgery consulted and plans to take patient to the OR 7/20 Neurosurgery was also consulted at the request  of general surgery and they reported no need to see him Patient was given 1 L bolus, Pepcid, Flagyl, Zofran, cefepime, potassium, and NG tube was placed in the ED Admission requested for SBO, diverticulitis with possible curve, developing abscesses in the abdomen    Assessment & Plan:   Principal Problem:   Diverticulitis of large intestine with abscess Active Problems:   GERD (gastroesophageal reflux disease)   HTN (hypertension)   Sinus bradycardia   Hypokalemia   AKI (acute kidney injury) (HCC)   Rash   SBO (small bowel obstruction) (HCC)   Perforated abdominal viscus     Assessment and Plan: * Diverticulitis of large intestine with abscess - N.p.o. -Continuing IV fluids, as needed IV analgesics -Surgery consult, appreciate further elevation and input  - CT abdomen pelvis:  shows status post anterior and posterior lumbar spinal fusion hardware from L4-L5 Free air in the abdomen and pelvis more than  anticipated for postoperative time concerning for bowel perforation  Focal fluid collections in the right lower quadrant and pelvis containing air concerning for developing abscess Small bowel obstruction with transition zone in the right lower quadrant - Stranding of the sigmoid colon possible diverticulitis   - Patient started on cefepime and Flagyl in the ED - Continue on Rocephin and Flagyl  - Pain control with morphine - Continue IV fluids - Associated leukocytosis of 12.9  - NG tube in place to gravity as ordered prior to admission - Continue to monitor  SBO (small bowel obstruction) (HCC) - As seen on CT - Transition zone in the right lower quadrant - NG tube in place- Monitoring output - General surgery consulted-- ?OR today  - N.p.o. except for ice chips for comfort - Pain control morphine  - Continue to monitor  Rash - Blanchable erythematous rash on arms and legs - Has been there for several days - Try one-time dose of Benadryl and Solu-Medrol - Patient denies any pruritus or pain - Only medication is Percocet after lumbar fusion surgery - Continue to monitor  AKI (acute kidney injury) (HCC) - Creatinine up to 1.62 from 1.25 Lab Results  Component Value Date   CREATININE 1.37 (H) 06/20/2023   CREATININE 1.62 (H) 06/19/2023   CREATININE 1.25 (H) 05/28/2023   -Continue IV fluid resuscitation - Trend creatinine  - Avoid nephrotoxic agents when possible  Hypokalemia - Potassium down to 2.4 >>2.7 - Secondary to poor p.o. intake and GI losses with nausea/vomiting - 40 mEq of potassium was given in ED, followed by 60 mill equivalent -Repleted with more potassium at magnesium this a.m. -  Sinus bradycardia - Heart rate as low as 55 recorded in the ED - Likely related to electrolyte abnormalities - Monitor and correct electrolytes as indicated - Continue to monitor on telemetry  HTN (hypertension) - Resume Norvasc, chlorthalidone, losartan when patient is  able to tolerate p.o. -N.p.o. at this point, utilizing as needed IV hydralazine  GERD (gastroesophageal reflux disease) - Resume Prilosec when patient is able to tolerate p.o. - In the meantime IV Protonix   -------------------------------------------------------------------------------------------------------------------------------- Nutritional status:  The patient's BMI is: Body mass index is 26.54 kg/m. I agree with the assessment and plan as outlined --------------------------------------------------------------------------------------------------------------------------------  DVT prophylaxis:  SCDs Start: 06/20/23 0451   Code Status:   Code Status: Full Code  Family Communication:-Other family members present at bedside, updated The above findings and plan of care has been discussed with patient (and family)  in detail,  they expressed understanding and agreement of  above. -Advance care planning has been discussed.   Admission status:   Status is: Inpatient Remains inpatient appropriate because: Need to be n.p.o., IV fluids, needing surgical evaluation for possible surgical invention for small bowel obstruction   Disposition: From  - home             Planning for discharge in 1-2 days: to   Procedures:   No admission procedures for hospital encounter.   Antimicrobials:  Anti-infectives (From admission, onward)    Start     Dose/Rate Route Frequency Ordered Stop   06/20/23 1100  metroNIDAZOLE (FLAGYL) IVPB 500 mg        500 mg 100 mL/hr over 60 Minutes Intravenous Every 12 hours 06/20/23 0450     06/20/23 0800  cefTRIAXone (ROCEPHIN) 2 g in sodium chloride 0.9 % 100 mL IVPB        2 g 200 mL/hr over 30 Minutes Intravenous Every 24 hours 06/20/23 0450     06/19/23 2300  metroNIDAZOLE (FLAGYL) IVPB 500 mg        500 mg 100 mL/hr over 60 Minutes Intravenous  Once 06/19/23 2257 06/20/23 0010   06/19/23 2300  ceFEPIme (MAXIPIME) 2 g in sodium chloride 0.9 % 100 mL  IVPB        2 g 200 mL/hr over 30 Minutes Intravenous  Once 06/19/23 2259 06/19/23 2341        Medication:   pantoprazole (PROTONIX) IV  40 mg Intravenous Q24H    acetaminophen **OR** acetaminophen, morphine injection, ondansetron **OR** ondansetron (ZOFRAN) IV, phenol   Objective:   Vitals:   06/20/23 0009 06/20/23 0100 06/20/23 0256 06/20/23 1056  BP: 116/81 114/79 118/80 124/78  Pulse: 76 74 75 77  Resp: 11 16 17 20   Temp:   98.3 F (36.8 C) 98.1 F (36.7 C)  TempSrc:   Oral Oral  SpO2: 91% 95% 96% 95%  Weight:   86.3 kg   Height:        Intake/Output Summary (Last 24 hours) at 06/20/2023 1124 Last data filed at 06/20/2023 1610 Gross per 24 hour  Intake --  Output 1150 ml  Net -1150 ml   Filed Weights   06/19/23 2029 06/20/23 0256  Weight: 87 kg 86.3 kg     Physical examination:   Constitution:  Alert, cooperative, no distress,  Appears calm and comfortable  Psychiatric:   Normal and stable mood and affect, cognition intact,   HEENT:        Normocephalic, PERRL, otherwise with in Normal limits  Chest:         Chest symmetric Cardio vascular:  S1/S2, RRR, No murmure, No Rubs or Gallops  pulmonary: Clear to auscultation bilaterally, respirations unlabored, negative wheezes / crackles Abdomen: Soft, non-tender, non-distended, bowel sounds,no masses, no organomegaly Muscular skeletal: Limited exam - in bed, able to move all 4 extremities,   Neuro: CNII-XII intact. , normal motor and sensation, reflexes intact  Extremities: No pitting edema lower extremities, +2 pulses  Skin: Dry, warm to touch, negative for any Rashes, No open wounds Wounds: per nursing documentation   ------------------------------------------------------------------------------------------------------------------------------------------    LABs:     Latest Ref Rng & Units 06/20/2023    4:13 AM 06/19/2023    8:42 PM 05/28/2023    2:00 PM  CBC  WBC 4.0 - 10.5 K/uL 12.1  12.9  6.5    Hemoglobin 13.0 - 17.0 g/dL 96.0  45.4  09.8   Hematocrit 39.0 - 52.0 % 31.3  34.1  43.2   Platelets 150 - 400 K/uL 408  440  315       Latest Ref Rng & Units 06/20/2023    4:13 AM 06/20/2023    1:21 AM 06/19/2023    8:42 PM  CMP  Glucose 70 - 99 mg/dL 562   130   BUN 8 - 23 mg/dL 38   42   Creatinine 8.65 - 1.24 mg/dL 7.84   6.96   Sodium 295 - 145 mmol/L 131   131   Potassium 3.5 - 5.1 mmol/L 2.7  2.7  2.4   Chloride 98 - 111 mmol/L 88   84   CO2 22 - 32 mmol/L 33   32   Calcium 8.9 - 10.3 mg/dL 8.9   9.1   Total Protein 6.5 - 8.1 g/dL 6.5   7.4   Total Bilirubin 0.3 - 1.2 mg/dL 1.0   1.7   Alkaline Phos 38 - 126 U/L 52   59   AST 15 - 41 U/L 26   33   ALT 0 - 44 U/L 25   32        Micro Results No results found for this or any previous visit (from the past 240 hour(s)).  Radiology Reports DG Chest Portable 1 View  Result Date: 06/20/2023 CLINICAL DATA:  NG placement. EXAM: PORTABLE CHEST 1 VIEW COMPARISON:  Chest radiograph dated 06/19/2023. FINDINGS: Enteric tube with side-port in the proximal stomach and tip beyond the inferior margin of the image. No interval change in the appearance of the lungs since the earlier radiograph. Trace pneumoperitoneum suspected under the right hemidiaphragm. No acute osseous pathology. IMPRESSION: 1. Enteric tube with side-port in the proximal stomach and tip beyond the inferior margin of the image. 2. Trace pneumoperitoneum suspected under the right hemidiaphragm. Electronically Signed   By: Elgie Collard M.D.   On: 06/20/2023 02:24   DG ABD ACUTE 2+V W 1V CHEST  Addendum Date: 06/19/2023   ADDENDUM REPORT: 06/19/2023 23:52 ADDENDUM: These results were called by telephone at the time of interpretation on 06/19/2023 at 10:56 p.m. to provider Dr. Audrie Lia, who verbally acknowledged these results. Electronically Signed   By: Darliss Cheney M.D.   On: 06/19/2023 23:52   Result Date: 06/19/2023 CLINICAL DATA:  Postoperative fever EXAM: DG ABDOMEN  ACUTE WITH 1 VIEW CHEST COMPARISON:  CT abdomen and pelvis 09/05/2020 FINDINGS: The lungs are clear.  There is no pleural effusion or pneumothorax. There is a small amount of free air under the right hemidiaphragm. There are dilated small bowel loops in the central abdomen measuring up to 6 cm. The colon appears nondilated. There are no suspicious calcifications identified. Lower lumbar fusion hardware is unremarkable. No acute fractures are seen. IMPRESSION: 1. Small amount of free air under the right hemidiaphragm. 2. Dilated small bowel loops in the central abdomen measuring up to 6 cm. Findings are concerning for small bowel obstruction. 3. No acute cardiopulmonary process. Electronically Signed: By: Darliss Cheney M.D. On: 06/19/2023 21:49   CT ABDOMEN PELVIS W CONTRAST  Result Date: 06/19/2023 CLINICAL DATA:  Constipation, emesis. Bowel obstruction suspected. Back surgery last Thursday, July 11th. EXAM: CT ABDOMEN AND PELVIS WITH CONTRAST TECHNIQUE: Multidetector CT imaging of the abdomen and pelvis was performed using the standard protocol following bolus administration of intravenous contrast. RADIATION DOSE REDUCTION: This exam was performed according to the departmental dose-optimization program which includes automated exposure control, adjustment of the mA and/or kV according to patient size and/or use  of iterative reconstruction technique. CONTRAST:  80mL OMNIPAQUE IOHEXOL 300 MG/ML  SOLN COMPARISON:  09/05/2020. FINDINGS: Lower chest: Heart is normal in size and there is no pericardial effusion. Scattered coronary artery calcifications are noted. Mild atelectasis is present at the lung bases. Hepatobiliary: A cyst is present in the left lobe of the liver. No biliary ductal dilatation. Gallbladder is without stones. Pancreas: Unremarkable. No pancreatic ductal dilatation or surrounding inflammatory changes. Spleen: Normal in size without focal abnormality. Adrenals/Urinary Tract: No adrenal nodule or  mass. The kidneys enhance symmetrically. Subcentimeter hypodensities are present bilaterally which are too small to further characterize. Nonobstructive renal calculi are noted bilaterally. No ureteral calculus or obstructive uropathy. There is diffuse bladder wall thickening. Stomach/Bowel: The stomach is within normal limits. Multiple distended loops of small bowel are noted in the abdomen with air-fluid levels measuring up to 3.7 cm. A transition point is noted in the right lower quadrant with decompression of the distal small bowel, axial image 79. Multiple diverticula are present along the colon. There is bowel wall thickening at the sigmoid colon with mild surrounding inflammatory changes in the pelvis. The appendix appears normal. Vascular/Lymphatic: Aortic atherosclerosis. No enlarged abdominal or pelvic lymph nodes. Reproductive: Prostate gland is mildly enlarged. Other: Small amount of free fluid with foci of air are noted in the pelvis. There is a loculated fluid collection with air in the right lower quadrant measuring 4.0 x 2.3 cm, adjacent to the region of bowel obstruction. A moderate amount of free air is noted in the abdomen and in a fat containing umbilical hernia. Musculoskeletal: Anterior and posterior lumbar spinal fusion hardware is noted from L4-L5. Degenerative changes are noted in the thoracolumbar spine. IMPRESSION: 1. Status post anterior and posterior lumbar spinal fusion hardware from L4-L5. 2. Moderate amount of free air in the abdomen and pelvis, more than anticipated for postoperative time. Findings are concerning for possible bowel perforation. Surgical consultation is recommended. 3. Focal fluid collections in the right lower quadrant and pelvis containing air, concerning for developing abscess. Dilated loops of small bowel in the abdomen and pelvis with a transition point in the right lower quadrant adjacent to a fluid collection and decompression of the small bowel distal to this  point, concerning for small bowel obstruction. 4. Colonic diverticulosis with bowel wall thickening and fat stranding at the sigmoid colon, possible diverticulitis versus local inflammatory changes. 5. Bladder wall thickening, suggesting infectious or inflammatory cystitis. 6. Bilateral nephrolithiasis. 7. Coronary artery calcifications and aortic atherosclerosis. 8. Remaining incidental findings as described above. Critical Value/emergent results were called by telephone at the time of interpretation on 06/19/2023 at 10:55 pm to provider Dr. Audrie Lia, who verbally acknowledged these results. Electronically Signed   By: Thornell Sartorius M.D.   On: 06/19/2023 22:56   DG Shoulder Left  Result Date: 06/19/2023 CLINICAL DATA:  Postoperative fever and constipation. Shoulder pain after recent fall. Stomach pain, vomiting, and nausea. EXAM: LEFT SHOULDER - 2+ VIEW COMPARISON:  None Available. FINDINGS: Degenerative changes in the glenohumeral joint with mild glenoid osteophyte formation. No evidence of acute fracture or dislocation. No focal bone lesion or bone destruction. Soft tissues are unremarkable. IMPRESSION: Mild degenerative changes in the glenohumeral joint. No acute bony abnormalities. Electronically Signed   By: Burman Nieves M.D.   On: 06/19/2023 21:49    SIGNED: Kendell Bane, MD, FHM. FAAFP. [facility] - [facility] Time spent - 35 min.  In seeing, evaluating and examining the patient. Reviewing medical records, labs, drawn  plan of care. [facility],  Pager (please use [email or web address] to page/ text) Please use Epic Secure Chat for non-urgent communication (7AM-7PM)  If 7PM-7AM, please contact night-coverage [email or web address], 06/20/2023, 11:24 AM

## 2023-06-20 NOTE — Progress Notes (Signed)
Chart and xrays reviewed.  Will need supplementation of potassium prior to surgery.  To get 6 runs of potassium.  Surgery intended for later today once hypokalemia corrected.

## 2023-06-20 NOTE — Progress Notes (Signed)
Lab called critical lab potassium 2.7. Amion MD Shahmehdi. New orders placed.

## 2023-06-20 NOTE — Assessment & Plan Note (Addendum)
-   Resume Norvasc, chlorthalidone, losartan when patient is able to tolerate p.o. -N.p.o. at this point, utilizing as needed IV hydralazine

## 2023-06-20 NOTE — Progress Notes (Signed)
Patient has arrived to the floor. NG tube is set to gravity per MD order. Assessment and vital signs completed as written in chart. Wife state that abdomen feels tighter and appear larger than when they arrived in the ER. I have started measurements on the abdomen. The first measurement is marked at 26 inches and we will continue to monitor throughout the shift.

## 2023-06-20 NOTE — Assessment & Plan Note (Addendum)
-   Heart rate as low as 55 recorded in the ED -Improved - Monitor electrolytes, repleting

## 2023-06-20 NOTE — Assessment & Plan Note (Signed)
-   Resume Prilosec when patient is able to tolerate p.o. - In the meantime IV Protonix

## 2023-06-21 DIAGNOSIS — K5720 Diverticulitis of large intestine with perforation and abscess without bleeding: Secondary | ICD-10-CM | POA: Diagnosis not present

## 2023-06-21 LAB — BASIC METABOLIC PANEL
Anion gap: 10 (ref 5–15)
BUN: 27 mg/dL — ABNORMAL HIGH (ref 8–23)
CO2: 30 mmol/L (ref 22–32)
Calcium: 8.6 mg/dL — ABNORMAL LOW (ref 8.9–10.3)
Chloride: 95 mmol/L — ABNORMAL LOW (ref 98–111)
Creatinine, Ser: 1.15 mg/dL (ref 0.61–1.24)
GFR, Estimated: >60 mL/min (ref >60)
Glucose, Bld: 151 mg/dL — ABNORMAL HIGH (ref 70–99)
Potassium: 3.4 mmol/L — ABNORMAL LOW (ref 3.5–5.1)
Sodium: 135 mmol/L (ref 135–145)

## 2023-06-21 LAB — CBC
HCT: 33.3 % — ABNORMAL LOW (ref 39.0–52.0)
Hemoglobin: 10.9 g/dL — ABNORMAL LOW (ref 13.0–17.0)
MCH: 29.6 pg (ref 26.0–34.0)
MCHC: 32.7 g/dL (ref 30.0–36.0)
MCV: 90.5 fL (ref 80.0–100.0)
Platelets: 483 10*3/uL — ABNORMAL HIGH (ref 150–400)
RBC: 3.68 MIL/uL — ABNORMAL LOW (ref 4.22–5.81)
RDW: 13.8 % (ref 11.5–15.5)
WBC: 14.8 10*3/uL — ABNORMAL HIGH (ref 4.0–10.5)
nRBC: 0 % (ref 0.0–0.2)

## 2023-06-21 LAB — PHOSPHORUS: Phosphorus: 3.8 mg/dL (ref 2.5–4.6)

## 2023-06-21 LAB — MAGNESIUM: Magnesium: 2.1 mg/dL (ref 1.7–2.4)

## 2023-06-21 MED ORDER — POTASSIUM CHLORIDE 10 MEQ/100ML IV SOLN
10.0000 meq | INTRAVENOUS | Status: AC
  Administered 2023-06-21 (×3): 10 meq via INTRAVENOUS
  Filled 2023-06-21 (×3): qty 100

## 2023-06-21 MED ORDER — POTASSIUM CHLORIDE 2 MEQ/ML IV SOLN
INTRAVENOUS | Status: DC
  Filled 2023-06-21 (×8): qty 1000

## 2023-06-21 MED ORDER — PANTOPRAZOLE SODIUM 40 MG PO TBEC
40.0000 mg | DELAYED_RELEASE_TABLET | Freq: Every day | ORAL | Status: DC
  Administered 2023-06-21 – 2023-06-23 (×3): 40 mg via ORAL
  Filled 2023-06-21 (×3): qty 1

## 2023-06-21 MED ORDER — OXYCODONE HCL 5 MG PO TABS
5.0000 mg | ORAL_TABLET | ORAL | Status: DC | PRN
  Administered 2023-06-21 – 2023-06-22 (×2): 5 mg via ORAL
  Filled 2023-06-21 (×2): qty 1

## 2023-06-21 NOTE — Progress Notes (Signed)
1 Day Post-Op  Subjective: Patient has no complaints.  Feels much better postoperatively.  Objective: Vital signs in last 24 hours: Temp:  [97.5 F (36.4 C)-98.6 F (37 C)] 98.5 F (36.9 C) (07/21 0436) Pulse Rate:  [68-78] 73 (07/21 0436) Resp:  [11-20] 18 (07/21 0436) BP: (116-121)/(71-82) 116/76 (07/21 0436) SpO2:  [94 %-97 %] 94 % (07/21 0436) Last BM Date : 06/17/23  Intake/Output from previous day: 07/20 0701 - 07/21 0700 In: 2397.8 [I.V.:2005.3; IV Piggyback:392.5] Out: 2600 [Urine:1675; Emesis/NG output:200; Drains:20; Blood:5] Intake/Output this shift: Total I/O In: -  Out: 500 [Urine:500]  General appearance: alert, cooperative, and no distress Resp: clear to auscultation bilaterally Cardio: regular rate and rhythm, S1, S2 normal, no murmur, click, rub or gallop GI: Soft, incision healing well.  Occasional bowel sounds appreciated.  Lab Results:  Recent Labs    06/20/23 0413 06/21/23 0416  WBC 12.1* 14.8*  HGB 10.5* 10.9*  HCT 31.3* 33.3*  PLT 408* 483*   BMET Recent Labs    06/20/23 1545 06/21/23 0416  NA 134* 135  K 3.5 3.4*  CL 94* 95*  CO2 28 30  GLUCOSE 205* 151*  BUN 31* 27*  CREATININE 1.16 1.15  CALCIUM 8.6* 8.6*   PT/INR No results for input(s): "LABPROT", "INR" in the last 72 hours.  Studies/Results: DG Chest Portable 1 View  Result Date: 06/20/2023 CLINICAL DATA:  NG placement. EXAM: PORTABLE CHEST 1 VIEW COMPARISON:  Chest radiograph dated 06/19/2023. FINDINGS: Enteric tube with side-port in the proximal stomach and tip beyond the inferior margin of the image. No interval change in the appearance of the lungs since the earlier radiograph. Trace pneumoperitoneum suspected under the right hemidiaphragm. No acute osseous pathology. IMPRESSION: 1. Enteric tube with side-port in the proximal stomach and tip beyond the inferior margin of the image. 2. Trace pneumoperitoneum suspected under the right hemidiaphragm. Electronically Signed   By:  Elgie Collard M.D.   On: 06/20/2023 02:24   DG ABD ACUTE 2+V W 1V CHEST  Addendum Date: 06/19/2023   ADDENDUM REPORT: 06/19/2023 23:52 ADDENDUM: These results were called by telephone at the time of interpretation on 06/19/2023 at 10:56 p.m. to provider Dr. Audrie Lia, who verbally acknowledged these results. Electronically Signed   By: Darliss Cheney M.D.   On: 06/19/2023 23:52   Result Date: 06/19/2023 CLINICAL DATA:  Postoperative fever EXAM: DG ABDOMEN ACUTE WITH 1 VIEW CHEST COMPARISON:  CT abdomen and pelvis 09/05/2020 FINDINGS: The lungs are clear.  There is no pleural effusion or pneumothorax. There is a small amount of free air under the right hemidiaphragm. There are dilated small bowel loops in the central abdomen measuring up to 6 cm. The colon appears nondilated. There are no suspicious calcifications identified. Lower lumbar fusion hardware is unremarkable. No acute fractures are seen. IMPRESSION: 1. Small amount of free air under the right hemidiaphragm. 2. Dilated small bowel loops in the central abdomen measuring up to 6 cm. Findings are concerning for small bowel obstruction. 3. No acute cardiopulmonary process. Electronically Signed: By: Darliss Cheney M.D. On: 06/19/2023 21:49   CT ABDOMEN PELVIS W CONTRAST  Result Date: 06/19/2023 CLINICAL DATA:  Constipation, emesis. Bowel obstruction suspected. Back surgery last Thursday, July 11th. EXAM: CT ABDOMEN AND PELVIS WITH CONTRAST TECHNIQUE: Multidetector CT imaging of the abdomen and pelvis was performed using the standard protocol following bolus administration of intravenous contrast. RADIATION DOSE REDUCTION: This exam was performed according to the departmental dose-optimization program which includes automated exposure control,  adjustment of the mA and/or kV according to patient size and/or use of iterative reconstruction technique. CONTRAST:  80mL OMNIPAQUE IOHEXOL 300 MG/ML  SOLN COMPARISON:  09/05/2020. FINDINGS: Lower chest: Heart is  normal in size and there is no pericardial effusion. Scattered coronary artery calcifications are noted. Mild atelectasis is present at the lung bases. Hepatobiliary: A cyst is present in the left lobe of the liver. No biliary ductal dilatation. Gallbladder is without stones. Pancreas: Unremarkable. No pancreatic ductal dilatation or surrounding inflammatory changes. Spleen: Normal in size without focal abnormality. Adrenals/Urinary Tract: No adrenal nodule or mass. The kidneys enhance symmetrically. Subcentimeter hypodensities are present bilaterally which are too small to further characterize. Nonobstructive renal calculi are noted bilaterally. No ureteral calculus or obstructive uropathy. There is diffuse bladder wall thickening. Stomach/Bowel: The stomach is within normal limits. Multiple distended loops of small bowel are noted in the abdomen with air-fluid levels measuring up to 3.7 cm. A transition point is noted in the right lower quadrant with decompression of the distal small bowel, axial image 79. Multiple diverticula are present along the colon. There is bowel wall thickening at the sigmoid colon with mild surrounding inflammatory changes in the pelvis. The appendix appears normal. Vascular/Lymphatic: Aortic atherosclerosis. No enlarged abdominal or pelvic lymph nodes. Reproductive: Prostate gland is mildly enlarged. Other: Small amount of free fluid with foci of air are noted in the pelvis. There is a loculated fluid collection with air in the right lower quadrant measuring 4.0 x 2.3 cm, adjacent to the region of bowel obstruction. A moderate amount of free air is noted in the abdomen and in a fat containing umbilical hernia. Musculoskeletal: Anterior and posterior lumbar spinal fusion hardware is noted from L4-L5. Degenerative changes are noted in the thoracolumbar spine. IMPRESSION: 1. Status post anterior and posterior lumbar spinal fusion hardware from L4-L5. 2. Moderate amount of free air in the  abdomen and pelvis, more than anticipated for postoperative time. Findings are concerning for possible bowel perforation. Surgical consultation is recommended. 3. Focal fluid collections in the right lower quadrant and pelvis containing air, concerning for developing abscess. Dilated loops of small bowel in the abdomen and pelvis with a transition point in the right lower quadrant adjacent to a fluid collection and decompression of the small bowel distal to this point, concerning for small bowel obstruction. 4. Colonic diverticulosis with bowel wall thickening and fat stranding at the sigmoid colon, possible diverticulitis versus local inflammatory changes. 5. Bladder wall thickening, suggesting infectious or inflammatory cystitis. 6. Bilateral nephrolithiasis. 7. Coronary artery calcifications and aortic atherosclerosis. 8. Remaining incidental findings as described above. Critical Value/emergent results were called by telephone at the time of interpretation on 06/19/2023 at 10:55 pm to provider Dr. Audrie Lia, who verbally acknowledged these results. Electronically Signed   By: Thornell Sartorius M.D.   On: 06/19/2023 22:56   DG Shoulder Left  Result Date: 06/19/2023 CLINICAL DATA:  Postoperative fever and constipation. Shoulder pain after recent fall. Stomach pain, vomiting, and nausea. EXAM: LEFT SHOULDER - 2+ VIEW COMPARISON:  None Available. FINDINGS: Degenerative changes in the glenohumeral joint with mild glenoid osteophyte formation. No evidence of acute fracture or dislocation. No focal bone lesion or bone destruction. Soft tissues are unremarkable. IMPRESSION: Mild degenerative changes in the glenohumeral joint. No acute bony abnormalities. Electronically Signed   By: Burman Nieves M.D.   On: 06/19/2023 21:49    Anti-infectives: Anti-infectives (From admission, onward)    Start     Dose/Rate Route Frequency Ordered Stop  06/20/23 1100  metroNIDAZOLE (FLAGYL) IVPB 500 mg        500 mg 100 mL/hr over  60 Minutes Intravenous Every 12 hours 06/20/23 0450     06/20/23 0800  cefTRIAXone (ROCEPHIN) 2 g in sodium chloride 0.9 % 100 mL IVPB        2 g 200 mL/hr over 30 Minutes Intravenous Every 24 hours 06/20/23 0450     06/19/23 2300  metroNIDAZOLE (FLAGYL) IVPB 500 mg        500 mg 100 mL/hr over 60 Minutes Intravenous  Once 06/19/23 2257 06/20/23 0010   06/19/23 2300  ceFEPIme (MAXIPIME) 2 g in sodium chloride 0.9 % 100 mL IVPB        2 g 200 mL/hr over 30 Minutes Intravenous  Once 06/19/23 2259 06/19/23 2341       Assessment/Plan: s/p Procedure(s): EXPLORATORY LAPAROTOMY DRAIN ABDOMINAL ABSCESS Impression: Stable on postoperative day 1.  Foley has been removed.  Will remove NG tube and start clear liquid diet as no bowel resection was performed.  JP drainage cloudy serous in nature.  Mixed bacteria shown on Gram stain.  Continue cefepime and Flagyl.  Awaiting full return of bowel function.  Hypokalemia resolving.  LOS: 1 day    Frank [name] 06/21/2023

## 2023-06-21 NOTE — Plan of Care (Signed)

## 2023-06-21 NOTE — Progress Notes (Signed)
PROGRESS NOTE    Patient: Frank [name]                            PCP: Carylon Perches, MD                    DOB: Mar 05, 1951            DOA: 06/19/2023 WUJ:[id number]             DOS: 06/21/2023, 11:38 AM   LOS: 1 day   Date of Service: The patient was seen and examined on 06/21/2023  Subjective:   Postop day #1 S/p exploratory laparotomy with drainage of intra-abdominal abscess  Tolerated surgery well, nasogastric tube still in place, with minimal drainage Comfortable sitting up in chair Hemodynamically stable N.p.o. Reporting of no gas or bowel movement yet   Brief Narrative:    MARKEVION LATTIN is a 71 y.o. male with medical history significant of arthritis, GERD, CKD, hyperlipidemia, hypertension, and more presents the ED with a chief complaint of nausea and vomiting.  Patient reports that he has had nausea and vomiting since the night of the 18th.  He has vomited roughly 3 times per day.  It has been nonbloody emesis.  He denies abdominal pain but reports he has had discomfort in his abdomen.  He reports his abdomen feels bloated.  His last p.o. intake was on the 19th at 4 PM and was chicken and peaches.  He threw that up about 30 minutes after eating it.  His last bowel movement was on Tuesday or Wednesday.  He reports that is not normal for him and he has felt constipated.  He also had a fever of 102 on Tuesday or Wednesday.  Wife reports that he does not remember some of this because he felt so poorly.  She thinks his memory is worse than his baseline due to how sick he has been.  They were giving him Tylenol for his fever and it helped but his fever would come right back.  It lasted approximately 24-36 hours.  Patient reports right now he feels bloated.  He describes the discomfort as feeling too full.  Wife reports he has had constant eructation and has been requesting antacids and Rolaids.  Patient has never had a history of perforation.  He has recently had an L4-L5 fusion surgery.   It was a posterior approach.  He has no surgical pain.  Wife reports he did take Percocet on day 1 and day 2 postop.  He has not taken much Percocet since then.  He is now on postop day 9.  He has no other complaints at this time.   Patient does not smoke, does not drink.  He has been vaccinated for COVID and flu.  Patient reports that he would like to be full code.  Wife volunteers that they have both decided they would want to be DNR if terminal, but otherwise full code.  Clarification is going to be needed.   ED Temp 98, heart rate 55-82, RR 11-20, BP 102/72-120/91, satting 95% Leukocytosis at 12.9, hemoglobin 11.8, platelets 440 Slight hyponatremia 131, hypokalemia 2.4, hypochloremia 84 BUN 42 and creatinine 1.62 UA does not indicative UTI CT abdomen pelvis shows free air in the abdomen and pelvis, with focal fluid collections in the right lower quadrant better concerning for abscess, and stranding around the colon is concerning for diverticulitis with possible perforation. General surgery consulted  and plans to take patient to the OR 7/20 Neurosurgery was also consulted at the request of general surgery and they reported no need to see him Patient was given 1 L bolus, Pepcid, Flagyl, Zofran, cefepime, potassium, and NG tube was placed in the ED Admission requested for SBO, diverticulitis with possible curve, developing abscesses in the abdomen    Assessment & Plan:   Principal Problem:   Diverticulitis of large intestine with abscess Active Problems:   GERD (gastroesophageal reflux disease)   HTN (hypertension)   Sinus bradycardia   Hypokalemia   AKI (acute kidney injury) (HCC)   Rash   SBO (small bowel obstruction) (HCC)   Perforated abdominal viscus   Diverticulitis large intestine w/o perforation or abscess w/o bleeding     Assessment and Plan: * Diverticulitis of large intestine with abscess - Postop day #1 -Status post exploratory laparotomy with draining  of-intra-abdominal abscess -Tolerated surgery well -Medically stable, n.p.o. NG tube still in place  -Continuing IV fluids, as needed IV analgesics -Surgery following -Reporting of no gas or bowel movement yet  ------------------------------------------------------------------------------------------------------------- - CT abdomen pelvis:  shows status post anterior and posterior lumbar spinal fusion hardware from L4-L5 Free air in the abdomen and pelvis more than anticipated for postoperative time concerning for bowel perforation  Focal fluid collections in the right lower quadrant and pelvis containing air concerning for developing abscess Small bowel obstruction with transition zone in the right lower quadrant - Stranding of the sigmoid colon possible diverticulitis   - Continue on Rocephin and Flagyl  - Pain control with morphine - Continue IV fluids - Associated leukocytosis of 12.9  - NG tube in place to gravity as ordered prior to admission - Continue to monitor  SBO (small bowel obstruction) (HCC) - As seen on CT - Transition zone in the right lower quadrant -Status post exploratory laparotomy, -Postop day #1 -Pending return of bowel function  Rash -Resolved - POA: Blanchable erythematous rash on arms and legs - Try one-time dose of Benadryl and Solu-Medrol - Patient denies any pruritus or pain - Only medication is Percocet after lumbar fusion surgery - Continue to monitor  AKI (acute kidney injury) (HCC) - Creatinine up to 1.62 from 1.25 Lab Results  Component Value Date   CREATININE 1.37 (H) 06/20/2023   CREATININE 1.62 (H) 06/19/2023   CREATININE 1.25 (H) 05/28/2023   -Continue IV fluid resuscitation - Trend creatinine  - Avoid nephrotoxic agents when possible  Hypokalemia - Potassium down to 2.4 >>2.7 >>3.4  -Repleting with IV fluids -Monitoring and repleting accordingly  Sinus bradycardia - Heart rate as low as 55 recorded in the  ED -Improved - Monitor electrolytes, repleting   HTN (hypertension) - Resume Norvasc, chlorthalidone, losartan when patient is able to tolerate p.o. -N.p.o. at this point, utilizing as needed IV hydralazine  GERD (gastroesophageal reflux disease) - Resume Prilosec when patient is able to tolerate p.o. - In the meantime IV Protonix   -------------------------------------------------------------------------------------------------------------------------------- Nutritional status:  The patient's BMI is: Body mass index is 26.54 kg/m. I agree with the assessment and plan as outlined --------------------------------------------------------------------------------------------------------------------------------  DVT prophylaxis:  enoxaparin (LOVENOX) injection 40 mg Start: 06/21/23 0800 SCD's Start: 06/20/23 1445 SCDs Start: 06/20/23 0451   Code Status:   Code Status: Full Code  Family Communication:-Other family members present at bedside, updated The above findings and plan of care has been discussed with patient (and family)  in detail,  they expressed understanding and agreement of above. -Advance care planning has been  discussed.   Admission status:   Status is: Inpatient Remains inpatient appropriate because: Need to be n.p.o., IV fluids, needing surgical evaluation for possible surgical invention for small bowel obstruction   Disposition: From  - home             Planning for discharge in 1-2 days: to   Procedures:   No admission procedures for hospital encounter.   Antimicrobials:  Anti-infectives (From admission, onward)    Start     Dose/Rate Route Frequency Ordered Stop   06/20/23 1100  metroNIDAZOLE (FLAGYL) IVPB 500 mg        500 mg 100 mL/hr over 60 Minutes Intravenous Every 12 hours 06/20/23 0450     06/20/23 0800  cefTRIAXone (ROCEPHIN) 2 g in sodium chloride 0.9 % 100 mL IVPB        2 g 200 mL/hr over 30 Minutes Intravenous Every 24 hours 06/20/23  0450     06/19/23 2300  metroNIDAZOLE (FLAGYL) IVPB 500 mg        500 mg 100 mL/hr over 60 Minutes Intravenous  Once 06/19/23 2257 06/20/23 0010   06/19/23 2300  ceFEPIme (MAXIPIME) 2 g in sodium chloride 0.9 % 100 mL IVPB        2 g 200 mL/hr over 30 Minutes Intravenous  Once 06/19/23 2259 06/19/23 2341        Medication:   enoxaparin (LOVENOX) injection  40 mg Subcutaneous Q24H   pantoprazole (PROTONIX) IV  40 mg Intravenous Q24H    acetaminophen **OR** acetaminophen, HYDROmorphone (DILAUDID) injection, ondansetron **OR** ondansetron (ZOFRAN) IV, phenol   Objective:   Vitals:   06/20/23 1448 06/20/23 1815 06/20/23 2208 06/21/23 0436  BP: 119/76 121/72 119/81 116/76  Pulse: 78 71 68 73  Resp: 14  16 18   Temp: (!) 97.5 F (36.4 C) 97.9 F (36.6 C) 98.6 F (37 C) 98.5 F (36.9 C)  TempSrc: Oral Oral    SpO2: 95% 95% 95% 94%  Weight:      Height:        Intake/Output Summary (Last 24 hours) at 06/21/2023 1138 Last data filed at 06/21/2023 1000 Gross per 24 hour  Intake 2397.75 ml  Output 3100 ml  Net -702.25 ml   Filed Weights   06/19/23 2029 06/20/23 0256  Weight: 87 kg 86.3 kg     Physical examination:   General:  AAO x 3,  cooperative, no distress;   HEENT:  Normocephalic, PERRL, otherwise with in Normal limits   Neuro:  CNII-XII intact. , normal motor and sensation, reflexes intact   Lungs:   Clear to auscultation BL, Respirations unlabored,  No wheezes / crackles  Cardio:    S1/S2, RRR, No murmure, No Rubs or Gallops   Abdomen:  Soft, non-tender, hypoactive bowel sounds, surgical wound clean dressing in place  no guarding or peritoneal signs.  Muscular  skeletal:  Limited exam -global generalized weaknesses - in bed, able to move all 4 extremities,   2+ pulses,  symmetric, No pitting edema  Skin:  Dry, warm to touch, abdominal surgical wound clean, dressing in place  Wounds: Please see nursing documentation           ------------------------------------------------------------------------------------------------------------------------------------------    LABs:     Latest Ref Rng & Units 06/21/2023    4:16 AM 06/20/2023    4:13 AM 06/19/2023    8:42 PM  CBC  WBC 4.0 - 10.5 K/uL 14.8  12.1  12.9   Hemoglobin 13.0 - 17.0 g/dL  10.9  10.5  11.8   Hematocrit 39.0 - 52.0 % 33.3  31.3  34.1   Platelets 150 - 400 K/uL 483  408  440       Latest Ref Rng & Units 06/21/2023    4:16 AM 06/20/2023    3:45 PM 06/20/2023    4:13 AM  CMP  Glucose 70 - 99 mg/dL 829  562  130   BUN 8 - 23 mg/dL 27  31  38   Creatinine 0.61 - 1.24 mg/dL 8.65  7.84  6.96   Sodium 135 - 145 mmol/L 135  134  131   Potassium 3.5 - 5.1 mmol/L 3.4  3.5  2.7   Chloride 98 - 111 mmol/L 95  94  88   CO2 22 - 32 mmol/L 30  28  33   Calcium 8.9 - 10.3 mg/dL 8.6  8.6  8.9   Total Protein 6.5 - 8.1 g/dL   6.5   Total Bilirubin 0.3 - 1.2 mg/dL   1.0   Alkaline Phos 38 - 126 U/L   52   AST 15 - 41 U/L   26   ALT 0 - 44 U/L   25        Micro Results Recent Results (from the past 240 hour(s))  Aerobic/Anaerobic Culture w Gram Stain (surgical/deep wound)     Status: None (Preliminary result)   Collection Time: 06/20/23  1:06 PM   Specimen: Path fluid; Body Fluid  Result Value Ref Range Status   Specimen Description   Final    FLUID Performed at [facility], [address], [place], [place] [address]    Special Requests DEEP ABDOMINAL CAVITY ID A  Final   Gram Stain   Final    FEW WBC PRESENT, PREDOMINANTLY MONONUCLEAR FEW GRAM POSITIVE COCCI IN PAIRS AND CHAINS FEW GRAM NEGATIVE RODS Performed at [facility], [address], [place], [place] [address]    Culture PENDING  Incomplete   Report Status PENDING  Incomplete    Radiology Reports No results found.  SIGNED: Kendell Bane, MD, FHM. FAAFP. [facility] - [facility] Time spent - 35 min.  In seeing, evaluating and examining the patient. Reviewing  medical records, labs, drawn plan of care. [facility],  Pager (please use [email or web address] to page/ text) Please use Epic Secure Chat for non-urgent communication (7AM-7PM)  If 7PM-7AM, please contact night-coverage [email or web address], 06/21/2023, 11:38 AM

## 2023-06-21 NOTE — Plan of Care (Signed)
  Problem: Education: Goal: Ability to verbalize activity precautions or restrictions will improve Outcome: Progressing Goal: Knowledge of the prescribed therapeutic regimen will improve Outcome: Progressing Goal: Understanding of discharge needs will improve Outcome: Progressing   Problem: Bowel/Gastric: Goal: Gastrointestinal status for postoperative course will improve Outcome: Progressing   Problem: Clinical Measurements: Goal: Ability to maintain clinical measurements within normal limits will improve Outcome: Progressing

## 2023-06-21 NOTE — Progress Notes (Signed)
   06/21/23 1302  TOC Brief Assessment  Insurance and Status Reviewed  Patient has primary care physician Yes  Home environment has been reviewed Home with spouse  Prior level of function: independent  Prior/Current Home Services No current home services  Social Determinants of Health Reivew SDOH reviewed no interventions necessary  Readmission risk has been reviewed Yes  Transition of care needs no transition of care needs at this time   Surgery complete, plans to discharge home Tuesday.

## 2023-06-22 LAB — COMPREHENSIVE METABOLIC PANEL
ALT: 22 U/L (ref 0–44)
AST: 21 U/L (ref 15–41)
Albumin: 2.4 g/dL — ABNORMAL LOW (ref 3.5–5.0)
Alkaline Phosphatase: 47 U/L (ref 38–126)
Anion gap: 10 (ref 5–15)
BUN: 23 mg/dL (ref 8–23)
CO2: 29 mmol/L (ref 22–32)
Calcium: 8.5 mg/dL — ABNORMAL LOW (ref 8.9–10.3)
Chloride: 95 mmol/L — ABNORMAL LOW (ref 98–111)
Creatinine, Ser: 1.03 mg/dL (ref 0.61–1.24)
GFR, Estimated: >60 mL/min (ref >60)
Glucose, Bld: 112 mg/dL — ABNORMAL HIGH (ref 70–99)
Potassium: 3.2 mmol/L — ABNORMAL LOW (ref 3.5–5.1)
Sodium: 134 mmol/L — ABNORMAL LOW (ref 135–145)
Total Bilirubin: 0.8 mg/dL (ref 0.3–1.2)
Total Protein: 5.8 g/dL — ABNORMAL LOW (ref 6.5–8.1)

## 2023-06-22 LAB — CBC
HCT: 31.5 % — ABNORMAL LOW (ref 39.0–52.0)
Hemoglobin: 10.3 g/dL — ABNORMAL LOW (ref 13.0–17.0)
MCH: 29.3 pg (ref 26.0–34.0)
MCHC: 32.7 g/dL (ref 30.0–36.0)
MCV: 89.7 fL (ref 80.0–100.0)
Platelets: 489 10*3/uL — ABNORMAL HIGH (ref 150–400)
RBC: 3.51 MIL/uL — ABNORMAL LOW (ref 4.22–5.81)
RDW: 13.7 % (ref 11.5–15.5)
WBC: 12 10*3/uL — ABNORMAL HIGH (ref 4.0–10.5)
nRBC: 0 % (ref 0.0–0.2)

## 2023-06-22 LAB — AEROBIC/ANAEROBIC CULTURE W GRAM STAIN (SURGICAL/DEEP WOUND)

## 2023-06-22 MED ORDER — SENNOSIDES-DOCUSATE SODIUM 8.6-50 MG PO TABS
2.0000 | ORAL_TABLET | Freq: Every day | ORAL | Status: DC
  Administered 2023-06-22 – 2023-06-23 (×2): 2 via ORAL
  Filled 2023-06-22 (×2): qty 2

## 2023-06-22 MED ORDER — POTASSIUM CHLORIDE 10 MEQ/100ML IV SOLN
10.0000 meq | INTRAVENOUS | Status: AC
  Administered 2023-06-22 (×4): 10 meq via INTRAVENOUS
  Filled 2023-06-22 (×4): qty 100

## 2023-06-22 MED ORDER — LINEZOLID 600 MG PO TABS
600.0000 mg | ORAL_TABLET | Freq: Two times a day (BID) | ORAL | Status: DC
  Administered 2023-06-22 – 2023-06-23 (×3): 600 mg via ORAL
  Filled 2023-06-22 (×5): qty 1

## 2023-06-22 NOTE — Progress Notes (Signed)
2 Days Post-Op  Subjective: Patient passing flatus.  No significant incisional pain.  No nausea or vomiting noted.  Objective: Vital signs in last 24 hours: Temp:  [98.3 F (36.8 C)-100 F (37.8 C)] 98.7 F (37.1 C) (07/22 0614) Pulse Rate:  [69-76] 69 (07/22 0614) Resp:  [18] 18 (07/21 1309) BP: (126-134)/(75-82) 134/75 (07/22 0614) SpO2:  [93 %-94 %] 93 % (07/22 0614) Last BM Date : 06/17/23  Intake/Output from previous day: 07/21 0701 - 07/22 0700 In: 2626.6 [P.O.:740; I.V.:1486.6; IV Piggyback:400] Out: 1155 [Urine:800; Emesis/NG output:300; Drains:55] Intake/Output this shift: Total I/O In: 240 [P.O.:240] Out: -   General appearance: alert, cooperative, and no distress Resp: clear to auscultation bilaterally Cardio: regular rate and rhythm, S1, S2 normal, no murmur, click, rub or gallop GI: Soft, incision healing well.  Occasional bowel sounds appreciated.  Lab Results:  Recent Labs    06/21/23 0416 06/22/23 0419  WBC 14.8* 12.0*  HGB 10.9* 10.3*  HCT 33.3* 31.5*  PLT 483* 489*   BMET Recent Labs    06/21/23 0416 06/22/23 0419  NA 135 134*  K 3.4* 3.2*  CL 95* 95*  CO2 30 29  GLUCOSE 151* 112*  BUN 27* 23  CREATININE 1.15 1.03  CALCIUM 8.6* 8.5*   PT/INR No results for input(s): "LABPROT", "INR" in the last 72 hours.  Studies/Results: No results found.  Anti-infectives: Anti-infectives (From admission, onward)    Start     Dose/Rate Route Frequency Ordered Stop   06/20/23 1100  metroNIDAZOLE (FLAGYL) IVPB 500 mg        500 mg 100 mL/hr over 60 Minutes Intravenous Every 12 hours 06/20/23 0450     06/20/23 0800  cefTRIAXone (ROCEPHIN) 2 g in sodium chloride 0.9 % 100 mL IVPB        2 g 200 mL/hr over 30 Minutes Intravenous Every 24 hours 06/20/23 0450     06/19/23 2300  metroNIDAZOLE (FLAGYL) IVPB 500 mg        500 mg 100 mL/hr over 60 Minutes Intravenous  Once 06/19/23 2257 06/20/23 0010   06/19/23 2300  ceFEPIme (MAXIPIME) 2 g in sodium  chloride 0.9 % 100 mL IVPB        2 g 200 mL/hr over 30 Minutes Intravenous  Once 06/19/23 2259 06/19/23 2341       Assessment/Plan: s/p Procedure(s): EXPLORATORY LAPAROTOMY DRAIN ABDOMINAL ABSCESS Impression: Stable on postoperative day 2.  Bowel function starting to return.  Patient is ambulating.  Will supplement hypokalemia.  Awaiting full return of bowel function.  Awaiting final culture ID.  LOS: 2 days    Frank [name] 06/22/2023

## 2023-06-22 NOTE — Progress Notes (Signed)
PoD #3  Postop patient remained stable The case was discussed with Dr. Lovell Sheehan.  Care of patient will be transferred to Dr. Lovell Sheehan.  Thank You,  TR[facility] will sign off  SIGNED: Kendell Bane, MD, FHM. FAAFP [facility],  Pager (please use [email or web address] to page/text)  Please use Epic Secure Chat for non-urgent communication (7AM-7PM) If 7PM-7AM, please contact night-coverage [email or web address],  06/22/2023, 3:50 PM

## 2023-06-22 NOTE — Progress Notes (Signed)
Patient slept on and off this shift. Dilaudid given once and oxycodone given once, for abdominal pain. Patient ambulated to restroom with assistance and walker to urinate. JP drain had total of 25mL put out. Continued to monitor patient.

## 2023-06-22 NOTE — Progress Notes (Signed)
Mobility Specialist Progress Note:   06/22/23 1019  Mobility  Activity Ambulated with assistance in room;Ambulated with assistance to bathroom  Level of Assistance Minimal assist, patient does 75% or more  Assistive Device Front wheel walker  Distance Ambulated (ft) 20 ft  Range of Motion/Exercises Active;All extremities  Activity Response Tolerated well  Mobility Referral Yes  $Mobility charge 1 Mobility  Mobility Specialist Start Time (ACUTE ONLY) 0930  Mobility Specialist Stop Time (ACUTE ONLY) 0945  Mobility Specialist Time Calculation (min) (ACUTE ONLY) 15 min   Pt received ambulating to bathroom with RW. Tolerated well, asx throughout. Returned pt to bed, MinA required when assisting pt to supine position. Left pt with all needs met, call bell in reach, MD in room.   Feliciana Rossetti Mobility Specialist Please contact via Special educational needs teacher or  Rehab office at [phone]

## 2023-06-22 NOTE — Plan of Care (Signed)
  Problem: Education: Goal: Ability to verbalize activity precautions or restrictions will improve Outcome: Progressing Goal: Knowledge of the prescribed therapeutic regimen will improve Outcome: Progressing Goal: Understanding of discharge needs will improve Outcome: Progressing   Problem: Activity: Goal: Ability to avoid complications of mobility impairment will improve Outcome: Progressing Goal: Ability to tolerate increased activity will improve Outcome: Progressing Goal: Will remain free from falls Outcome: Progressing   Problem: Bowel/Gastric: Goal: Gastrointestinal status for postoperative course will improve Outcome: Progressing

## 2023-06-23 ENCOUNTER — Encounter (HOSPITAL_COMMUNITY): Payer: Self-pay | Admitting: General Surgery

## 2023-06-23 LAB — CBC
HCT: 32.9 % — ABNORMAL LOW (ref 39.0–52.0)
Hemoglobin: 10.7 g/dL — ABNORMAL LOW (ref 13.0–17.0)
MCH: 29.4 pg (ref 26.0–34.0)
MCHC: 32.5 g/dL (ref 30.0–36.0)
MCV: 90.4 fL (ref 80.0–100.0)
Platelets: 549 10*3/uL — ABNORMAL HIGH (ref 150–400)
RBC: 3.64 MIL/uL — ABNORMAL LOW (ref 4.22–5.81)
RDW: 13.4 % (ref 11.5–15.5)
WBC: 9.8 10*3/uL (ref 4.0–10.5)
nRBC: 0 % (ref 0.0–0.2)

## 2023-06-23 LAB — AEROBIC/ANAEROBIC CULTURE W GRAM STAIN (SURGICAL/DEEP WOUND)

## 2023-06-23 LAB — COMPREHENSIVE METABOLIC PANEL
ALT: 21 U/L (ref 0–44)
AST: 21 U/L (ref 15–41)
Albumin: 2.5 g/dL — ABNORMAL LOW (ref 3.5–5.0)
Alkaline Phosphatase: 52 U/L (ref 38–126)
Anion gap: 16 — ABNORMAL HIGH (ref 5–15)
BUN: 18 mg/dL (ref 8–23)
CO2: 23 mmol/L (ref 22–32)
Calcium: 8.9 mg/dL (ref 8.9–10.3)
Chloride: 97 mmol/L — ABNORMAL LOW (ref 98–111)
Creatinine, Ser: 1.04 mg/dL (ref 0.61–1.24)
GFR, Estimated: >60 mL/min (ref >60)
Glucose, Bld: 124 mg/dL — ABNORMAL HIGH (ref 70–99)
Potassium: 3.2 mmol/L — ABNORMAL LOW (ref 3.5–5.1)
Sodium: 136 mmol/L (ref 135–145)
Total Bilirubin: 0.5 mg/dL (ref 0.3–1.2)
Total Protein: 6 g/dL — ABNORMAL LOW (ref 6.5–8.1)

## 2023-06-23 MED ORDER — POTASSIUM CHLORIDE 20 MEQ PO PACK
20.0000 meq | PACK | Freq: Three times a day (TID) | ORAL | Status: DC
  Administered 2023-06-23 – 2023-06-24 (×4): 20 meq via ORAL
  Filled 2023-06-23 (×4): qty 1

## 2023-06-23 MED ORDER — AMOXICILLIN 250 MG PO CAPS
1000.0000 mg | ORAL_CAPSULE | Freq: Three times a day (TID) | ORAL | Status: DC
  Administered 2023-06-23 – 2023-06-24 (×2): 1000 mg via ORAL
  Filled 2023-06-23 (×2): qty 4

## 2023-06-23 MED ORDER — SULFAMETHOXAZOLE-TRIMETHOPRIM 800-160 MG PO TABS
1.0000 | ORAL_TABLET | Freq: Two times a day (BID) | ORAL | Status: DC
  Administered 2023-06-23 – 2023-06-24 (×2): 1 via ORAL
  Filled 2023-06-23 (×2): qty 1

## 2023-06-23 MED ORDER — METRONIDAZOLE 500 MG PO TABS
500.0000 mg | ORAL_TABLET | Freq: Two times a day (BID) | ORAL | Status: DC
  Administered 2023-06-23 – 2023-06-24 (×3): 500 mg via ORAL
  Filled 2023-06-23 (×3): qty 1

## 2023-06-23 MED FILL — Heparin Sodium (Porcine) Inj 1000 Unit/ML: INTRAMUSCULAR | Qty: 30 | Status: AC

## 2023-06-23 MED FILL — Sodium Chloride IV Soln 0.9%: INTRAVENOUS | Qty: 1000 | Status: AC

## 2023-06-23 NOTE — Progress Notes (Signed)
Patient slept on and off this shift, no complaints of pain. 15mL of drainage total from JP drain this shift. Continued to monitor patient.

## 2023-06-23 NOTE — Progress Notes (Signed)
3 Days Post-Op  Subjective: Patient doing well.  Has no complaints.  Is passing flatus.  No bowel movement yet.  No nausea or vomiting noted.  Objective: Vital signs in last 24 hours: Temp:  [98 F (36.7 C)-99.4 F (37.4 C)] 99.4 F (37.4 C) (07/23 0421) Pulse Rate:  [71-89] 71 (07/23 0421) Resp:  [16-18] 18 (07/22 2239) BP: (125-134)/(79-92) 129/91 (07/23 0421) SpO2:  [94 %-95 %] 94 % (07/23 0421) Last BM Date : 06/17/23  Intake/Output from previous day: 07/22 0701 - 07/23 0700 In: 926.9 [P.O.:720; IV Piggyback:206.9] Out: 15 [Drains:15] Intake/Output this shift: Total I/O In: -  Out: 400 [Urine:400]  General appearance: alert, cooperative, and no distress Resp: clear to auscultation bilaterally Cardio: regular rate and rhythm, S1, S2 normal, no murmur, click, rub or gallop GI: Soft, incision healing well.  Bowel sounds active.  JP drainage serous in nature.  Lab Results:  Recent Labs    06/22/23 0419 06/23/23 0340  WBC 12.0* 9.8  HGB 10.3* 10.7*  HCT 31.5* 32.9*  PLT 489* 549*   BMET Recent Labs    06/22/23 0419 06/23/23 0340  NA 134* 136  K 3.2* 3.2*  CL 95* 97*  CO2 29 23  GLUCOSE 112* 124*  BUN 23 18  CREATININE 1.03 1.04  CALCIUM 8.5* 8.9   PT/INR No results for input(s): "LABPROT", "INR" in the last 72 hours.  Studies/Results: No results found.  Anti-infectives: Anti-infectives (From admission, onward)    Start     Dose/Rate Route Frequency Ordered Stop   06/23/23 0930  metroNIDAZOLE (FLAGYL) tablet 500 mg        500 mg Oral Every 8 hours 06/23/23 0844     06/22/23 1515  linezolid (ZYVOX) tablet 600 mg        600 mg Oral Every 12 hours 06/22/23 1423     06/20/23 1100  metroNIDAZOLE (FLAGYL) IVPB 500 mg  Status:  Discontinued        500 mg 100 mL/hr over 60 Minutes Intravenous Every 12 hours 06/20/23 0450 06/23/23 0844   06/20/23 0800  cefTRIAXone (ROCEPHIN) 2 g in sodium chloride 0.9 % 100 mL IVPB        2 g 200 mL/hr over 30 Minutes  Intravenous Every 24 hours 06/20/23 0450     06/19/23 2300  metroNIDAZOLE (FLAGYL) IVPB 500 mg        500 mg 100 mL/hr over 60 Minutes Intravenous  Once 06/19/23 2257 06/20/23 0010   06/19/23 2300  ceFEPIme (MAXIPIME) 2 g in sodium chloride 0.9 % 100 mL IVPB        2 g 200 mL/hr over 30 Minutes Intravenous  Once 06/19/23 2259 06/19/23 2341       Assessment/Plan: s/p Procedure(s): EXPLORATORY LAPAROTOMY DRAIN ABDOMINAL ABSCESS Impression: Stable on postoperative day 3.  Awaiting sensitivities from intra-abdominal culture.  Awaiting full return of bowel function. Plan: Advance to soft diet.  Anticipate discharge in next 24 to 48 hours.  LOS: 3 days    [name] 06/23/2023

## 2023-06-23 NOTE — Plan of Care (Signed)
Pt tolerated advanced diet from full liquids to soft. Pt passing gas, no bowel movement at this time. Pt expresses he is ready to go home.

## 2023-06-23 NOTE — Plan of Care (Signed)

## 2023-06-24 LAB — CBC
HCT: 33.9 % — ABNORMAL LOW (ref 39.0–52.0)
Hemoglobin: 11.2 g/dL — ABNORMAL LOW (ref 13.0–17.0)
MCH: 29.7 pg (ref 26.0–34.0)
MCHC: 33 g/dL (ref 30.0–36.0)
MCV: 89.9 fL (ref 80.0–100.0)
Platelets: 595 10*3/uL — ABNORMAL HIGH (ref 150–400)
RBC: 3.77 MIL/uL — ABNORMAL LOW (ref 4.22–5.81)
RDW: 13.4 % (ref 11.5–15.5)
WBC: 9.3 10*3/uL (ref 4.0–10.5)
nRBC: 0 % (ref 0.0–0.2)

## 2023-06-24 LAB — COMPREHENSIVE METABOLIC PANEL
ALT: 27 U/L (ref 0–44)
AST: 25 U/L (ref 15–41)
Albumin: 2.5 g/dL — ABNORMAL LOW (ref 3.5–5.0)
Alkaline Phosphatase: 56 U/L (ref 38–126)
Anion gap: 12 (ref 5–15)
BUN: 19 mg/dL (ref 8–23)
CO2: 24 mmol/L (ref 22–32)
Calcium: 8.7 mg/dL — ABNORMAL LOW (ref 8.9–10.3)
Chloride: 99 mmol/L (ref 98–111)
Creatinine, Ser: 1.16 mg/dL (ref 0.61–1.24)
GFR, Estimated: >60 mL/min (ref >60)
Glucose, Bld: 129 mg/dL — ABNORMAL HIGH (ref 70–99)
Potassium: 3.9 mmol/L (ref 3.5–5.1)
Sodium: 135 mmol/L (ref 135–145)
Total Bilirubin: 0.5 mg/dL (ref 0.3–1.2)
Total Protein: 6.1 g/dL — ABNORMAL LOW (ref 6.5–8.1)

## 2023-06-24 MED ORDER — OXYCODONE HCL 5 MG PO TABS: 5.0000 mg | ORAL_TABLET | ORAL | 0 refills | Status: DC | PRN

## 2023-06-24 MED ORDER — AMOXICILLIN 500 MG PO CAPS: 1000.0000 mg | ORAL_CAPSULE | Freq: Three times a day (TID) | ORAL | 0 refills | Status: DC

## 2023-06-24 MED ORDER — SULFAMETHOXAZOLE-TRIMETHOPRIM 800-160 MG PO TABS: 1.0000 | ORAL_TABLET | Freq: Two times a day (BID) | ORAL | 0 refills | Status: DC

## 2023-06-24 NOTE — Plan of Care (Signed)

## 2023-06-24 NOTE — Progress Notes (Signed)
Mobility Specialist Progress Note:    06/24/23 0944  Mobility  Activity Ambulated with assistance to bathroom  Level of Assistance Minimal assist, patient does 75% or more  Assistive Device Front wheel walker  Distance Ambulated (ft) 20 ft  Range of Motion/Exercises Active;All extremities  Activity Response Tolerated well  Mobility Referral Yes  $Mobility charge 1 Mobility  Mobility Specialist Start Time (ACUTE ONLY) 0940  Mobility Specialist Stop Time (ACUTE ONLY) 0948  Mobility Specialist Time Calculation (min) (ACUTE ONLY) 8 min   Pt requested assistance to bathroom, received in chair. Required MinA to stand with RW. Tolerated well, asx throughout. Returned pt to bed per request. Wife at bedside, all needs met, call bell in reach and alarm on.   Feliciana Rossetti Mobility Specialist Please contact via Special educational needs teacher or  Rehab office at [phone]

## 2023-06-24 NOTE — Care Management Important Message (Signed)
Important Message  Patient Details  Name: Frank [name] MRN: [id number] Date of Birth: [birth year]   Medicare Important Message Given:  Yes     Corey Harold 06/24/2023, 9:39 AM

## 2023-06-24 NOTE — Discharge Summary (Signed)
Physician Discharge Summary  Patient ID: Frank [name] MRN: [id number] DOB/AGE: 73-08-1951 73 y.o.  Admit date: 06/19/2023 Discharge date: 06/24/2023  Admission Diagnoses: Perforated viscus, intra-abdominal abscess  Discharge Diagnoses:  Principal Problem:   Diverticulitis of large intestine with abscess Active Problems:   GERD (gastroesophageal reflux disease)   HTN (hypertension)   Sinus bradycardia   Hypokalemia   AKI (acute kidney injury) (HCC)   Rash   SBO (small bowel obstruction) (HCC)   Perforated abdominal viscus   Diverticulitis large intestine w/o perforation or abscess w/o bleeding   Discharged Condition: good  Hospital Course: Patient is a 72 year old white male who presented to the emergency room with worsening lower abdominal pain, nausea, and vomiting.  CT scan of the abdomen revealed pneumoperitoneum with an intra-abdominal abscess.  The patient was taken to the operating room on 06/20/2023 and underwent exploratory laparotomy and drainage of intra-abdominal abscess.  It appeared that he had a perforated diverticulum at the colorectal juncture which had since healed.  No stool was present.  A drain was placed.  Patient's postoperative course was for the most part unremarkable.  His diet was advanced out difficulty once his bowel function returned.  Intraoperative cultures revealed E. coli, Enterococcus faecalis, and Enterococcus faecium.  Pharmacy ID was consulted.  The patient will be discharged home on amoxicillin and Flagyl.  The patient is being discharged home with a drain in place.   Treatments: surgery: Exploratory laparotomy, drainage of intra-abdominal abscess on 06/20/2023  Discharge Exam: Blood pressure 130/89, pulse 67, temperature 98.5 F (36.9 C), temperature source Oral, resp. rate 18, height 5\' 11"  (1.803 m), weight 86.3 kg, SpO2 96%. General appearance: alert, cooperative, and no distress Resp: clear to auscultation bilaterally Cardio: regular rate  and rhythm, S1, S2 normal, no murmur, click, rub or gallop GI: Soft, incision healing well.  JP drainage serous in nature.  Disposition: Discharge disposition: 01-Home or Self Care       Discharge Instructions     Diet - low sodium heart healthy   Complete by: As directed    Increase activity slowly   Complete by: As directed       Allergies as of 06/24/2023       Reactions   Metformin    Ampicillin Rash        Medication List     STOP taking these medications    oxyCODONE-acetaminophen 5-325 MG tablet Commonly known as: PERCOCET/ROXICET       TAKE these medications    amLODipine 5 MG tablet Commonly known as: NORVASC Take 5 mg by mouth daily.   amoxicillin 500 MG capsule Commonly known as: AMOXIL Take 2 capsules (1,000 mg total) by mouth 3 (three) times daily.   aspirin EC 81 MG tablet Take 1 tablet (81 mg total) by mouth daily. Swallow whole.   chlorthalidone 25 MG tablet Commonly known as: HYGROTON Take 25 mg by mouth daily.   cyclobenzaprine 5 MG tablet Commonly known as: FLEXERIL Take 1 tablet (5 mg total) by mouth 3 (three) times daily as needed for muscle spasms.   docusate sodium 100 MG capsule Commonly known as: COLACE Take 1 capsule (100 mg total) by mouth 2 (two) times daily.   ezetimibe 10 MG tablet Commonly known as: ZETIA Take 10 mg by mouth at bedtime.   Fish Oil 1000 MG Caps Take 1,000 mg by mouth daily.   GINGER PO Take 1 capsule by mouth daily.   Glucosamine HCl 1000 MG Tabs Take 2,000  mg by mouth daily.   losartan 100 MG tablet Commonly known as: COZAAR Take 100 mg by mouth daily.   multivitamin with minerals Tabs tablet Take 1 tablet by mouth daily.   omeprazole 20 MG capsule Commonly known as: PRILOSEC Take 20 mg by mouth daily.   oxyCODONE 5 MG immediate release tablet Commonly known as: Roxicodone Take 1 tablet (5 mg total) by mouth every 4 (four) hours as needed.   pioglitazone 15 MG tablet Commonly  known as: ACTOS Take 15 mg by mouth daily.   sulfamethoxazole-trimethoprim 800-160 MG tablet Commonly known as: BACTRIM DS Take 1 tablet by mouth 2 (two) times daily.   TART CHERRY ADVANCED PO Take 1 tablet by mouth daily.   TURMERIC PO Take 1 capsule by mouth daily.        Follow-up Information     Pappayliou, Gustavus Messing, DO. Schedule an appointment as soon as possible for a visit on 06/30/2023.   Specialty: General Surgery Contact information: [address] [facility] [address] [phone]                 Signed: [name] 06/24/2023, 8:53 AM

## 2023-06-25 LAB — AEROBIC/ANAEROBIC CULTURE W GRAM STAIN (SURGICAL/DEEP WOUND)

## 2023-06-30 ENCOUNTER — Encounter: Payer: Self-pay | Admitting: Surgery

## 2023-06-30 ENCOUNTER — Ambulatory Visit (INDEPENDENT_AMBULATORY_CARE_PROVIDER_SITE_OTHER): Payer: PPO | Admitting: Surgery

## 2023-06-30 VITALS — BP 114/68 | HR 98 | Temp 98.0°F | Resp 12 | Ht 71.0 in | Wt 180.0 lb

## 2023-06-30 DIAGNOSIS — Z09 Encounter for follow-up examination after completed treatment for conditions other than malignant neoplasm: Secondary | ICD-10-CM

## 2023-06-30 NOTE — Progress Notes (Signed)
[facility] Note   HPI:  72 y.o. Male presents to clinic for post-op follow-up status post exploratory laparotomy and drainage of intra-abdominal abscess on 7/20 for perforated colorectal diverticulitis with abscess.  Patient has been doing well since he left the hospital.  He is tolerating a diet without nausea and vomiting, and continuing to move his bowels.  He has some pain along his incision site and at his JP drain site, but is otherwise doing well.  His JP drain has put out only 10 to 15 cc of serous output in the last 2 days.  He denies fevers and chills.  Review of Systems:  All other review of systems: otherwise negative   Vital Signs:  BP 114/68   Pulse 98   Temp 98 F (36.7 C) (Oral)   Resp 12   Ht 5\' 11"  (1.803 m)   Wt 180 lb (81.6 kg)   SpO2 94%   BMI 25.10 kg/m    Physical Exam:  Physical Exam Vitals reviewed.  Constitutional:      Appearance: Normal appearance.  Abdominal:     Comments: Abdomen soft, nondistended, no percussion tenderness, nontender to palpation; no rigidity, guarding, rebound tenderness; midline incision C/D/I with skin staples in place, mild erythema and fullness; JP drain in right lower quadrant with minimal serous drainage in bulb  Neurological:     Mental Status: He is alert.     Laboratory studies: None  Imaging:  None  Assessment:  72 y.o. yo Male who presents for follow-up status post exploratory laparotomy with drainage of intra-abdominal abscess on 7/20.  Plan:  -Patient is overall doing well since surgery.  Tolerating a diet, moving his bowels, and pain well-controlled -Given minimal output, JP drain was removed today.  Advised that they should hold off on putting direct water on this area until it closes up.  He can have a small amount of serous drainage from the area for the next day or 2 -Follow up with Dr. Lovell Sheehan next week for staple removal  All of the above recommendations were discussed with the  patient and patient's family, and all of patient's and family's questions were answered to their expressed satisfaction.  Theophilus Kinds, DO [facility] [address] [place], [place] [address] [phone] (office)

## 2023-07-01 ENCOUNTER — Telehealth: Payer: Self-pay | Admitting: *Deleted

## 2023-07-01 MED ORDER — METHYLPREDNISOLONE 4 MG PO TBPK: ORAL_TABLET | ORAL | 0 refills | Status: DC

## 2023-07-01 NOTE — Telephone Encounter (Signed)
Patient was discharged with the following prescriptions on 06/24/2023:  Amoxicillin 500mg - 2 caps PO TID x7 days, #42  Bactrim DS 800- 160mg - 1 tab PO BID x7 days, #14  Patient has completed Bactrim, but patient spouse stopped Amoxicillin today (3 doses remaining).

## 2023-07-01 NOTE — Telephone Encounter (Signed)
Surgical Date: 06/20/2023 Procedure: EXPLORATORY LAPAROTOMY DRAIN ABDOMINAL ABSCESS   Received call from patient spouse, [place] [phone]- [phone]~ telephone.   Spouse reports that patient had previous allergy of ampicillin that was found to be clinically insignificant as he was able to tolerate amoxicillin. States that patient was discharged with Rx for Amoxicillin and Bactrim DS on 06/24/2023.  Reports that patient complained of itching on 06/30/2023. States that she noted slight redness to back that appeared to be a rash. States that patient is noted to have red rash to torso, back, arms and legs this morning. Reports that patient has whelp like areas to bilateral lower legs but are white instead of red. Reports that this could be from pressure from his clothing. States that patient reports intense itching as well.   Spouse reports that she has been giving patient Benadryl 25mg  PRN for itching and possible reaction.  Please advise.

## 2023-07-01 NOTE — Telephone Encounter (Signed)
Discussed with Dr. Henreitta Leber and recommendations are as follows: Medrol Dose Pack for reaction  Dr. Henreitta Leber discussed case with pharmacy. Linezolid PO is recommended if more ABTx is required.   Patient is clinically doing well and will not require further ABTx at this time.   Prescription for Medrol Dose Pack sent to patient pharmacy.   Call placed to patient and patient spouse, Louie Casa made aware.

## 2023-07-03 DIAGNOSIS — M4316 Spondylolisthesis, lumbar region: Secondary | ICD-10-CM | POA: Diagnosis not present

## 2023-07-07 ENCOUNTER — Ambulatory Visit (INDEPENDENT_AMBULATORY_CARE_PROVIDER_SITE_OTHER): Payer: PPO | Admitting: General Surgery

## 2023-07-07 ENCOUNTER — Encounter: Payer: Self-pay | Admitting: General Surgery

## 2023-07-07 VITALS — BP 125/80 | HR 60 | Temp 98.1°F | Resp 12 | Ht 71.0 in | Wt 176.0 lb

## 2023-07-07 DIAGNOSIS — Z09 Encounter for follow-up examination after completed treatment for conditions other than malignant neoplasm: Secondary | ICD-10-CM

## 2023-07-07 NOTE — Progress Notes (Signed)
Subjective:     Frank [name]  Here for follow-up, status post exploratory laparotomy.  Patient is doing well.  He denies any fever or chills.  He denies any incisional or abdominal pain. Objective:    BP 125/80   Pulse 60   Temp 98.1 F (36.7 C) (Oral)   Resp 12   Ht 5\' 11"  (1.803 m)   Wt 176 lb (79.8 kg)   SpO2 94%   BMI 24.55 kg/m   General:  alert, cooperative, and no distress  Abdomen is soft, incision healing well.  Staples removed.     Assessment:    Doing well postoperatively.    Plan:   Would avoid heavy lifting over 25 pounds for the next few weeks.  Gradually resume normal activity as able given his recent back surgery.  Follow-up here as needed.

## 2023-07-16 ENCOUNTER — Other Ambulatory Visit: Payer: Self-pay

## 2023-07-16 ENCOUNTER — Encounter: Payer: Self-pay | Admitting: Emergency Medicine

## 2023-07-16 ENCOUNTER — Ambulatory Visit
Admission: EM | Admit: 2023-07-16 | Discharge: 2023-07-16 | Disposition: A | Discharge status: Home / Self Care | Payer: PPO | Attending: Family Medicine | Admitting: Family Medicine

## 2023-07-16 ENCOUNTER — Telehealth: Payer: Self-pay | Admitting: *Deleted

## 2023-07-16 DIAGNOSIS — L239 Allergic contact dermatitis, unspecified cause: Secondary | ICD-10-CM | POA: Diagnosis not present

## 2023-07-16 MED ORDER — METHYLPREDNISOLONE SODIUM SUCC 125 MG IJ SOLR
80.0000 mg | Freq: Once | INTRAMUSCULAR | Status: AC
  Administered 2023-07-16: 80 mg via INTRAMUSCULAR

## 2023-07-16 MED ORDER — TRIAMCINOLONE ACETONIDE 0.1 % EX CREA: 1.0000 | TOPICAL_CREAM | Freq: Two times a day (BID) | CUTANEOUS | 0 refills | Status: AC | PRN

## 2023-07-16 NOTE — Telephone Encounter (Signed)
Surgical Date: 06/20/2023 Procedure: EXPLORATORY LAPAROTOMY DRAIN ABDOMINAL ABSCESS    Received call from patient spouse, [place] [phone]- [phone]~ telephone.   On 07/01/2023, patient spouse called office with concerns for possible allergic reaction to oral ABTx due to itchy rash on patient torso, back, arms, and legs. On call provider agreeable to stop ABTx as patient was on the last day, and steroid dose pack was prescribed.   Patient spouse Louie Casa reports that patient has completed dose pack. Rash and itching did resolve. States that rash has returned today with intense itching. Requested another prescription for steroids.   Advised that ABTx were completed x2 weeks prior and should be out of system, therefore they should not be causing any reaction at this time.   Advised that patient will need to be seen by either PCP or UC for evaluation. Verbalized understanding.

## 2023-07-16 NOTE — ED Triage Notes (Signed)
Pt reports rash to right arm, lower abdomen, bilateral upper thighs. Pt reports took percocet yesterday and reports "haven't had that in awhile." Denies any new self care products.

## 2023-07-16 NOTE — ED Provider Notes (Signed)
[facility]    CSN: [id number] Arrival date & time: 07/16/23  1733      History   Chief Complaint Chief Complaint  Patient presents with   Rash    HPI Frank [name] is a 72 y.o. male.   Patient presenting today with itchy red rash to bilateral arms, tops of legs, lower abdomen.  Denies any new medications, outdoor exposures, hygiene products or cleaning products and denies throat itching or swelling, chest tightness, shortness of breath, nausea, vomiting.  So far not tried anything over-the-counter for symptoms.    Past Medical History:  Diagnosis Date   Arthritis    Barrett's esophagus    BPH (benign prostatic hyperplasia)    Chronic kidney disease    Fracture of 5th metatarsal    GERD (gastroesophageal reflux disease)    Hypercholesteremia    Hypertension    Mastoiditis    Pre-diabetes    Ulcer disease    Bulbar    Patient Active Problem List   Diagnosis Date Noted   Diverticulitis of large intestine with abscess 06/20/2023   Sinus bradycardia 06/20/2023   Hypokalemia 06/20/2023   AKI (acute kidney injury) (HCC) 06/20/2023   Rash 06/20/2023   SBO (small bowel obstruction) (HCC) 06/20/2023   Perforated abdominal viscus 06/20/2023   Diverticulitis large intestine w/o perforation or abscess w/o bleeding 06/20/2023   Spondylolisthesis of lumbar region 06/11/2023   Arthropathy of lumbar facet joint 04/02/2023   History of bilateral mastoidectomy 09/19/2021   Mastoid disorder, left 09/16/2019   Bilateral sensorineural hearing loss 09/16/2019   Left foot pain 02/14/2014   Fracture of 5th metatarsal    BPH (benign prostatic hyperplasia)    Hypertension    Barrett's esophagus    Ulcer disease    GERD (gastroesophageal reflux disease) 05/24/2012   HTN (hypertension) 05/24/2012   History of colonic polyps 05/24/2012    Past Surgical History:  Procedure Laterality Date   BACK SURGERY     BIOPSY  12/12/2021   Procedure: BIOPSY;  Surgeon: Malissa Hippo, MD;  [facility];  Service: Endoscopy;;   COLONOSCOPY     COLONOSCOPY     COLONOSCOPY N/A 08/27/2016   Procedure: COLONOSCOPY;  Surgeon: Malissa Hippo, MD;  [facility];  Service: Endoscopy;  Laterality: N/A;  930 - moved to 9/27 @ 12:00 - Ann notified pt   COLONOSCOPY N/A 12/12/2021   Procedure: COLONOSCOPY;  Surgeon: Malissa Hippo, MD;  [facility];  Service: Endoscopy;  Laterality: N/A;  730   HEMORRHOID SURGERY  12/02/2007   INGUINAL HERNIA REPAIR Right 10/15/2020   Procedure: OPEN RIGHT INGUINAL HERNIA REPAIR WITH MESH;  Surgeon: Ovidio Kin, MD;  Location: [facility] OR;  Service: General;  Laterality: Right;   INSERTION OF MESH Right 10/15/2020   Procedure: INSERTION OF MESH;  Surgeon: Ovidio Kin, MD;  Location: [facility] OR;  Service: General;  Laterality: Right;   KNEE ARTHROSCOPY     Right Knee   LAPAROTOMY N/A 06/20/2023   Procedure: EXPLORATORY LAPAROTOMY DRAIN ABDOMINAL ABSCESS;  Surgeon: [name], MD;  Location: [facility];  Service: General;  Laterality: N/A;   ORIF TOE FRACTURE Left 12/09/2013   Procedure: LEFT OPEN REDUCTION INTERNAL FIXATION (ORIF) FIFTH METATARSAL FRACTURE;  Surgeon: Nilda Simmer, MD;  Location: [facility];  Service: Orthopedics;  Laterality: Left;   POLYPECTOMY  08/27/2016   Procedure: POLYPECTOMY;  Surgeon: Malissa Hippo, MD;  [facility];  Service: Endoscopy;;  colon   POLYPECTOMY  12/12/2021   Procedure: POLYPECTOMY;  Surgeon: Malissa Hippo, MD;  [facility];  Service: Endoscopy;;   rotor cuff  12/02/2007   right-   TONSILLECTOMY     UPPER GASTROINTESTINAL ENDOSCOPY         Home Medications    Prior to Admission medications   Medication Sig Start Date End Date Taking? Authorizing Provider  triamcinolone cream (KENALOG) 0.1 % Apply 1 Application topically 2 (two) times daily as needed. 07/16/23  Yes Particia Nearing, PA-C  amLODipine (NORVASC) 5 MG  tablet Take 5 mg by mouth daily.    [provider]  aspirin EC 81 MG tablet Take 1 tablet (81 mg total) by mouth daily. Swallow whole. 12/13/21   Rehman, Joline Maxcy, MD  chlorthalidone (HYGROTON) 25 MG tablet Take 25 mg by mouth daily. 02/23/19   [provider]  cyclobenzaprine (FLEXERIL) 5 MG tablet Take 1 tablet (5 mg total) by mouth 3 (three) times daily as needed for muscle spasms. 06/12/23   Tressie Stalker, MD  docusate sodium (COLACE) 100 MG capsule Take 1 capsule (100 mg total) by mouth 2 (two) times daily. 06/12/23   Tressie Stalker, MD  ezetimibe (ZETIA) 10 MG tablet Take 10 mg by mouth at bedtime. 08/04/20   [provider]  Ginger, Zingiber officinalis, (GINGER PO) Take 1 capsule by mouth daily.    [provider]  Glucosamine HCl 1000 MG TABS Take 2,000 mg by mouth daily.    [provider]  losartan (COZAAR) 100 MG tablet Take 100 mg by mouth daily.    [provider]  methylPREDNISolone (MEDROL DOSEPAK) 4 MG TBPK tablet Use as directed on package. Patient not taking: Reported on 07/07/2023 07/01/23   Lucretia Roers, MD  Misc Natural Products (TART CHERRY ADVANCED PO) Take 1 tablet by mouth daily.    [provider]  Multiple Vitamin (MULTIVITAMIN WITH MINERALS) TABS tablet Take 1 tablet by mouth daily.    [provider]  Omega-3 Fatty Acids (FISH OIL) 1000 MG CAPS Take 1,000 mg by mouth daily.    [provider]  omeprazole (PRILOSEC) 20 MG capsule Take 20 mg by mouth daily.  04/04/19   [provider]  oxyCODONE (ROXICODONE) 5 MG immediate release tablet Take 1 tablet (5 mg total) by mouth every 4 (four) hours as needed. Patient not taking: Reported on 07/07/2023 06/24/23 06/23/24  [name], MD  pioglitazone (ACTOS) 15 MG tablet Take 15 mg by mouth daily.    [provider]  TURMERIC PO Take 1 capsule by mouth daily.    [provider]    Family History Family History   Problem Relation Age of Onset   COPD Mother    Heart disease Father    Healthy Sister    Lung cancer Brother    Heart disease Brother    Heart disease Sister    Prostate cancer Brother    Healthy Son    Healthy Son     Social History Social History   Tobacco Use   Smoking status: Never   Smokeless tobacco: Never  Vaping Use   Vaping status: Never Used  Substance Use Topics   Alcohol use: No   Drug use: No     Allergies   Metformin and Amoxicillin   Review of Systems Review of Systems Per HPI  Physical Exam Triage Vital Signs ED Triage Vitals  Encounter Vitals Group  BP 07/16/23 1743 100/66     Systolic BP Percentile --      Diastolic BP Percentile --      Pulse Rate 07/16/23 1743 92     Resp 07/16/23 1743 16     Temp 07/16/23 1743 99.3 F (37.4 C)     Temp Source 07/16/23 1743 Oral     SpO2 07/16/23 1743 95 %     Weight --      Height --      Head Circumference --      Peak Flow --      Pain Score 07/16/23 1742 0     Pain Loc --      Pain Education --      Exclude from Growth Chart --    No data found.  Updated Vital Signs BP 100/66 (BP Location: Right Arm)   Pulse 92   Temp 99.3 F (37.4 C) (Oral)   Resp 16   SpO2 95%   Visual Acuity Right Eye Distance:   Left Eye Distance:   Bilateral Distance:    Right Eye Near:   Left Eye Near:    Bilateral Near:     Physical Exam Vitals and nursing note reviewed.  Constitutional:      Appearance: Normal appearance.  HENT:     Head: Atraumatic.     Mouth/Throat:     Mouth: Mucous membranes are moist.     Pharynx: Oropharynx is clear.  Eyes:     Extraocular Movements: Extraocular movements intact.     Conjunctiva/sclera: Conjunctivae normal.  Cardiovascular:     Rate and Rhythm: Normal rate.  Pulmonary:     Effort: Pulmonary effort is normal.     Breath sounds: Normal breath sounds. No wheezing or rales.  Musculoskeletal:        General: Normal range of motion.     Cervical back:  Normal range of motion and neck supple.  Skin:    General: Skin is warm and dry.     Findings: Rash present.     Comments: Erythematous maculopapular rash across upper extremities, lower extremities, lower abdomen  Neurological:     General: No focal deficit present.     Mental Status: He is oriented to person, place, and time.     Motor: No weakness.     Gait: Gait normal.  Psychiatric:        Mood and Affect: Mood normal.        Thought Content: Thought content normal.        Judgment: Judgment normal.      UC Treatments / Results  Labs (all labs ordered are listed, but only abnormal results are displayed) Labs Reviewed - No data to display  EKG   Radiology No results found.  Procedures Procedures (including critical care time)  Medications Ordered in UC Medications  methylPREDNISolone sodium succinate (SOLU-MEDROL) 125 mg/2 mL injection 80 mg (has no administration in time range)    Initial Impression / Assessment and Plan / UC Course  I have reviewed the triage vital signs and the nursing notes.  Pertinent labs & imaging results that were available during my care of the patient were reviewed by me and considered in my medical decision making (see chart for details).     Suspect allergic dermatitis, treat with IM Solu-Medrol, antihistamines, triamcinolone cream and avoidance of any potential irritants.  Return for worsening symptoms.  Final Clinical Impressions(s) / UC Diagnoses   Final diagnoses:  Allergic dermatitis  Discharge Instructions   None    ED Prescriptions     Medication Sig Dispense Auth. Provider   triamcinolone cream (KENALOG) 0.1 % Apply 1 Application topically 2 (two) times daily as needed. 80 g Particia Nearing, [place]      PDMP not reviewed this encounter.   Particia Nearing, [place] 07/16/23 1800

## 2023-07-23 DIAGNOSIS — E785 Hyperlipidemia, unspecified: Secondary | ICD-10-CM | POA: Diagnosis not present

## 2023-07-23 DIAGNOSIS — N400 Enlarged prostate without lower urinary tract symptoms: Secondary | ICD-10-CM | POA: Diagnosis not present

## 2023-07-23 DIAGNOSIS — I700 Atherosclerosis of aorta: Secondary | ICD-10-CM | POA: Diagnosis not present

## 2023-07-23 DIAGNOSIS — Z79899 Other long term (current) drug therapy: Secondary | ICD-10-CM | POA: Diagnosis not present

## 2023-07-23 DIAGNOSIS — I10 Essential (primary) hypertension: Secondary | ICD-10-CM | POA: Diagnosis not present

## 2023-07-23 DIAGNOSIS — N1831 Chronic kidney disease, stage 3a: Secondary | ICD-10-CM | POA: Diagnosis not present

## 2023-07-23 DIAGNOSIS — Z125 Encounter for screening for malignant neoplasm of prostate: Secondary | ICD-10-CM | POA: Diagnosis not present

## 2023-07-23 DIAGNOSIS — K219 Gastro-esophageal reflux disease without esophagitis: Secondary | ICD-10-CM | POA: Diagnosis not present

## 2023-07-23 DIAGNOSIS — E1129 Type 2 diabetes mellitus with other diabetic kidney complication: Secondary | ICD-10-CM | POA: Diagnosis not present

## 2023-07-24 ENCOUNTER — Encounter (HOSPITAL_COMMUNITY): Payer: Self-pay

## 2023-07-24 DIAGNOSIS — M7542 Impingement syndrome of left shoulder: Secondary | ICD-10-CM | POA: Diagnosis not present

## 2023-07-24 NOTE — Therapy (Signed)
[facility] Outpatient Rehabilitation at [facility] [address] [place], [place], [address] Phone: [phone]   Fax:  [phone]  Patient Details  Name: Frank [name] MRN: [id number] Date of Birth: 25-Jun-1951 Referring Provider:  No ref. provider found PHYSICAL THERAPY DISCHARGE SUMMARY  Visits from Start of Care: 3  Current functional level related to goals / functional outcomes: unknown   Remaining deficits: unknown   Education / Equipment: unknown   Patient agrees to discharge. Patient goals were not met. Patient is being discharged due to not returning since the last visit.   [facility] Out[facility] Rehabilitation at [facility] [address] [place], [place], [address] Phone: [phone]   Fax:  [phone]

## 2023-07-30 DIAGNOSIS — Z23 Encounter for immunization: Secondary | ICD-10-CM | POA: Diagnosis not present

## 2023-07-30 DIAGNOSIS — I10 Essential (primary) hypertension: Secondary | ICD-10-CM | POA: Diagnosis not present

## 2023-07-30 DIAGNOSIS — T466X5A Adverse effect of antihyperlipidemic and antiarteriosclerotic drugs, initial encounter: Secondary | ICD-10-CM | POA: Diagnosis not present

## 2023-07-30 DIAGNOSIS — I700 Atherosclerosis of aorta: Secondary | ICD-10-CM | POA: Diagnosis not present

## 2023-07-30 DIAGNOSIS — I493 Ventricular premature depolarization: Secondary | ICD-10-CM | POA: Diagnosis not present

## 2023-07-30 DIAGNOSIS — E1122 Type 2 diabetes mellitus with diabetic chronic kidney disease: Secondary | ICD-10-CM | POA: Diagnosis not present

## 2023-07-30 DIAGNOSIS — Z0001 Encounter for general adult medical examination with abnormal findings: Secondary | ICD-10-CM | POA: Diagnosis not present

## 2023-07-30 DIAGNOSIS — E785 Hyperlipidemia, unspecified: Secondary | ICD-10-CM | POA: Diagnosis not present

## 2023-08-21 DIAGNOSIS — M7542 Impingement syndrome of left shoulder: Secondary | ICD-10-CM | POA: Diagnosis not present

## 2023-09-14 DIAGNOSIS — M5431 Sciatica, right side: Secondary | ICD-10-CM | POA: Diagnosis not present

## 2023-09-14 DIAGNOSIS — M7542 Impingement syndrome of left shoulder: Secondary | ICD-10-CM | POA: Diagnosis not present

## 2023-09-14 DIAGNOSIS — M25551 Pain in right hip: Secondary | ICD-10-CM | POA: Diagnosis not present

## 2023-09-16 DIAGNOSIS — H903 Sensorineural hearing loss, bilateral: Secondary | ICD-10-CM | POA: Diagnosis not present

## 2023-09-22 DIAGNOSIS — M25512 Pain in left shoulder: Secondary | ICD-10-CM | POA: Diagnosis not present

## 2023-09-30 DIAGNOSIS — S46012A Strain of muscle(s) and tendon(s) of the rotator cuff of left shoulder, initial encounter: Secondary | ICD-10-CM | POA: Diagnosis not present

## 2023-10-02 DIAGNOSIS — M4316 Spondylolisthesis, lumbar region: Secondary | ICD-10-CM | POA: Diagnosis not present

## 2023-11-06 DIAGNOSIS — Z9089 Acquired absence of other organs: Secondary | ICD-10-CM | POA: Diagnosis not present

## 2023-11-06 DIAGNOSIS — H6993 Unspecified Eustachian tube disorder, bilateral: Secondary | ICD-10-CM | POA: Diagnosis not present

## 2023-11-06 DIAGNOSIS — N200 Calculus of kidney: Secondary | ICD-10-CM | POA: Insufficient documentation

## 2023-11-06 DIAGNOSIS — H7202 Central perforation of tympanic membrane, left ear: Secondary | ICD-10-CM | POA: Insufficient documentation

## 2023-11-06 DIAGNOSIS — H903 Sensorineural hearing loss, bilateral: Secondary | ICD-10-CM | POA: Diagnosis not present

## 2023-11-06 DIAGNOSIS — H7493 Unspecified disorder of middle ear and mastoid, bilateral: Secondary | ICD-10-CM | POA: Diagnosis not present

## 2023-11-06 DIAGNOSIS — L570 Actinic keratosis: Secondary | ICD-10-CM | POA: Insufficient documentation

## 2023-11-06 DIAGNOSIS — H906 Mixed conductive and sensorineural hearing loss, bilateral: Secondary | ICD-10-CM | POA: Diagnosis not present

## 2023-11-06 NOTE — Progress Notes (Unsigned)
Name: Frank [name] DOB: 1951-08-20 MRN: [id number]  History of Present Illness: Frank [name] is a 72 y.o. male who presents today for new patient visit at [facility]. - GU history: 1. BPH with LUTS (frequency). - 06/19/2023: CT abdomen/pelvis w/ contrast showed mild prostatomegaly. - Taking Flomax. - No PSA results found per chart review; patient states that has been normal.  - He denies prior GU surgery. - He reports known family history of prostate cancer (brother). 2. Kidney stones. - 06/19/2023: CT abdomen/pelvis w/ contrast showed bilateral non-obstructive kidney stones.  Recent history: > 06/20/2023: Underwent emergency exploratory laparotomy with drainage of intra-abdominal abscess by Dr. Lovell Sheehan for small bowel obstruction with perforated colorectal diverticulitis and intra-abdominal abscess with associated partial small bowel obstruction.  Today: He reports bothersome nocturia x3-4. Denies bothersome daytime urinary frequency, urgency, dysuria, gross hematuria, weak urinary stream, hesitancy, straining to void, or sensations of incomplete emptying.   He denies history of obstructive sleep apnea; denies ever having a sleep study before. - Reports drinking 2-3 Diet Dr. Alcus Dad per day. He reports limiting his fluid intake within 3 hours prior to bedtime and overnight.  He denies routinely experiencing lower extremity edema during the day.   Fall Screening: Do you usually have a device to assist in your mobility? No   Medications: Current Outpatient Medications  Medication Sig Dispense Refill   amLODipine (NORVASC) 5 MG tablet Take 5 mg by mouth daily.     aspirin EC 81 MG tablet Take 1 tablet (81 mg total) by mouth daily. Swallow whole. 30 tablet 11   chlorthalidone (HYGROTON) 25 MG tablet Take 25 mg by mouth daily.     cyclobenzaprine (FLEXERIL) 5 MG tablet Take 1 tablet (5 mg total) by mouth 3 (three) times daily as needed for muscle spasms. 50  tablet 0   docusate sodium (COLACE) 100 MG capsule Take 1 capsule (100 mg total) by mouth 2 (two) times daily. 30 capsule 0   ezetimibe (ZETIA) 10 MG tablet Take 10 mg by mouth at bedtime.     Ginger, Zingiber officinalis, (GINGER PO) Take 1 capsule by mouth daily.     Glucosamine HCl 1000 MG TABS Take 2,000 mg by mouth daily.     losartan (COZAAR) 100 MG tablet Take 100 mg by mouth daily.     methylPREDNISolone (MEDROL DOSEPAK) 4 MG TBPK tablet Use as directed on package. 21 tablet 0   Misc Natural Products (TART CHERRY ADVANCED PO) Take 1 tablet by mouth daily.     Multiple Vitamin (MULTIVITAMIN WITH MINERALS) TABS tablet Take 1 tablet by mouth daily.     Omega-3 Fatty Acids (FISH OIL) 1000 MG CAPS Take 1,000 mg by mouth daily.     omeprazole (PRILOSEC) 20 MG capsule Take 20 mg by mouth daily.      oxybutynin (DITROPAN) 5 MG tablet Take 1 tablet (5 mg total) by mouth at bedtime. 30 tablet 2   oxyCODONE (ROXICODONE) 5 MG immediate release tablet Take 1 tablet (5 mg total) by mouth every 4 (four) hours as needed. 15 tablet 0   pioglitazone (ACTOS) 15 MG tablet Take 15 mg by mouth daily.     triamcinolone cream (KENALOG) 0.1 % Apply 1 Application topically 2 (two) times daily as needed. 80 g 0   TURMERIC PO Take 1 capsule by mouth daily.     No current facility-administered medications for this visit.    Allergies: Allergies  Allergen Reactions   Metformin  Amoxicillin Rash    Past Medical History:  Diagnosis Date   Arthritis    Barrett's esophagus    BPH (benign prostatic hyperplasia)    Chronic kidney disease    Fracture of 5th metatarsal    GERD (gastroesophageal reflux disease)    Hypercholesteremia    Hypertension    Mastoiditis    Pre-diabetes    Ulcer disease    Bulbar   Past Surgical History:  Procedure Laterality Date   BACK SURGERY     BIOPSY  12/12/2021   Procedure: BIOPSY;  Surgeon: Malissa Hippo, MD;  [facility];  Service: Endoscopy;;    COLONOSCOPY     COLONOSCOPY     COLONOSCOPY N/A 08/27/2016   Procedure: COLONOSCOPY;  Surgeon: Malissa Hippo, MD;  [facility];  Service: Endoscopy;  Laterality: N/A;  930 - moved to 9/27 @ 12:00 - Ann notified pt   COLONOSCOPY N/A 12/12/2021   Procedure: COLONOSCOPY;  Surgeon: Malissa Hippo, MD;  [facility];  Service: Endoscopy;  Laterality: N/A;  730   HEMORRHOID SURGERY  12/02/2007   INGUINAL HERNIA REPAIR Right 10/15/2020   Procedure: OPEN RIGHT INGUINAL HERNIA REPAIR WITH MESH;  Surgeon: Ovidio Kin, MD;  Location: [facility] OR;  Service: General;  Laterality: Right;   INSERTION OF MESH Right 10/15/2020   Procedure: INSERTION OF MESH;  Surgeon: Ovidio Kin, MD;  Location: [facility] OR;  Service: General;  Laterality: Right;   KNEE ARTHROSCOPY     Right Knee   LAPAROTOMY N/A 06/20/2023   Procedure: EXPLORATORY LAPAROTOMY DRAIN ABDOMINAL ABSCESS;  Surgeon: [name], MD;  Location: [facility];  Service: General;  Laterality: N/A;   ORIF TOE FRACTURE Left 12/09/2013   Procedure: LEFT OPEN REDUCTION INTERNAL FIXATION (ORIF) FIFTH METATARSAL FRACTURE;  Surgeon: Nilda Simmer, MD;  Location: [facility];  Service: Orthopedics;  Laterality: Left;   POLYPECTOMY  08/27/2016   Procedure: POLYPECTOMY;  Surgeon: Malissa Hippo, MD;  [facility];  Service: Endoscopy;;  colon   POLYPECTOMY  12/12/2021   Procedure: POLYPECTOMY;  Surgeon: Malissa Hippo, MD;  [facility];  Service: Endoscopy;;   rotor cuff  12/02/2007   right-   TONSILLECTOMY     UPPER GASTROINTESTINAL ENDOSCOPY     Family History  Problem Relation Age of Onset   COPD Mother    Heart disease Father    Healthy Sister    Lung cancer Brother    Heart disease Brother    Heart disease Sister    Prostate cancer Brother    Healthy Son    Healthy Son    Social History   Socioeconomic History   Marital status: Married    Spouse name: Not on file   Number of  children: Not on file   Years of education: Not on file   Highest education level: Not on file  Occupational History   Not on file  Tobacco Use   Smoking status: Never   Smokeless tobacco: Never  Vaping Use   Vaping status: Never Used  Substance and Sexual Activity   Alcohol use: No   Drug use: No   Sexual activity: Not on file  Other Topics Concern   Not on file  Social History Narrative   Not on file   Social Determinants of Health   Financial Resource Strain: Not on file  Food Insecurity: Low Risk  (11/06/2023)   Received from [facility]  Hunger Vital Sign    Worried About Running Out of Food in the Last Year: Never true    Ran Out of Food in the Last Year: Never true  Transportation Needs: No Transportation Needs (11/06/2023)   Received from [facility]    In the past 12 months, has lack of reliable transportation kept you from medical appointments, meetings, work or from getting things needed for daily living? : No  Physical Activity: Not on file  Stress: Not on file  Social Connections: Not on file  Intimate Partner Violence: Not on file    Review of Systems Constitutional: Patient denies any unintentional weight loss or change in strength lntegumentary: Patient denies any rashes or pruritus Cardiovascular: Patient denies chest pain or syncope Respiratory: Patient denies shortness of breath Gastrointestinal: Patient denies nausea, vomiting, constipation, or diarrhea Musculoskeletal: Patient denies muscle cramps or weakness Neurologic: Patient denies convulsions or seizures Allergic/Immunologic: Patient denies recent allergic reaction(s) Hematologic/Lymphatic: Patient denies bleeding tendencies Endocrine: Patient denies heat/cold intolerance  GU: As per HPI.  OBJECTIVE Vitals:   11/09/23 1029  BP: 123/77  Pulse: 67  Temp: 98.1 F (36.7 C)   There is no height or weight on file to calculate BMI.  Physical  Examination Constitutional: No obvious distress; patient is non-toxic appearing  Cardiovascular: No visible lower extremity edema.  Respiratory: The patient does not have audible wheezing/stridor; respirations do not appear labored  Gastrointestinal: Abdomen non-distended Musculoskeletal: Normal ROM of UEs  Skin: No obvious rashes/open sores  Neurologic: CN 2-12 grossly intact Psychiatric: Answered questions appropriately with normal affect  Hematologic/Lymphatic/Immunologic: No obvious bruises or sites of spontaneous bleeding  UA: 3-10 RBC/hpf with no evidence of UTI PVR: 8 ml  ASSESSMENT Benign prostatic hyperplasia with urinary frequency - Plan: Urinalysis, Routine w reflex microscopic, PR COMPLEX UROFLOMETRY, BLADDER SCAN AMB NON-IMAGING  Kidney stones - Plan: Urinalysis, Routine w reflex microscopic, PR COMPLEX UROFLOMETRY, BLADDER SCAN AMB NON-IMAGING  Nocturia - Plan: oxybutynin (DITROPAN) 5 MG tablet  Microscopic hematuria  1. BPH with LUTS (nocturia). - Inadequate response with Flomax; no evidence of significant BOO based on normal PVR.  - For nocturia: We reviewed possible etiologies for nocturia including but not limited to: caffeine intake, evening fluid intake, sleep apnea, peripheral edema, reduced bladder capacity, renal tubular dysfunction. We discussed options for further evaluation including a sleep study to evaluate for obstructive sleep apnea, which patient was informed can contribute to nocturia and/or nocturnal polyuria. We reviewed management options including: Behavioral changes: Minimizing caffeine intake (especially within 6-8 hours before bedtime). Minimizing fluid intake within 3 hours before bedtime. Minimizing overnight fluid intake. Reducing salt / sodium intake. If bilateral lower extremity edema is present: Elevating feet during the day and/or wearing compression socks. Medications: Oxybutynin IR 5 mg nightly. Daily anticholinergic medication or  Beta-3 adrenergic agonist medication.  Pt elected to trial Oxybutynin IR 5 mg nightly at bedtime  2. Kidney stones. Asymptomatic.   3. Microscopic hematuria. Likely due to either his BPH or stones. Had no concerning urologic findings on CT with contrast in July 2024; advised cystoscopy to complete microscopic hematuria evaluation and to evaluate his prostatomegaly.   Pt verbalized understanding and agreement. All questions were answered.  PLAN Advised the following: 1. Start Oxybutynin IR 5 mg nightly at bedtime. 2. Minimize caffeine intake; avoid caffeine intake starting 6-8 hours before bedtime. 3. Minimize fluid intake starting 3 hours before bedtime and overnight. 4. Consider discussing sleep study with PCP for OSA evaluation. 5.  Return in about 6 weeks (around 12/21/2023) for f/u & cystoscopy with any urology MD.  Orders Placed This Encounter  Procedures   Urinalysis, Routine w reflex microscopic   PR COMPLEX UROFLOMETRY   BLADDER SCAN AMB NON-IMAGING    It has been explained that the patient is to follow regularly with their PCP in addition to all other providers involved in their care and to follow instructions provided by these respective offices. Patient advised to contact urology clinic if any urologic-pertaining questions, concerns, new symptoms or problems arise in the interim period.  There are no Patient Instructions on file for this visit.  Electronically signed by:  Donnita Falls, FNP   11/09/23    11:52 AM

## 2023-11-09 ENCOUNTER — Telehealth: Payer: Self-pay | Admitting: Urology

## 2023-11-09 ENCOUNTER — Encounter: Payer: Self-pay | Admitting: Urology

## 2023-11-09 ENCOUNTER — Ambulatory Visit: Payer: PPO | Admitting: Urology

## 2023-11-09 VITALS — BP 123/77 | HR 67 | Temp 98.1°F

## 2023-11-09 DIAGNOSIS — R351 Nocturia: Secondary | ICD-10-CM

## 2023-11-09 DIAGNOSIS — R3129 Other microscopic hematuria: Secondary | ICD-10-CM | POA: Diagnosis not present

## 2023-11-09 DIAGNOSIS — N401 Enlarged prostate with lower urinary tract symptoms: Secondary | ICD-10-CM | POA: Diagnosis not present

## 2023-11-09 DIAGNOSIS — M25512 Pain in left shoulder: Secondary | ICD-10-CM | POA: Diagnosis not present

## 2023-11-09 DIAGNOSIS — M19011 Primary osteoarthritis, right shoulder: Secondary | ICD-10-CM | POA: Diagnosis not present

## 2023-11-09 DIAGNOSIS — N200 Calculus of kidney: Secondary | ICD-10-CM

## 2023-11-09 DIAGNOSIS — R350 Frequency of micturition: Secondary | ICD-10-CM | POA: Diagnosis not present

## 2023-11-09 LAB — MICROSCOPIC EXAMINATION
Bacteria, UA: NONE SEEN
WBC, UA: NONE SEEN /[HPF] (ref 0–5)

## 2023-11-09 LAB — URINALYSIS, ROUTINE W REFLEX MICROSCOPIC
Bilirubin, UA: NEGATIVE
Glucose, UA: NEGATIVE
Ketones, UA: NEGATIVE
Leukocytes,UA: NEGATIVE
Nitrite, UA: NEGATIVE
Protein,UA: NEGATIVE
Specific Gravity, UA: 1.015 (ref 1.005–1.030)
Urobilinogen, Ur: 1 mg/dL (ref 0.2–1.0)
pH, UA: 6.5 (ref 5.0–7.5)

## 2023-11-09 LAB — BLADDER SCAN AMB NON-IMAGING: Scan Result: 8

## 2023-11-09 MED ORDER — OXYBUTYNIN CHLORIDE 5 MG PO TABS
5.0000 mg | ORAL_TABLET | Freq: Every evening | ORAL | 2 refills | Status: DC
Start: 2023-11-09 — End: 2023-12-25

## 2023-11-09 NOTE — Progress Notes (Signed)
Uroflow  Peak Flow: 62ml Average Flow: 8ml Voided Volume: Voiding Time: 91sec Flow Time: 42sec Time to Peak Flow: 5sec  PVR Volume: 8 ml

## 2023-11-09 NOTE — Telephone Encounter (Signed)
Pt saw Frank [name] today and she gave him something to reduce urination at night. He wants to know if he can get something for during the day also.

## 2023-11-09 NOTE — Telephone Encounter (Signed)
Return call to patient. Patient states his wife is a Engineer, civil (consulting) and made him aware that the Oxybutynin Maralyn Sago prescribe will work over the course of 24 hours. Patient states his wife answer his question and do not needed anything else. Patient voiced understanding

## 2023-12-02 HISTORY — PX: ELBOW SURGERY: SHX618

## 2023-12-16 ENCOUNTER — Telehealth: Payer: Self-pay | Admitting: Urology

## 2023-12-16 NOTE — Telephone Encounter (Signed)
 Patient states oxybutin was working but stopped working wants to know if he can increase dose

## 2023-12-16 NOTE — Telephone Encounter (Signed)
**Note De-identified  [place] Obfuscation** Please advise 

## 2023-12-16 NOTE — Telephone Encounter (Signed)
 Called Pt to relay message from MD Pt said he'd give that a try

## 2023-12-17 DIAGNOSIS — M19012 Primary osteoarthritis, left shoulder: Secondary | ICD-10-CM | POA: Diagnosis not present

## 2023-12-17 DIAGNOSIS — G8918 Other acute postprocedural pain: Secondary | ICD-10-CM | POA: Diagnosis not present

## 2023-12-17 DIAGNOSIS — M75122 Complete rotator cuff tear or rupture of left shoulder, not specified as traumatic: Secondary | ICD-10-CM | POA: Diagnosis not present

## 2023-12-21 ENCOUNTER — Ambulatory Visit (HOSPITAL_COMMUNITY): Payer: PPO | Admitting: Occupational Therapy

## 2023-12-25 ENCOUNTER — Telehealth: Payer: Self-pay | Admitting: Urology

## 2023-12-25 ENCOUNTER — Other Ambulatory Visit: Payer: Self-pay | Admitting: Urology

## 2023-12-25 DIAGNOSIS — R351 Nocturia: Secondary | ICD-10-CM

## 2023-12-25 MED ORDER — OXYBUTYNIN CHLORIDE 5 MG PO TABS
10.0000 mg | ORAL_TABLET | Freq: Every evening | ORAL | 0 refills | Status: AC
Start: 2023-12-25 — End: 2024-01-01

## 2023-12-25 NOTE — Telephone Encounter (Signed)
Return call to patient. Patient states that Maralyn Sago had a conversation to increase his Ditropan from 5mg  to 10mg . Patient is made aware that he last saw Maralyn Sago in December and there is nothing document for him to take 10 mg of Ditropan. LOV states 5mg  Ditropan at bedtime. Patient is made aware I will confirmed with Sarah on the dose of Ditropan. Patient voiced understanding.

## 2023-12-25 NOTE — Telephone Encounter (Signed)
[facility]    Patient needs a refill  on DITROPAN) 5 MG tablet  patient said the Dr Annabell Howells told him to take 10 mg so he took double of the 5mg  and now he is out

## 2023-12-26 ENCOUNTER — Ambulatory Visit
Admission: EM | Admit: 2023-12-26 | Discharge: 2023-12-26 | Disposition: A | Discharge status: Home / Self Care | Payer: PPO | Attending: Family Medicine | Admitting: Family Medicine

## 2023-12-26 DIAGNOSIS — J22 Unspecified acute lower respiratory infection: Secondary | ICD-10-CM

## 2023-12-26 MED ORDER — PROMETHAZINE-DM 6.25-15 MG/5ML PO SYRP: 5.0000 mL | ORAL_SOLUTION | Freq: Four times a day (QID) | ORAL | 0 refills | Status: DC | PRN

## 2023-12-26 MED ORDER — AZITHROMYCIN 250 MG PO TABS: ORAL_TABLET | ORAL | 0 refills | Status: AC

## 2023-12-26 NOTE — ED Triage Notes (Signed)
Pt reports he has developed a cough after surgery x 1 week ago.

## 2023-12-28 ENCOUNTER — Encounter (HOSPITAL_COMMUNITY): Payer: PPO | Admitting: Occupational Therapy

## 2023-12-28 DIAGNOSIS — T560X1A Toxic effect of lead and its compounds, accidental (unintentional), initial encounter: Secondary | ICD-10-CM | POA: Diagnosis not present

## 2023-12-28 DIAGNOSIS — M19012 Primary osteoarthritis, left shoulder: Secondary | ICD-10-CM | POA: Diagnosis not present

## 2023-12-28 NOTE — Telephone Encounter (Signed)
Tried calling patient and unable to leave vm due to mailbox full.

## 2023-12-30 NOTE — ED Provider Notes (Signed)
[facility]    CSN: [id number] Arrival date & time: 12/26/23  1500      History   Chief Complaint No chief complaint on file.   HPI Frank [name] is a 73 y.o. male.   Patient presenting today with 1 week history of hacking cough cough.  Denies chest pain, shortness of breath, abdominal pain, nausea vomiting or diarrhea.  Is status post surgical procedure about a week ago and cough started very shortly after.  Patient and wife concerned about pneumonia.  States he has been using his incentive spirometer and supportive over-the-counter medications with minimal relief.  No known history of chronic pulmonary disease diagnosed.    Past Medical History:  Diagnosis Date   Arthritis    Barrett's esophagus    BPH (benign prostatic hyperplasia)    Chronic kidney disease    Fracture of 5th metatarsal    GERD (gastroesophageal reflux disease)    Hypercholesteremia    Hypertension    Mastoiditis    Pre-diabetes    Ulcer disease    Bulbar    Patient Active Problem List   Diagnosis Date Noted   Actinic keratosis 11/06/2023   Central perforation of tympanic membrane of left ear 11/06/2023   Dysfunction of both eustachian tubes 11/06/2023   Kidney stones 11/06/2023   Diverticulitis of large intestine with abscess 06/20/2023   Sinus bradycardia 06/20/2023   Hypokalemia 06/20/2023   Rash 06/20/2023   SBO (small bowel obstruction) (HCC) 06/20/2023   Perforated abdominal viscus 06/20/2023   Diverticulitis large intestine w/o perforation or abscess w/o bleeding 06/20/2023   Spondylolisthesis of lumbar region 06/11/2023   Arthropathy of lumbar facet joint 04/02/2023   History of bilateral mastoidectomy 09/19/2021   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/19/2021   Mastoid disorder, left 09/16/2019   Left foot pain 02/14/2014   BPH (benign prostatic hyperplasia)    Barrett's esophagus    Ulcer disease    GERD (gastroesophageal reflux disease)  05/24/2012   HTN (hypertension) 05/24/2012   History of colonic polyps 05/24/2012    Past Surgical History:  Procedure Laterality Date   BACK SURGERY     BIOPSY  12/12/2021   Procedure: BIOPSY;  Surgeon: Malissa Hippo, MD;  [facility];  Service: Endoscopy;;   COLONOSCOPY     COLONOSCOPY     COLONOSCOPY N/A 08/27/2016   Procedure: COLONOSCOPY;  Surgeon: Malissa Hippo, MD;  [facility]: [facility];  Service: Endoscopy;  Laterality: N/A;  930 - moved to 9/27 @ 12:00 - Ann notified pt   COLONOSCOPY N/A 12/12/2021   Procedure: COLONOSCOPY;  Surgeon: Malissa Hippo, MD;  [facility];  Service: Endoscopy;  Laterality: N/A;  730   HEMORRHOID SURGERY  12/02/2007   INGUINAL HERNIA REPAIR Right 10/15/2020   Procedure: OPEN RIGHT INGUINAL HERNIA REPAIR WITH MESH;  Surgeon: Ovidio Kin, MD;  Location: [facility] OR;  Service: General;  Laterality: Right;   INSERTION OF MESH Right 10/15/2020   Procedure: INSERTION OF MESH;  Surgeon: Ovidio Kin, MD;  Location: [facility] OR;  Service: General;  Laterality: Right;   KNEE ARTHROSCOPY     Right Knee   LAPAROTOMY N/A 06/20/2023   Procedure: EXPLORATORY LAPAROTOMY DRAIN ABDOMINAL ABSCESS;  Surgeon: [name], MD;  Location: [facility];  Service: General;  Laterality: N/A;   ORIF TOE FRACTURE Left 12/09/2013   Procedure: LEFT OPEN REDUCTION INTERNAL FIXATION (ORIF) FIFTH METATARSAL FRACTURE;  Surgeon: Nilda Simmer, MD;  Location: [facility];  Service: Orthopedics;  Laterality: Left;   POLYPECTOMY  08/27/2016   Procedure: POLYPECTOMY;  Surgeon: Malissa Hippo, MD;  [facility];  Service: Endoscopy;;  colon   POLYPECTOMY  12/12/2021   Procedure: POLYPECTOMY;  Surgeon: Malissa Hippo, MD;  [facility];  Service: Endoscopy;;   rotor cuff  12/02/2007   right-   TONSILLECTOMY     TOTAL SHOULDER REPLACEMENT     UPPER GASTROINTESTINAL ENDOSCOPY         Home Medications    Prior to  Admission medications   Medication Sig Start Date End Date Taking? Authorizing Provider  azithromycin (ZITHROMAX) 250 MG tablet Take first 2 tablets together, then 1 every day until finished. 12/26/23  Yes Particia Nearing, PA-C  promethazine-dextromethorphan (PROMETHAZINE-DM) 6.25-15 MG/5ML syrup Take 5 mLs by mouth 4 (four) times daily as needed. 12/26/23  Yes Particia Nearing, PA-C  amLODipine (NORVASC) 5 MG tablet Take 5 mg by mouth daily.    [provider]  aspirin EC 81 MG tablet Take 1 tablet (81 mg total) by mouth daily. Swallow whole. 12/13/21   Rehman, Joline Maxcy, MD  chlorthalidone (HYGROTON) 25 MG tablet Take 25 mg by mouth daily. 02/23/19   [provider]  cyclobenzaprine (FLEXERIL) 5 MG tablet Take 1 tablet (5 mg total) by mouth 3 (three) times daily as needed for muscle spasms. 06/12/23   Tressie Stalker, MD  docusate sodium (COLACE) 100 MG capsule Take 1 capsule (100 mg total) by mouth 2 (two) times daily. 06/12/23   Tressie Stalker, MD  ezetimibe (ZETIA) 10 MG tablet Take 10 mg by mouth at bedtime. 08/04/20   [provider]  Ginger, Zingiber officinalis, (GINGER PO) Take 1 capsule by mouth daily.    [provider]  Glucosamine HCl 1000 MG TABS Take 2,000 mg by mouth daily.    [provider]  losartan (COZAAR) 100 MG tablet Take 100 mg by mouth daily.    [provider]  methylPREDNISolone (MEDROL DOSEPAK) 4 MG TBPK tablet Use as directed on package. 07/01/23   Lucretia Roers, MD  Misc Natural Products (TART CHERRY ADVANCED PO) Take 1 tablet by mouth daily.    [provider]  Multiple Vitamin (MULTIVITAMIN WITH MINERALS) TABS tablet Take 1 tablet by mouth daily.    [provider]  Omega-3 Fatty Acids (FISH OIL) 1000 MG CAPS Take 1,000 mg by mouth daily.    [provider]  omeprazole (PRILOSEC) 20 MG capsule Take 20 mg by mouth daily.  04/04/19   [provider]  oxybutynin  (DITROPAN) 5 MG tablet Take 2 tablets (10 mg total) by mouth at bedtime for 7 days. 12/25/23 01/01/24  Donnita Falls, FNP  oxyCODONE (ROXICODONE) 5 MG immediate release tablet Take 1 tablet (5 mg total) by mouth every 4 (four) hours as needed. 06/24/23 06/23/24  [name], MD  pioglitazone (ACTOS) 15 MG tablet Take 15 mg by mouth daily.    [provider]  triamcinolone cream (KENALOG) 0.1 % Apply 1 Application topically 2 (two) times daily as needed. 07/16/23   Particia Nearing, PA-C  TURMERIC PO Take 1 capsule by mouth daily.    [provider]    Family History Family History  Problem Relation Age of Onset   COPD Mother    Heart disease Father    Healthy Sister    Lung cancer Brother    Heart disease Brother  Heart disease Sister    Prostate cancer Brother    Healthy Son    Healthy Son     Social History Social History   Tobacco Use   Smoking status: Never   Smokeless tobacco: Never  Vaping Use   Vaping status: Never Used  Substance Use Topics   Alcohol use: No   Drug use: No     Allergies   Metformin and Amoxicillin   Review of Systems Review of Systems Per HPI  Physical Exam Triage Vital Signs ED Triage Vitals  Encounter Vitals Group     BP 12/26/23 1540 114/71     Systolic BP Percentile --      Diastolic BP Percentile --      Pulse Rate 12/26/23 1540 73     Resp 12/26/23 1540 18     Temp 12/26/23 1540 98.2 F (36.8 C)     Temp Source 12/26/23 1540 Oral     SpO2 12/26/23 1540 93 %     Weight --      Height --      Head Circumference --      Peak Flow --      Pain Score 12/26/23 1541 0     Pain Loc --      Pain Education --      Exclude from Growth Chart --    No data found.  Updated Vital Signs BP 114/71 (BP Location: Right Arm)   Pulse 73   Temp 98.2 F (36.8 C) (Oral)   Resp 18   SpO2 93%   Visual Acuity Right Eye Distance:   Left Eye Distance:   Bilateral Distance:    Right Eye Near:   Left Eye Near:     Bilateral Near:     Physical Exam Vitals and nursing note reviewed.  Constitutional:      Appearance: He is well-developed.  HENT:     Head: Atraumatic.     Right Ear: External ear normal.     Left Ear: External ear normal.     Nose: Congestion present.     Mouth/Throat:     Pharynx: Posterior oropharyngeal erythema present. No oropharyngeal exudate.  Eyes:     Conjunctiva/sclera: Conjunctivae normal.     Pupils: Pupils are equal, round, and reactive to light.  Cardiovascular:     Rate and Rhythm: Normal rate and regular rhythm.  Pulmonary:     Effort: Pulmonary effort is normal. No respiratory distress.     Breath sounds: Rales present.  Musculoskeletal:        General: Normal range of motion.     Cervical back: Normal range of motion and neck supple.  Lymphadenopathy:     Cervical: No cervical adenopathy.  Skin:    General: Skin is warm and dry.  Neurological:     Mental Status: He is alert and oriented to person, place, and time.  Psychiatric:        Behavior: Behavior normal.      UC Treatments / Results  Labs (all labs ordered are listed, but only abnormal results are displayed) Labs Reviewed - No data to display  EKG   Radiology No results found.  Procedures Procedures (including critical care time)  Medications Ordered in UC Medications - No data to display  Initial Impression / Assessment and Plan / UC Course  I have reviewed the triage vital signs and the nursing notes.  Pertinent labs & imaging results that were available during my care of  the patient were reviewed by me and considered in my medical decision making (see chart for details).     Overall well-appearing today and in no acute distress.  Given duration, worsening course and recent surgical procedure which poses risk factors will treat for lower respiratory infection with Zithromax, Phenergan DM, supportive over-the-counter medications and home care.  Return for worsening symptoms.   Continue incentive spirometer.  Final Clinical Impressions(s) / UC Diagnoses   Final diagnoses:  Lower respiratory infection   Discharge Instructions   None    ED Prescriptions     Medication Sig Dispense Auth. Provider   azithromycin (ZITHROMAX) 250 MG tablet Take first 2 tablets together, then 1 every day until finished. 6 tablet Particia Nearing, [place]   promethazine-dextromethorphan (PROMETHAZINE-DM) 6.25-15 MG/5ML syrup Take 5 mLs by mouth 4 (four) times daily as needed. 100 mL Particia Nearing, [place]      PDMP not reviewed this encounter.   Particia Nearing, [place] 12/30/23 1851

## 2023-12-31 ENCOUNTER — Other Ambulatory Visit: Payer: PPO | Admitting: Urology

## 2024-01-05 ENCOUNTER — Ambulatory Visit (HOSPITAL_COMMUNITY): Payer: PPO | Attending: Orthopedic Surgery | Admitting: Occupational Therapy

## 2024-01-05 ENCOUNTER — Encounter (HOSPITAL_COMMUNITY): Payer: Self-pay | Admitting: Occupational Therapy

## 2024-01-05 ENCOUNTER — Other Ambulatory Visit: Payer: Self-pay

## 2024-01-05 DIAGNOSIS — R29898 Other symptoms and signs involving the musculoskeletal system: Secondary | ICD-10-CM | POA: Diagnosis not present

## 2024-01-05 DIAGNOSIS — M25612 Stiffness of left shoulder, not elsewhere classified: Secondary | ICD-10-CM | POA: Diagnosis not present

## 2024-01-05 DIAGNOSIS — M25512 Pain in left shoulder: Secondary | ICD-10-CM | POA: Diagnosis not present

## 2024-01-05 NOTE — Therapy (Signed)
 OUTPATIENT OCCUPATIONAL THERAPY ORTHO EVALUATION  Patient Name: Frank [name] MRN: [id number] DOB:01/12/1951, 73 y.o., male Today's Date: 01/07/2024   END OF SESSION:  OT End of Session - 01/07/24 1048     Visit Number 1    Number of Visits 16    Date for OT Re-Evaluation 03/04/24    Authorization Type [employer], $15 copay    Authorization Time Period no auth no limit    Progress Note Due on Visit 10    OT Start Time 1112    OT Stop Time 1146    OT Time Calculation (min) 34 min    Activity Tolerance Patient tolerated treatment well    Behavior During Therapy WFL for tasks assessed/performed             Past Medical History:  Diagnosis Date   Arthritis    Barrett's esophagus    BPH (benign prostatic hyperplasia)    Chronic kidney disease    Fracture of 5th metatarsal    GERD (gastroesophageal reflux disease)    Hypercholesteremia    Hypertension    Mastoiditis    Pre-diabetes    Ulcer disease    Bulbar   Past Surgical History:  Procedure Laterality Date   BACK SURGERY     BIOPSY  12/12/2021   Procedure: BIOPSY;  Surgeon: Golda Claudis PENNER, MD;  [facility];  Service: Endoscopy;;   COLONOSCOPY     COLONOSCOPY     COLONOSCOPY N/A 08/27/2016   Procedure: COLONOSCOPY;  Surgeon: Claudis PENNER Golda, MD;  [facility];  Service: Endoscopy;  Laterality: N/A;  930 - moved to 9/27 @ 12:00 - Ann notified pt   COLONOSCOPY N/A 12/12/2021   Procedure: COLONOSCOPY;  Surgeon: Golda Claudis PENNER, MD;  [facility];  Service: Endoscopy;  Laterality: N/A;  730   HEMORRHOID SURGERY  12/02/2007   INGUINAL HERNIA REPAIR Right 10/15/2020   Procedure: OPEN RIGHT INGUINAL HERNIA REPAIR WITH MESH;  Surgeon: Ethyl Lenis, MD;  Location: [facility] OR;  Service: General;  Laterality: Right;   INSERTION OF MESH Right 10/15/2020   Procedure: INSERTION OF MESH;  Surgeon: Ethyl Lenis, MD;  Location: [facility] OR;  Service: General;  Laterality: Right;   KNEE  ARTHROSCOPY     Right Knee   LAPAROTOMY N/A 06/20/2023   Procedure: EXPLORATORY LAPAROTOMY DRAIN ABDOMINAL ABSCESS;  Surgeon: Mavis Anes, MD;  Location: [facility];  Service: General;  Laterality: N/A;   ORIF TOE FRACTURE Left 12/09/2013   Procedure: LEFT OPEN REDUCTION INTERNAL FIXATION (ORIF) FIFTH METATARSAL FRACTURE;  Surgeon: Lamar DELENA Millman, MD;  Location: [facility];  Service: Orthopedics;  Laterality: Left;   POLYPECTOMY  08/27/2016   Procedure: POLYPECTOMY;  Surgeon: Claudis PENNER Golda, MD;  [facility];  Service: Endoscopy;;  colon   POLYPECTOMY  12/12/2021   Procedure: POLYPECTOMY;  Surgeon: Golda Claudis PENNER, MD;  [facility];  Service: Endoscopy;;   rotor cuff  12/02/2007   right-   TONSILLECTOMY     TOTAL SHOULDER REPLACEMENT     UPPER GASTROINTESTINAL ENDOSCOPY     Patient Active Problem List   Diagnosis Date Noted   Actinic keratosis 11/06/2023   Central perforation of tympanic membrane of left ear 11/06/2023   Dysfunction of both eustachian tubes 11/06/2023   Kidney stones 11/06/2023   Diverticulitis of large intestine with abscess 06/20/2023   Sinus bradycardia 06/20/2023   Hypokalemia 06/20/2023   Rash 06/20/2023   SBO (  small bowel obstruction) (HCC) 06/20/2023   Perforated abdominal viscus 06/20/2023   Diverticulitis large intestine w/o perforation or abscess w/o bleeding 06/20/2023   Spondylolisthesis of lumbar region 06/11/2023   Arthropathy of lumbar facet joint 04/02/2023   History of bilateral mastoidectomy 09/19/2021   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/19/2021   Mastoid disorder, left 09/16/2019   Left foot pain 02/14/2014   BPH (benign prostatic hyperplasia)    Barrett's esophagus    Ulcer disease    GERD (gastroesophageal reflux disease) 05/24/2012   HTN (hypertension) 05/24/2012   History of colonic polyps 05/24/2012    PCP: Sheryle Carwin, MD REFERRING PROVIDER: Josefina Chew,  MD  ONSET DATE: 12/16/23  REFERRING DIAG: Left Total Shoulder Replacement  THERAPY DIAG:  Acute pain of left shoulder  Stiffness of left shoulder, not elsewhere classified  Other symptoms and signs involving the musculoskeletal system  Rationale for Evaluation and Treatment: Rehabilitation  SUBJECTIVE:   SUBJECTIVE STATEMENT: I feel pretty good so far Pt accompanied by: self  PERTINENT HISTORY: Pt underwent L TSA on 12/16/23. PMH from chart listed above.  PRECAUTIONS: Shoulder  WEIGHT BEARING RESTRICTIONS: Yes >10lbs  PAIN:  Are you having pain? No  FALLS: Has patient fallen in last 6 months? Yes. Number of falls 2 falls  PLOF: Independent  PATIENT GOALS: To get the shoulder back moving  NEXT MD VISIT: 02/01/24  OBJECTIVE:   HAND DOMINANCE: Right  ADLs: Overall ADLs: Pt unable to lift or mobilize his LUE, limiting his ability to complete ADL's independently or without max compensatory strategies. Pt also is unable to lift or carry objects during cooking and cleaning tasks.   FUNCTIONAL OUTCOME MEASURES: Upper Extremity Functional Scale (UEFS): 52/80 - 65%  UPPER EXTREMITY ROM:       Assessed in supine, er/IR adducted  Passive ROM Left eval  Shoulder flexion 109  Shoulder abduction 110  Shoulder internal rotation 90  Shoulder external rotation 37  (Blank rows = not tested)    UPPER EXTREMITY MMT:     Assessed in seated, er/IR adducted 01/05/24: Unable to assess per protocol  MMT Left eval  Shoulder flexion   Shoulder abduction   Shoulder internal rotation   Shoulder external rotation   (Blank rows = not tested)  SENSATION: Numbness/tingling in the wrist and hand  EDEMA: no swelling noted  OBSERVATIONS: moderate fascial restrictions noted around the biceps, trapezius, and scapular region.   TODAY'S TREATMENT:                                                                                                                              DATE:    01/05/24 -Evaluation -Measurements -Wrist AROM: flexion, extension, ulnar/radial deviation, supination/pronation, x10 -Elbow ROM: flexion, extension, x10 -Pendulums: 2x30    PATIENT EDUCATION: Education details: Wrist and Elbow ROM, Pendulums Person educated: Patient Education method: Explanation, Demonstration, and Handouts Education comprehension: verbalized understanding and returned demonstration  HOME EXERCISE  PROGRAM: 2/4: Wrist and Elbow ROM, Pendulums  GOALS: Goals reviewed with patient? Yes   SHORT TERM GOALS: Target date: 02/04/24  Pt will be provided with and educated on HEP to improve mobility in LUE required for use during ADL completion.   Goal status: INITIAL  2.  Pt will increase LUE P/ROM by 30 degrees to improve ability to use LUE during dressing tasks with minimal compensatory techniques.   Goal status: INITIAL  3.  Pt will increase LUE strength to 3+/5 to improve ability to reach for items at waist to chest height during bathing and grooming tasks.   Goal status: INITIAL  LONG TERM GOALS: Target date: 03/04/24  Pt will decrease pain in LUE to 3/10 or less to improve ability to sleep for 2+ consecutive hours without waking due to pain.   Goal status: INITIAL  2.  Pt will decrease LUE fascial restrictions to min amounts or less to improve mobility required for functional reaching tasks.   Goal status: INITIAL  3.  Pt will increase LUE A/ROM by 50 degrees to improve ability to use LUE when reaching overhead or behind back during dressing and bathing tasks.   Goal status: INITIAL  4.  Pt will increase LUE strength to 4+/5 or greater to improve ability to use LUE when lifting or carrying items during meal preparation/housework/yardwork tasks.   Goal status: INITIAL  5.  Pt will return to highest level of function using LUE as non-dominant during functional task completion.   Goal status: INITIAL   ASSESSMENT:  CLINICAL IMPRESSION: Patient is  a 73 y.o. male who was seen today for occupational therapy evaluation for L Total Shoulder Replacement. Pt presents with increased pain and fascial restrictions, decreased ROM, strength, and functional use of the LUE.   PERFORMANCE DEFICITS: in functional skills including in functional skills including ADLs, IADLs, coordination, tone, ROM, strength, pain, fascial restrictions, muscle spasms, and UE functional use.  IMPAIRMENTS: are limiting patient from ADLs, IADLs, rest and sleep, work, leisure, and social participation.   COMORBIDITIES: has no other co-morbidities that affects occupational performance. Patient will benefit from skilled OT to address above impairments and improve overall function.  MODIFICATION OR ASSISTANCE TO COMPLETE EVALUATION: No modification of tasks or assist necessary to complete an evaluation.  OT OCCUPATIONAL PROFILE AND HISTORY: Problem focused assessment: Including review of records relating to presenting problem.  CLINICAL DECISION MAKING: LOW - limited treatment options, no task modification necessary  REHAB POTENTIAL: Good  EVALUATION COMPLEXITY: Low      PLAN:  OT FREQUENCY: 2x/week  OT DURATION: 8 weeks  PLANNED INTERVENTIONS: 97168 OT Re-evaluation, 97535 self care/ADL training, 02889 therapeutic exercise, 97530 therapeutic activity, 97112 neuromuscular re-education, 97140 manual therapy, 97035 ultrasound, 97010 moist heat, 97032 electrical stimulation (manual), passive range of motion, functional mobility training, energy conservation, coping strategies training, patient/family education, and DME and/or AE instructions  RECOMMENDED OTHER SERVICES: N/A  CONSULTED AND AGREED WITH PLAN OF CARE: Patient  PLAN FOR NEXT SESSION: Manual Therapy, P/ROM, Table Slides, Low Level Wall Washes   Charrisse Masley Thelbert, OTR/L [facility] [phone] Valentin Jillyn Thelbert, OT 01/07/2024, 10:50 AM

## 2024-01-05 NOTE — Patient Instructions (Addendum)
AROM Exercises   1) Wrist Flexion  Start with wrist at edge of table, palm facing up. With wrist hanging slightly off table, curl wrist upward, and back down.      2) Wrist Extension  Start with wrist at edge of table, palm facing down. With wrist slightly off the edge of the table, curl wrist up and back down.      3) Radial Deviations  Start with forearm flat against a table, wrist hanging slightly off the edge, and palm facing the wall. Bending at the wrist only, and keeping palm facing the wall, bend wrist so fist is pointing towards the floor, back up to start position, and up towards the ceiling. Return to start.        4) WRIST PRONATION  Turn your forearm towards palm face down.  Keep your elbow bent and by the side of your  Body.      5) WRIST SUPINATION  Turn your forearm towards palm face up.  Keep your elbow bent and by the side of your  Body.      *Complete exercises 10-15 times each, 2-3 times per day*  

## 2024-01-06 ENCOUNTER — Other Ambulatory Visit (HOSPITAL_COMMUNITY): Payer: Self-pay | Admitting: Internal Medicine

## 2024-01-06 DIAGNOSIS — R059 Cough, unspecified: Secondary | ICD-10-CM

## 2024-01-07 ENCOUNTER — Ambulatory Visit (HOSPITAL_COMMUNITY)
Admission: RE | Admit: 2024-01-07 | Discharge: 2024-01-07 | Disposition: A | Discharge status: Home / Self Care | Payer: PPO | Source: Ambulatory Visit | Attending: Internal Medicine | Admitting: Internal Medicine

## 2024-01-07 DIAGNOSIS — R059 Cough, unspecified: Secondary | ICD-10-CM | POA: Insufficient documentation

## 2024-01-07 DIAGNOSIS — R9389 Abnormal findings on diagnostic imaging of other specified body structures: Secondary | ICD-10-CM | POA: Diagnosis not present

## 2024-01-08 ENCOUNTER — Encounter (HOSPITAL_COMMUNITY): Payer: PPO | Admitting: Occupational Therapy

## 2024-01-11 ENCOUNTER — Encounter (HOSPITAL_COMMUNITY): Payer: PPO | Admitting: Occupational Therapy

## 2024-01-14 ENCOUNTER — Ambulatory Visit: Payer: PPO | Admitting: Urology

## 2024-01-14 ENCOUNTER — Encounter: Payer: Self-pay | Admitting: Urology

## 2024-01-14 VITALS — BP 101/68 | HR 99

## 2024-01-14 DIAGNOSIS — N200 Calculus of kidney: Secondary | ICD-10-CM

## 2024-01-14 DIAGNOSIS — R351 Nocturia: Secondary | ICD-10-CM

## 2024-01-14 DIAGNOSIS — N401 Enlarged prostate with lower urinary tract symptoms: Secondary | ICD-10-CM

## 2024-01-14 DIAGNOSIS — R3129 Other microscopic hematuria: Secondary | ICD-10-CM

## 2024-01-14 DIAGNOSIS — R350 Frequency of micturition: Secondary | ICD-10-CM

## 2024-01-14 MED ORDER — GEMTESA 75 MG PO TABS: 1.0000 | ORAL_TABLET | Freq: Every day | ORAL | 0 refills | Status: DC

## 2024-01-14 MED ORDER — CIPROFLOXACIN HCL 500 MG PO TABS
500.0000 mg | ORAL_TABLET | Freq: Once | ORAL | Status: DC
Start: 2024-01-14 — End: 2024-01-14

## 2024-01-14 NOTE — Progress Notes (Unsigned)
Subjective:  1. Benign prostatic hyperplasia with urinary frequency   2. Nocturia   3. Microscopic hematuria   4. Kidney stones     01/14/24: Frank [name] returns today in f/u.  He had seen Maralyn Sago in December and was put on oxybutynin 5mg  at bedtime for nocturia.  It helped initially but then stop  He is getting up 1-3x. He has some daytime frequency.  He has no urgency.  HE has a good stream.  He feels like he is emptying.  HE has had no hematuria.  He had a CT in 7/24 that showed small non-obstructive renal stones.  There was mild prostate enlargement.  He has a history of UTI's but no current symptoms.  He is off of flomax.  He has 1 Diet DP during the day.        ROS:  ROS:  A complete review of systems was performed.  All systems are negative except for pertinent findings as noted.   Review of Systems  Musculoskeletal:  Positive for joint pain.  All other systems reviewed and are negative.   Allergies  Allergen Reactions   Metformin    Amoxicillin Rash    Outpatient Encounter Medications as of 01/14/2024  Medication Sig   amLODipine (NORVASC) 5 MG tablet Take 5 mg by mouth daily.   aspirin EC 81 MG tablet Take 1 tablet (81 mg total) by mouth daily. Swallow whole.   azithromycin (ZITHROMAX) 250 MG tablet Take first 2 tablets together, then 1 every day until finished.   chlorthalidone (HYGROTON) 25 MG tablet Take 25 mg by mouth daily.   cyclobenzaprine (FLEXERIL) 5 MG tablet Take 1 tablet (5 mg total) by mouth 3 (three) times daily as needed for muscle spasms.   docusate sodium (COLACE) 100 MG capsule Take 1 capsule (100 mg total) by mouth 2 (two) times daily.   ezetimibe (ZETIA) 10 MG tablet Take 10 mg by mouth at bedtime.   Ginger, Zingiber officinalis, (GINGER PO) Take 1 capsule by mouth daily.   Glucosamine HCl 1000 MG TABS Take 2,000 mg by mouth daily.   losartan (COZAAR) 100 MG tablet Take 100 mg by mouth daily.   methylPREDNISolone (MEDROL DOSEPAK) 4 MG TBPK tablet Use  as directed on package.   Misc Natural Products (TART CHERRY ADVANCED PO) Take 1 tablet by mouth daily.   Multiple Vitamin (MULTIVITAMIN WITH MINERALS) TABS tablet Take 1 tablet by mouth daily.   Omega-3 Fatty Acids (FISH OIL) 1000 MG CAPS Take 1,000 mg by mouth daily.   omeprazole (PRILOSEC) 20 MG capsule Take 20 mg by mouth daily.    oxyCODONE (ROXICODONE) 5 MG immediate release tablet Take 1 tablet (5 mg total) by mouth every 4 (four) hours as needed.   pioglitazone (ACTOS) 15 MG tablet Take 15 mg by mouth daily.   promethazine-dextromethorphan (PROMETHAZINE-DM) 6.25-15 MG/5ML syrup Take 5 mLs by mouth 4 (four) times daily as needed.   triamcinolone cream (KENALOG) 0.1 % Apply 1 Application topically 2 (two) times daily as needed.   TURMERIC PO Take 1 capsule by mouth daily.   Vibegron (GEMTESA) 75 MG TABS Take 1 tablet (75 mg total) by mouth daily.   [DISCONTINUED] ciprofloxacin (CIPRO) tablet 500 mg    No facility-administered encounter medications on file as of 01/14/2024.    Past Medical History:  Diagnosis Date   Arthritis    Barrett's esophagus    BPH (benign prostatic hyperplasia)    Chronic kidney disease    Fracture of 5th  metatarsal    GERD (gastroesophageal reflux disease)    Hypercholesteremia    Hypertension    Mastoiditis    Pre-diabetes    Ulcer disease    Bulbar    Past Surgical History:  Procedure Laterality Date   BACK SURGERY     BIOPSY  12/12/2021   Procedure: BIOPSY;  Surgeon: Malissa Hippo, MD;  [facility];  Service: Endoscopy;;   COLONOSCOPY     COLONOSCOPY     COLONOSCOPY N/A 08/27/2016   Procedure: COLONOSCOPY;  Surgeon: Malissa Hippo, MD;  [facility];  Service: Endoscopy;  Laterality: N/A;  930 - moved to 9/27 @ 12:00 - Ann notified pt   COLONOSCOPY N/A 12/12/2021   Procedure: COLONOSCOPY;  Surgeon: Malissa Hippo, MD;  [facility];  Service: Endoscopy;  Laterality: N/A;  730   HEMORRHOID SURGERY   12/02/2007   INGUINAL HERNIA REPAIR Right 10/15/2020   Procedure: OPEN RIGHT INGUINAL HERNIA REPAIR WITH MESH;  Surgeon: Ovidio Kin, MD;  Location: [facility] OR;  Service: General;  Laterality: Right;   INSERTION OF MESH Right 10/15/2020   Procedure: INSERTION OF MESH;  Surgeon: Ovidio Kin, MD;  Location: [facility] OR;  Service: General;  Laterality: Right;   KNEE ARTHROSCOPY     Right Knee   LAPAROTOMY N/A 06/20/2023   Procedure: EXPLORATORY LAPAROTOMY DRAIN ABDOMINAL ABSCESS;  Surgeon: [name], MD;  Location: [facility];  Service: General;  Laterality: N/A;   ORIF TOE FRACTURE Left 12/09/2013   Procedure: LEFT OPEN REDUCTION INTERNAL FIXATION (ORIF) FIFTH METATARSAL FRACTURE;  Surgeon: Nilda Simmer, MD;  Location: [facility];  Service: Orthopedics;  Laterality: Left;   POLYPECTOMY  08/27/2016   Procedure: POLYPECTOMY;  Surgeon: Malissa Hippo, MD;  [facility];  Service: Endoscopy;;  colon   POLYPECTOMY  12/12/2021   Procedure: POLYPECTOMY;  Surgeon: Malissa Hippo, MD;  [facility];  Service: Endoscopy;;   rotor cuff  12/02/2007   right-   TONSILLECTOMY     TOTAL SHOULDER REPLACEMENT     UPPER GASTROINTESTINAL ENDOSCOPY      Social History   Socioeconomic History   Marital status: Married    Spouse name: Not on file   Number of children: Not on file   Years of education: Not on file   Highest education level: Not on file  Occupational History   Not on file  Tobacco Use   Smoking status: Never   Smokeless tobacco: Never  Vaping Use   Vaping status: Never Used  Substance and Sexual Activity   Alcohol use: No   Drug use: No   Sexual activity: Not on file  Other Topics Concern   Not on file  Social History Narrative   Not on file   Social Drivers of Health   Financial Resource Strain: Not on file  Food Insecurity: Low Risk  (11/06/2023)   Received from [facility]   Hunger Vital Sign    Worried About Running Out of Food in the  Last Year: Never true    Ran Out of Food in the Last Year: Never true  Transportation Needs: No Transportation Needs (11/06/2023)   Received from [facility]    In the past 12 months, has lack of reliable transportation kept you from medical appointments, meetings, work or from getting things needed for daily living? : No  Physical Activity: Not on file  Stress: Not on file  Social Connections: Not on file  Intimate Partner Violence: Not on file    Family History  Problem Relation Age of Onset   COPD Mother    Heart disease Father    Healthy Sister    Lung cancer Brother    Heart disease Brother    Heart disease Sister    Prostate cancer Brother    Healthy Son    Healthy Son        Objective: Vitals:   01/14/24 1337  BP: 101/68  Pulse: 99     Physical Exam  Lab Results:  PSA No results found for: "PSA" No results found for: "TESTOSTERONE"    Studies/Results: No results found. No results found for this or any previous visit.  No results found for this or any previous visit.  No results found for this or any previous visit.  No results found for this or any previous visit.  Results for orders placed during the hospital encounter of 05/01/20  US RENAL  Narrative CLINICAL DATA:  Initial evaluation for abnormal finding of blood chemistry.  EXAM: RENAL / URINARY TRACT ULTRASOUND COMPLETE  COMPARISON:  Prior CT from 03/15/2010.  FINDINGS: Right Kidney:  Renal measurements: 12.8 x 5.8 x 7.2 cm = volume: 284 mL. Renal echogenicity within normal limits. 2 mm nonobstructive stone present at the upper pole. No hydronephrosis. 0.8 x 0.8 x 1.0 cm simple cyst present at the upper pole. No other focal renal mass.  Left Kidney:  Renal measurements: 12.3 x 5.5 x 5.8 cm = volume: 211 mL. Echogenicity within normal limits. No nephrolithiasis or hydronephrosis. No focal renal mass.  Bladder:  Appears normal for degree of bladder  distention.  Other:  None.  IMPRESSION: 1. 2 mm nonobstructive right renal nephrolithiasis. No hydronephrosis. 2. 1 cm simple cyst at the upper pole the right kidney. 3. Otherwise unremarkable renal ultrasound.   Electronically Signed By: Rise Mu M.D. On: 05/02/2020 00:48  No results found for this or any previous visit.  No results found for this or any previous visit.  No results found for this or any previous visit.  I have reviewed his CT films from 7/24.   Procedure: Cystoscopy.  He was prepped with betadine and 2% lidocaine jelly.   Cipro 500mg  was given.  THe urethra was normal.  The prostate with short with mild bilobar hyperplasia and a few inflammatory polyps.  He had mild trabeculation without mucosal lesions.  UO's were normal.    Assessment & Plan: Microhematuria:  He has tiny renal stones and some inflammatory polyps of the prostatic urethral but no other findings of concern.  BPH with Nocturia and frequency.  I recommended he drink water and cut out the diet DP's.  I have give him Gemtesa samples to try if that doesn't work for him.  Side effects and instructions reviewed.      Meds ordered this encounter  Medications   DISCONTD: ciprofloxacin (CIPRO) tablet 500 mg   Vibegron (GEMTESA) 75 MG TABS    Sig: Take 1 tablet (75 mg total) by mouth daily.    Dispense:  42 tablet    Refill:  0     Orders Placed This Encounter  Procedures   Urinalysis, Routine w reflex microscopic   Cystoscopy      Return in about 4 weeks (around 02/11/2024) for with Sarah for a PVR.Marland Kitchen   CC: Carylon Perches, MD      Frank [name] 01/15/2024

## 2024-01-15 ENCOUNTER — Ambulatory Visit (HOSPITAL_COMMUNITY): Payer: PPO | Admitting: Occupational Therapy

## 2024-01-15 ENCOUNTER — Encounter (HOSPITAL_COMMUNITY): Payer: PPO | Admitting: Occupational Therapy

## 2024-01-15 ENCOUNTER — Encounter (HOSPITAL_COMMUNITY): Payer: Self-pay | Admitting: Occupational Therapy

## 2024-01-15 DIAGNOSIS — R29898 Other symptoms and signs involving the musculoskeletal system: Secondary | ICD-10-CM

## 2024-01-15 DIAGNOSIS — M25512 Pain in left shoulder: Secondary | ICD-10-CM | POA: Diagnosis not present

## 2024-01-15 DIAGNOSIS — M25612 Stiffness of left shoulder, not elsewhere classified: Secondary | ICD-10-CM

## 2024-01-15 NOTE — Therapy (Signed)
OUTPATIENT OCCUPATIONAL THERAPY ORTHO TREATMENT  Patient Name: Frank [name] MRN: [id number] DOB:06/09/1951, 73 y.o., male Today's Date: 01/15/2024   END OF SESSION:  OT End of Session - 01/15/24 1224     Visit Number 2    Number of Visits 16    Date for OT Re-Evaluation 03/04/24    Authorization Type [employer], $15 copay    Authorization Time Period no auth no limit    Progress Note Due on Visit 10    OT Start Time 1148    OT Stop Time 1228    OT Time Calculation (min) 40 min    Activity Tolerance Patient tolerated treatment well    Behavior During Therapy WFL for tasks assessed/performed              Past Medical History:  Diagnosis Date   Arthritis    Barrett's esophagus    BPH (benign prostatic hyperplasia)    Chronic kidney disease    Fracture of 5th metatarsal    GERD (gastroesophageal reflux disease)    Hypercholesteremia    Hypertension    Mastoiditis    Pre-diabetes    Ulcer disease    Bulbar   Past Surgical History:  Procedure Laterality Date   BACK SURGERY     BIOPSY  12/12/2021   Procedure: BIOPSY;  Surgeon: Malissa Hippo, MD;  [facility];  Service: Endoscopy;;   COLONOSCOPY     COLONOSCOPY     COLONOSCOPY N/A 08/27/2016   Procedure: COLONOSCOPY;  Surgeon: Malissa Hippo, MD;  [facility];  Service: Endoscopy;  Laterality: N/A;  930 - moved to 9/27 @ 12:00 - Ann notified pt   COLONOSCOPY N/A 12/12/2021   Procedure: COLONOSCOPY;  Surgeon: Malissa Hippo, MD;  [facility];  Service: Endoscopy;  Laterality: N/A;  730   HEMORRHOID SURGERY  12/02/2007   INGUINAL HERNIA REPAIR Right 10/15/2020   Procedure: OPEN RIGHT INGUINAL HERNIA REPAIR WITH MESH;  Surgeon: Ovidio Kin, MD;  Location: [facility] OR;  Service: General;  Laterality: Right;   INSERTION OF MESH Right 10/15/2020   Procedure: INSERTION OF MESH;  Surgeon: Ovidio Kin, MD;  Location[facility];  Service: General;  Laterality: Right;   KNEE  ARTHROSCOPY     Right Knee   LAPAROTOMY N/A 06/20/2023   Procedure: EXPLORATORY LAPAROTOMY DRAIN ABDOMINAL ABSCESS;  Surgeon: [name], MD;  Location: [facility];  Service: General;  Laterality: N/A;   ORIF TOE FRACTURE Left 12/09/2013   Procedure: LEFT OPEN REDUCTION INTERNAL FIXATION (ORIF) FIFTH METATARSAL FRACTURE;  Surgeon: Nilda Simmer, MD;  Location: [facility];  Service: Orthopedics;  Laterality: Left;   POLYPECTOMY  08/27/2016   Procedure: POLYPECTOMY;  Surgeon: Malissa Hippo, MD;  [facility];  Service: Endoscopy;;  colon   POLYPECTOMY  12/12/2021   Procedure: POLYPECTOMY;  Surgeon: Malissa Hippo, MD;  [facility];  Service: Endoscopy;;   rotor cuff  12/02/2007   right-   TONSILLECTOMY     TOTAL SHOULDER REPLACEMENT     UPPER GASTROINTESTINAL ENDOSCOPY     Patient Active Problem List   Diagnosis Date Noted   Actinic keratosis 11/06/2023   Central perforation of tympanic membrane of left ear 11/06/2023   Dysfunction of both eustachian tubes 11/06/2023   Kidney stones 11/06/2023   Diverticulitis of large intestine with abscess 06/20/2023   Sinus bradycardia 06/20/2023   Hypokalemia 06/20/2023   Rash 06/20/2023  SBO (small bowel obstruction) (HCC) 06/20/2023   Perforated abdominal viscus 06/20/2023   Diverticulitis large intestine w/o perforation or abscess w/o bleeding 06/20/2023   Spondylolisthesis of lumbar region 06/11/2023   Arthropathy of lumbar facet joint 04/02/2023   History of bilateral mastoidectomy 09/19/2021   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/19/2021   Mastoid disorder, left 09/16/2019   Left foot pain 02/14/2014   BPH (benign prostatic hyperplasia)    Barrett's esophagus    Ulcer disease    GERD (gastroesophageal reflux disease) 05/24/2012   HTN (hypertension) 05/24/2012   History of colonic polyps 05/24/2012    PCP: Carylon Perches, MD REFERRING PROVIDER: Teryl Lucy,  MD  ONSET DATE: 12/16/23  REFERRING DIAG: Left Total Shoulder Replacement  THERAPY DIAG:  Acute pain of left shoulder  Stiffness of left shoulder, not elsewhere classified  Other symptoms and signs involving the musculoskeletal system  Rationale for Evaluation and Treatment: Rehabilitation  SUBJECTIVE:   SUBJECTIVE STATEMENT: "I feel pretty good so far" Pt accompanied by: self  PERTINENT HISTORY: Pt underwent L TSA on 12/16/23. PMH from chart listed above.  PRECAUTIONS: Shoulder  WEIGHT BEARING RESTRICTIONS: Yes >10lbs  PAIN:  Are you having pain? No  FALLS: Has patient fallen in last 6 months? Yes. Number of falls 2 falls  PLOF: Independent  PATIENT GOALS: To get the shoulder back moving  NEXT MD VISIT: 02/01/24  OBJECTIVE:   HAND DOMINANCE: Right  ADLs: Overall ADLs: Pt unable to lift or mobilize his LUE, limiting his ability to complete ADL's independently or without max compensatory strategies. Pt also is unable to lift or carry objects during cooking and cleaning tasks.   FUNCTIONAL OUTCOME MEASURES: Upper Extremity Functional Scale (UEFS): 52/80 - 65%  UPPER EXTREMITY ROM:       Assessed in supine, er/IR adducted  Passive ROM Left eval  Shoulder flexion 109  Shoulder abduction 110  Shoulder internal rotation 90  Shoulder external rotation 37  (Blank rows = not tested)    UPPER EXTREMITY MMT:     Assessed in seated, er/IR adducted 01/05/24: Unable to assess per protocol  MMT Left eval  Shoulder flexion   Shoulder abduction   Shoulder internal rotation   Shoulder external rotation   (Blank rows = not tested)  SENSATION: Numbness/tingling in the wrist and hand  EDEMA: no swelling noted  OBSERVATIONS: moderate fascial restrictions noted around the biceps, trapezius, and scapular region.   TODAY'S TREATMENT:                                                                                                                              DATE:    01/16/24 -Manual:manual techniques to pectoralis region, trapezius, deltoid to decrease fascial restrictions and pain, increase ROM  -P/ROM: supine; flexion, abduction, internal/external rotation 10x  -Table slides: seated; flexion, abduction 10 each   01/05/24 -Evaluation -Measurements -Wrist AROM: flexion, extension, ulnar/radial deviation, supination/pronation, x10 -Elbow ROM: flexion,  extension, x10 -Pendulums: 2x30"    PATIENT EDUCATION: Education details: Wrist and Elbow ROM, Pendulums Person educated: Patient Education method: Explanation, Demonstration, and Handouts Education comprehension: verbalized understanding and returned demonstration  HOME EXERCISE PROGRAM: 2/4: Wrist and Elbow ROM, Pendulums 2/14:   GOALS: Goals reviewed with patient? Yes   SHORT TERM GOALS: Target date: 02/04/24  Pt will be provided with and educated on HEP to improve mobility in LUE required for use during ADL completion.   Goal status: INITIAL  2.  Pt will increase LUE P/ROM by 30 degrees to improve ability to use LUE during dressing tasks with minimal compensatory techniques.   Goal status: INITIAL  3.  Pt will increase LUE strength to 3+/5 to improve ability to reach for items at waist to chest height during bathing and grooming tasks.   Goal status: INITIAL  LONG TERM GOALS: Target date: 03/04/24  Pt will decrease pain in LUE to 3/10 or less to improve ability to sleep for 2+ consecutive hours without waking due to pain.   Goal status: INITIAL  2.  Pt will decrease LUE fascial restrictions to min amounts or less to improve mobility required for functional reaching tasks.   Goal status: INITIAL  3.  Pt will increase LUE A/ROM by 50 degrees to improve ability to use LUE when reaching overhead or behind back during dressing and bathing tasks.   Goal status: INITIAL  4.  Pt will increase LUE strength to 4+/5 or greater to improve ability to use LUE when lifting or carrying items  during meal preparation/housework/yardwork tasks.   Goal status: INITIAL  5.  Pt will return to highest level of function using LUE as non-dominant during functional task completion.   Goal status: INITIAL   ASSESSMENT:  CLINICAL IMPRESSION: Pt comes to therapy session in sling. He stated that he was leaving the sling off for the majority of the day and sleeping in the sling at night. He is completing pendulums at home. Initiated manual and P/ROM. Gave table slides to add to HEP.   PERFORMANCE DEFICITS: in functional skills including in functional skills including ADLs, IADLs, coordination, tone, ROM, strength, pain, fascial restrictions, muscle spasms, and UE functional use.  IMPAIRMENTS: are limiting patient from ADLs, IADLs, rest and sleep, work, leisure, and social participation.   COMORBIDITIES: has no other co-morbidities that affects occupational performance. Patient will benefit from skilled OT to address above impairments and improve overall function.  MODIFICATION OR ASSISTANCE TO COMPLETE EVALUATION: No modification of tasks or assist necessary to complete an evaluation.  OT OCCUPATIONAL PROFILE AND HISTORY: Problem focused assessment: Including review of records relating to presenting problem.  CLINICAL DECISION MAKING: LOW - limited treatment options, no task modification necessary  REHAB POTENTIAL: Good  EVALUATION COMPLEXITY: Low      PLAN:  OT FREQUENCY: 2x/week  OT DURATION: 8 weeks  PLANNED INTERVENTIONS: 97168 OT Re-evaluation, 97535 self care/ADL training, 69629 therapeutic exercise, 97530 therapeutic activity, 97112 neuromuscular re-education, 97140 manual therapy, 97035 ultrasound, 97010 moist heat, 97032 electrical stimulation (manual), passive range of motion, functional mobility training, energy conservation, coping strategies training, patient/family education, and DME and/or AE instructions  RECOMMENDED OTHER SERVICES: N/A  CONSULTED AND AGREED  WITH PLAN OF CARE: Patient  PLAN FOR NEXT SESSION: Manual Therapy, P/ROM, Table Slides, Low Level [facility] [phone] Bevelyn Ngo, OTR/L 01/15/2024, 12:26 PM

## 2024-01-15 NOTE — Patient Instructions (Signed)
1) SHOULDER: Flexion On Table   Place hands on towel placed on table, elbows straight. Lean forward with you upper body, pushing towel away from body.  ___ reps per set, ___ sets per day  2) Abduction (Passive)   With arm out to side, resting on towel placed on table with palm DOWN, keeping trunk away from table, lean to the side while pushing towel away from body.  Repeat __10__ times. Do __2__ sessions per day.  Copyright  VHI. All rights reserved.     3) Internal Rotation (Assistive)   Seated with elbow bent at right angle and held against side, slide arm on table surface in an inward arc keeping elbow anchored in place. Repeat __10__ times. Do __2__ sessions per day. Activity: Use this motion to brush crumbs off the table.  Copyright  VHI. All rights reserved.

## 2024-01-18 ENCOUNTER — Ambulatory Visit (HOSPITAL_COMMUNITY): Payer: PPO | Admitting: Occupational Therapy

## 2024-01-19 ENCOUNTER — Encounter (HOSPITAL_COMMUNITY): Payer: Self-pay | Admitting: Occupational Therapy

## 2024-01-19 ENCOUNTER — Ambulatory Visit (HOSPITAL_COMMUNITY): Payer: PPO | Admitting: Occupational Therapy

## 2024-01-19 DIAGNOSIS — M25612 Stiffness of left shoulder, not elsewhere classified: Secondary | ICD-10-CM

## 2024-01-19 DIAGNOSIS — R29898 Other symptoms and signs involving the musculoskeletal system: Secondary | ICD-10-CM

## 2024-01-19 DIAGNOSIS — M25512 Pain in left shoulder: Secondary | ICD-10-CM | POA: Diagnosis not present

## 2024-01-19 NOTE — Therapy (Unsigned)
 OUTPATIENT OCCUPATIONAL THERAPY ORTHO TREATMENT  Patient Name: Frank [name] MRN: [id number] DOB:Apr 05, 1951, 73 y.o., male Today's Date: 01/20/2024   END OF SESSION:  OT End of Session - 01/19/24 1231     Visit Number 3    Number of Visits 16    Date for OT Re-Evaluation 03/04/24    Authorization Type [employer], $15 copay    Authorization Time Period no auth no limit    Progress Note Due on Visit 10    OT Start Time 1153    OT Stop Time 1231    OT Time Calculation (min) 38 min    Activity Tolerance Patient tolerated treatment well    Behavior During Therapy WFL for tasks assessed/performed             Past Medical History:  Diagnosis Date   Arthritis    Barrett's esophagus    BPH (benign prostatic hyperplasia)    Chronic kidney disease    Fracture of 5th metatarsal    GERD (gastroesophageal reflux disease)    Hypercholesteremia    Hypertension    Mastoiditis    Pre-diabetes    Ulcer disease    Bulbar   Past Surgical History:  Procedure Laterality Date   BACK SURGERY     BIOPSY  12/12/2021   Procedure: BIOPSY;  Surgeon: Malissa Hippo, MD;  [facility]: [facility];  Service: Endoscopy;;   COLONOSCOPY     COLONOSCOPY     COLONOSCOPY N/A 08/27/2016   Procedure: COLONOSCOPY;  Surgeon: Malissa Hippo, MD;  [facility]: [facility];  Service: Endoscopy;  Laterality: N/A;  930 - moved to 9/27 @ 12:00 - Ann notified pt   COLONOSCOPY N/A 12/12/2021   Procedure: COLONOSCOPY;  Surgeon: Malissa Hippo, MD;  [facility]: [facility];  Service: Endoscopy;  Laterality: N/A;  730   HEMORRHOID SURGERY  12/02/2007   INGUINAL HERNIA REPAIR Right 10/15/2020   Procedure: OPEN RIGHT INGUINAL HERNIA REPAIR WITH MESH;  Surgeon: Ovidio Kin, MD;  Location: [facility] OR;  Service: General;  Laterality: Right;   INSERTION OF MESH Right 10/15/2020   Procedure: INSERTION OF MESH;  Surgeon: Ovidio Kin, MD;  Location: [facility] OR;  Service: General;  Laterality: Right;   KNEE  ARTHROSCOPY     Right Knee   LAPAROTOMY N/A 06/20/2023   Procedure: EXPLORATORY LAPAROTOMY DRAIN ABDOMINAL ABSCESS;  Surgeon: [name], MD;  Location: [facility];  Service: General;  Laterality: N/A;   ORIF TOE FRACTURE Left 12/09/2013   Procedure: LEFT OPEN REDUCTION INTERNAL FIXATION (ORIF) FIFTH METATARSAL FRACTURE;  Surgeon: Nilda Simmer, MD;  Location: [facility];  Service: Orthopedics;  Laterality: Left;   POLYPECTOMY  08/27/2016   Procedure: POLYPECTOMY;  Surgeon: Malissa Hippo, MD;  [facility];  Service: Endoscopy;;  colon   POLYPECTOMY  12/12/2021   Procedure: POLYPECTOMY;  Surgeon: Malissa Hippo, MD;  [facility]: [facility];  Service: Endoscopy;;   rotor cuff  12/02/2007   right-   TONSILLECTOMY     TOTAL SHOULDER REPLACEMENT     UPPER GASTROINTESTINAL ENDOSCOPY     Patient Active Problem List   Diagnosis Date Noted   Actinic keratosis 11/06/2023   Central perforation of tympanic membrane of left ear 11/06/2023   Dysfunction of both eustachian tubes 11/06/2023   Kidney stones 11/06/2023   Diverticulitis of large intestine with abscess 06/20/2023   Sinus bradycardia 06/20/2023   Hypokalemia 06/20/2023   Rash 06/20/2023   SBO (  small bowel obstruction) (HCC) 06/20/2023   Perforated abdominal viscus 06/20/2023   Diverticulitis large intestine w/o perforation or abscess w/o bleeding 06/20/2023   Spondylolisthesis of lumbar region 06/11/2023   Arthropathy of lumbar facet joint 04/02/2023   History of bilateral mastoidectomy 09/19/2021   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/19/2021   Mastoid disorder, left 09/16/2019   Left foot pain 02/14/2014   BPH (benign prostatic hyperplasia)    Barrett's esophagus    Ulcer disease    GERD (gastroesophageal reflux disease) 05/24/2012   HTN (hypertension) 05/24/2012   History of colonic polyps 05/24/2012    PCP: Carylon Perches, MD REFERRING PROVIDER: Teryl Lucy,  MD  ONSET DATE: 12/16/23  REFERRING DIAG: Left Total Shoulder Replacement  THERAPY DIAG:  Acute pain of left shoulder  Stiffness of left shoulder, not elsewhere classified  Other symptoms and signs involving the musculoskeletal system  Rationale for Evaluation and Treatment: Rehabilitation  SUBJECTIVE:   SUBJECTIVE STATEMENT: "I had a rough day yesterday" Pt accompanied by: self  PERTINENT HISTORY: Pt underwent L TSA on 12/16/23. PMH from chart listed above.  PRECAUTIONS: Shoulder  WEIGHT BEARING RESTRICTIONS: Yes >10lbs  PAIN:  Are you having pain? No  FALLS: Has patient fallen in last 6 months? Yes. Number of falls 2 falls  PLOF: Independent  PATIENT GOALS: To get the shoulder back moving  NEXT MD VISIT: 02/01/24  OBJECTIVE:   HAND DOMINANCE: Right  ADLs: Overall ADLs: Pt unable to lift or mobilize his LUE, limiting his ability to complete ADL's independently or without max compensatory strategies. Pt also is unable to lift or carry objects during cooking and cleaning tasks.   FUNCTIONAL OUTCOME MEASURES: Upper Extremity Functional Scale (UEFS): 52/80 - 65%  UPPER EXTREMITY ROM:       Assessed in supine, er/IR adducted  Passive ROM Left eval  Shoulder flexion 109  Shoulder abduction 110  Shoulder internal rotation 90  Shoulder external rotation 37  (Blank rows = not tested)    UPPER EXTREMITY MMT:     Assessed in seated, er/IR adducted 01/05/24: Unable to assess per protocol  MMT Left eval  Shoulder flexion   Shoulder abduction   Shoulder internal rotation   Shoulder external rotation   (Blank rows = not tested)  SENSATION: Numbness/tingling in the wrist and hand  EDEMA: no swelling noted  OBSERVATIONS: moderate fascial restrictions noted around the biceps, trapezius, and scapular region.   TODAY'S TREATMENT:                                                                                                                              DATE:    01/19/24 -Manual Therapy: myofascial release and trigger point applied to biceps, trapezius, and scapular region in order to reduce fascial restrictions and pain, as well as improve ROM -P/ROM: flexion, abduction, er/IR, x10 -AA/ROM: flexion, abduction, protraction, horizontal abduction, er/IR, x10 -Pulleys: flexion, abduction, x60" -[facility]: flexion, abduction,  x10  01/15/24 -Manual:manual techniques to pectoralis region, trapezius, deltoid to decrease fascial restrictions and pain, increase ROM  -P/ROM: supine; flexion, abduction, internal/external rotation 10x  -Table slides: seated; flexion, abduction 10 each   01/05/24 -Evaluation -Measurements -Wrist AROM: flexion, extension, ulnar/radial deviation, supination/pronation, x10 -Elbow ROM: flexion, extension, x10 -Pendulums: 2x30"    PATIENT EDUCATION: Education details: AA/ROM Person educated: Patient Education method: Programmer, multimedia, Facilities manager, and Handouts Education comprehension: verbalized understanding and returned demonstration  HOME EXERCISE PROGRAM: 2/4: Wrist and Elbow ROM, Pendulums 2/14: AA/ROM  GOALS: Goals reviewed with patient? Yes   SHORT TERM GOALS: Target date: 02/04/24  Pt will be provided with and educated on HEP to improve mobility in LUE required for use during ADL completion.   Goal status: IN PROGRESS  2.  Pt will increase LUE P/ROM by 30 degrees to improve ability to use LUE during dressing tasks with minimal compensatory techniques.   Goal status: IN PROGRESS  3.  Pt will increase LUE strength to 3+/5 to improve ability to reach for items at waist to chest height during bathing and grooming tasks.   Goal status: IN PROGRESS  LONG TERM GOALS: Target date: 03/04/24  Pt will decrease pain in LUE to 3/10 or less to improve ability to sleep for 2+ consecutive hours without waking due to pain.   Goal status: IN PROGRESS  2.  Pt will decrease LUE fascial restrictions to min amounts or less  to improve mobility required for functional reaching tasks.   Goal status: IN PROGRESS  3.  Pt will increase LUE A/ROM by 50 degrees to improve ability to use LUE when reaching overhead or behind back during dressing and bathing tasks.   Goal status: IN PROGRESS  4.  Pt will increase LUE strength to 4+/5 or greater to improve ability to use LUE when lifting or carrying items during meal preparation/housework/yardwork tasks.   Goal status: IN PROGRESS  5.  Pt will return to highest level of function using LUE as non-dominant during functional task completion.   Goal status: IN PROGRESS   ASSESSMENT:  CLINICAL IMPRESSION: This session, pt continues to come to therapy in his sling, however reports that at home he only wears it at night, per MD recommendation. Following his protocol he was able to start AA/ROM to tolerance this session, with good movement. OT added pulleys and ball rolls to continue working on his ROM. Verbal and tactile cuing provided for positioning and technique throughout session.   PERFORMANCE DEFICITS: in functional skills including in functional skills including ADLs, IADLs, coordination, tone, ROM, strength, pain, fascial restrictions, muscle spasms, and UE functional use.   PLAN:  OT FREQUENCY: 2x/week  OT DURATION: 8 weeks  PLANNED INTERVENTIONS: 97168 OT Re-evaluation, 97535 self care/ADL training, 40981 therapeutic exercise, 97530 therapeutic activity, 97112 neuromuscular re-education, 97140 manual therapy, 97035 ultrasound, 97010 moist heat, 97032 electrical stimulation (manual), passive range of motion, functional mobility training, energy conservation, coping strategies training, patient/family education, and DME and/or AE instructions  RECOMMENDED OTHER SERVICES: N/A  CONSULTED AND AGREED WITH PLAN OF CARE: Patient  PLAN FOR NEXT SESSION: Manual Therapy, P/ROM, Table Slides, Low Level Wall Washes, AA/ROM   Trish Mage, OTR/L [facility] [phone] Olia Hinderliter Rosemarie Beath, OT 01/20/2024, 8:31 AM

## 2024-01-21 ENCOUNTER — Ambulatory Visit (HOSPITAL_COMMUNITY): Payer: PPO | Admitting: Occupational Therapy

## 2024-01-21 ENCOUNTER — Encounter (HOSPITAL_COMMUNITY): Payer: Self-pay | Admitting: Occupational Therapy

## 2024-01-21 DIAGNOSIS — M25512 Pain in left shoulder: Secondary | ICD-10-CM | POA: Diagnosis not present

## 2024-01-21 DIAGNOSIS — M25612 Stiffness of left shoulder, not elsewhere classified: Secondary | ICD-10-CM

## 2024-01-21 DIAGNOSIS — R29898 Other symptoms and signs involving the musculoskeletal system: Secondary | ICD-10-CM

## 2024-01-21 NOTE — Therapy (Signed)
 OUTPATIENT OCCUPATIONAL THERAPY ORTHO TREATMENT  Patient Name: Frank [name] MRN: [id number] DOB:[birth year], 73 y.o., male Today's Date: 01/21/2024   END OF SESSION:  OT End of Session - 01/21/24 1231     Visit Number 4    Number of Visits 16    Date for OT Re-Evaluation 03/04/24    Authorization Type [employer], $15 copay    Authorization Time Period no auth no limit    Progress Note Due on Visit 10    OT Start Time 1150    OT Stop Time 1229    OT Time Calculation (min) 39 min    Activity Tolerance Patient tolerated treatment well    Behavior During Therapy WFL for tasks assessed/performed              Past Medical History:  Diagnosis Date   Arthritis    Barrett's esophagus    BPH (benign prostatic hyperplasia)    Chronic kidney disease    Fracture of 5th metatarsal    GERD (gastroesophageal reflux disease)    Hypercholesteremia    Hypertension    Mastoiditis    Pre-diabetes    Ulcer disease    Bulbar   Past Surgical History:  Procedure Laterality Date   BACK SURGERY     BIOPSY  12/12/2021   Procedure: BIOPSY;  Surgeon: Malissa Hippo, MD;  [facility];  Service: Endoscopy;;   COLONOSCOPY     COLONOSCOPY     COLONOSCOPY N/A 08/27/2016   Procedure: COLONOSCOPY;  Surgeon: Malissa Hippo, MD;  [facility];  Service: Endoscopy;  Laterality: N/A;  930 - moved to 9/27 @ 12:00 - Ann notified pt   COLONOSCOPY N/A 12/12/2021   Procedure: COLONOSCOPY;  Surgeon: Malissa Hippo, MD;  [facility];  Service: Endoscopy;  Laterality: N/A;  730   HEMORRHOID SURGERY  12/02/2007   INGUINAL HERNIA REPAIR Right 10/15/2020   Procedure: OPEN RIGHT INGUINAL HERNIA REPAIR WITH MESH;  Surgeon: Ovidio Kin, MD;  Location: [facility] OR;  Service: General;  Laterality: Right;   INSERTION OF MESH Right 10/15/2020   Procedure: INSERTION OF MESH;  Surgeon: Ovidio Kin, MD;  Location: [facility] OR;  Service: General;  Laterality: Right;   KNEE  ARTHROSCOPY     Right Knee   LAPAROTOMY N/A 06/20/2023   Procedure: EXPLORATORY LAPAROTOMY DRAIN ABDOMINAL ABSCESS;  Surgeon: [name], MD;  Location: [facility];  Service: General;  Laterality: N/A;   ORIF TOE FRACTURE Left 12/09/2013   Procedure: LEFT OPEN REDUCTION INTERNAL FIXATION (ORIF) FIFTH METATARSAL FRACTURE;  Surgeon: Nilda Simmer, MD;  Location: [facility];  Service: Orthopedics;  Laterality: Left;   POLYPECTOMY  08/27/2016   Procedure: POLYPECTOMY;  Surgeon: Malissa Hippo, MD;  [facility];  Service: Endoscopy;;  colon   POLYPECTOMY  12/12/2021   Procedure: POLYPECTOMY;  Surgeon: Malissa Hippo, MD;  [facility];  Service: Endoscopy;;   rotor cuff  12/02/2007   right-   TONSILLECTOMY     TOTAL SHOULDER REPLACEMENT     UPPER GASTROINTESTINAL ENDOSCOPY     Patient Active Problem List   Diagnosis Date Noted   Actinic keratosis 11/06/2023   Central perforation of tympanic membrane of left ear 11/06/2023   Dysfunction of both eustachian tubes 11/06/2023   Kidney stones 11/06/2023   Diverticulitis of large intestine with abscess 06/20/2023   Sinus bradycardia 06/20/2023   Hypokalemia 06/20/2023   Rash 06/20/2023  SBO (small bowel obstruction) (HCC) 06/20/2023   Perforated abdominal viscus 06/20/2023   Diverticulitis large intestine w/o perforation or abscess w/o bleeding 06/20/2023   Spondylolisthesis of lumbar region 06/11/2023   Arthropathy of lumbar facet joint 04/02/2023   History of bilateral mastoidectomy 09/19/2021   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/19/2021   Mastoid disorder, left 09/16/2019   Left foot pain 02/14/2014   BPH (benign prostatic hyperplasia)    Barrett's esophagus    Ulcer disease    GERD (gastroesophageal reflux disease) 05/24/2012   HTN (hypertension) 05/24/2012   History of colonic polyps 05/24/2012    PCP: Carylon Perches, MD REFERRING PROVIDER: Teryl Lucy,  MD  ONSET DATE: 12/16/23  REFERRING DIAG: Left Total Shoulder Replacement  THERAPY DIAG:  Acute pain of left shoulder  Stiffness of left shoulder, not elsewhere classified  Other symptoms and signs involving the musculoskeletal system  Rationale for Evaluation and Treatment: Rehabilitation  SUBJECTIVE:   SUBJECTIVE STATEMENT: "My hand is still numb."  PERTINENT HISTORY: Pt underwent L TSA on 12/16/23. PMH from chart listed above.  PRECAUTIONS: Shoulder  WEIGHT BEARING RESTRICTIONS: Yes >10lbs  PAIN:  Are you having pain? No  FALLS: Has patient fallen in last 6 months? Yes. Number of falls 2 falls  PLOF: Independent  PATIENT GOALS: To get the shoulder back moving  NEXT MD VISIT: 02/01/24  OBJECTIVE:   HAND DOMINANCE: Right  ADLs: Overall ADLs: Pt unable to lift or mobilize his LUE, limiting his ability to complete ADL's independently or without max compensatory strategies. Pt also is unable to lift or carry objects during cooking and cleaning tasks.   FUNCTIONAL OUTCOME MEASURES: Upper Extremity Functional Scale (UEFS): 52/80 - 65%  UPPER EXTREMITY ROM:       Assessed in supine, er/IR adducted  Passive ROM Left eval  Shoulder flexion 109  Shoulder abduction 110  Shoulder internal rotation 90  Shoulder external rotation 37  (Blank rows = not tested)    UPPER EXTREMITY MMT:     Assessed in seated, er/IR adducted 01/05/24: Unable to assess per protocol  MMT Left eval  Shoulder flexion   Shoulder abduction   Shoulder internal rotation   Shoulder external rotation   (Blank rows = not tested)  SENSATION: Numbness/tingling in the wrist and hand  EDEMA: no swelling noted  OBSERVATIONS: moderate fascial restrictions noted around the biceps, trapezius, and scapular region.   TODAY'S TREATMENT:                                                                                                                              DATE:  01/21/24 -Manual Therapy:  myofascial release and trigger point applied to biceps, trapezius, and scapular region in order to reduce fascial restrictions and pain, as well as improve ROM -P/ROM: supine-flexion, abduction, er, IR, horizontal abduction, 5 reps -Isometrics: supine-flexion, extension, abduction, er, IR, 3x5" holds -AA/ROM: supine-protraction, flexion, horizontal abduction, er, abduction, 10 reps -  Thumb tacks: 1' low level -Prot/ret/elev/dep: 1' low level -Pulleys: 1' flexion, 1' abduction -Therapy ball rolls: flexion, abduction, 10 reps -Scapular A/ROM: extension, row, 10 reps  01/19/24 -Manual Therapy: myofascial release and trigger point applied to biceps, trapezius, and scapular region in order to reduce fascial restrictions and pain, as well as improve ROM -P/ROM: flexion, abduction, er/IR, x10 -AA/ROM: flexion, abduction, protraction, horizontal abduction, er/IR, x10 -Pulleys: flexion, abduction, x60" -[facility]: flexion, abduction, x10  01/15/24 -Manual:manual techniques to pectoralis region, trapezius, deltoid to decrease fascial restrictions and pain, increase ROM  -P/ROM: supine; flexion, abduction, internal/external rotation 10x  -Table slides: seated; flexion, abduction 10 each    PATIENT EDUCATION: Education details: edema glove wear Person educated: Patient Education method: Explanation, Demonstration, and Handouts Education comprehension: verbalized understanding and returned demonstration  HOME EXERCISE PROGRAM: 2/4: Wrist and Elbow ROM, Pendulums 2/14: AA/ROM  GOALS: Goals reviewed with patient? Yes   SHORT TERM GOALS: Target date: 02/04/24  Pt will be provided with and educated on HEP to improve mobility in LUE required for use during ADL completion.   Goal status: IN PROGRESS  2.  Pt will increase LUE P/ROM by 30 degrees to improve ability to use LUE during dressing tasks with minimal compensatory techniques.   Goal status: IN PROGRESS  3.  Pt will increase LUE  strength to 3+/5 to improve ability to reach for items at waist to chest height during bathing and grooming tasks.   Goal status: IN PROGRESS  LONG TERM GOALS: Target date: 03/04/24  Pt will decrease pain in LUE to 3/10 or less to improve ability to sleep for 2+ consecutive hours without waking due to pain.   Goal status: IN PROGRESS  2.  Pt will decrease LUE fascial restrictions to min amounts or less to improve mobility required for functional reaching tasks.   Goal status: IN PROGRESS  3.  Pt will increase LUE A/ROM by 50 degrees to improve ability to use LUE when reaching overhead or behind back during dressing and bathing tasks.   Goal status: IN PROGRESS  4.  Pt will increase LUE strength to 4+/5 or greater to improve ability to use LUE when lifting or carrying items during meal preparation/housework/yardwork tasks.   Goal status: IN PROGRESS  5.  Pt will return to highest level of function using LUE as non-dominant during functional task completion.   Goal status: IN PROGRESS   ASSESSMENT:  CLINICAL IMPRESSION: Pt reports improved swelling in his hand today, however continues to have swelling when he tries to straighten his elbow and let his arm hang down. Completing manual techniques and passive stretching, pt tolerating approximately 75% ROM with passive stretching and AA/ROM. Provided edema glove to assist with swelling when pt has arm hanging down my his side. Added thumb tacks and prot/ret/elev/dep, pt unable to extend wrist and elbow therefore completed at low level. Focused on posture during scapular tasks. Verbal and tactile cuing provided for positioning and technique throughout session.   PERFORMANCE DEFICITS: in functional skills including in functional skills including ADLs, IADLs, coordination, tone, ROM, strength, pain, fascial restrictions, muscle spasms, and UE functional use.   PLAN:  OT FREQUENCY: 2x/week  OT DURATION: 8 weeks  PLANNED INTERVENTIONS:  97168 OT Re-evaluation, 97535 self care/ADL training, 16109 therapeutic exercise, 97530 therapeutic activity, 97112 neuromuscular re-education, 97140 manual therapy, 97035 ultrasound, 97010 moist heat, 97032 electrical stimulation (manual), passive range of motion, functional mobility training, energy conservation, coping strategies training, patient/family education, and DME and/or AE  instructions  RECOMMENDED OTHER SERVICES: N/A  CONSULTED AND AGREED WITH PLAN OF CARE: Patient  PLAN FOR NEXT SESSION: Manual Therapy, P/ROM, Table Slides, Low Level Wall Washes, AA/ROM   Ezra Sites, OTR/L  [phone] 01/21/2024, 12:32 PM

## 2024-01-26 ENCOUNTER — Encounter (HOSPITAL_COMMUNITY): Payer: Self-pay | Admitting: Occupational Therapy

## 2024-01-26 ENCOUNTER — Ambulatory Visit (HOSPITAL_COMMUNITY): Payer: PPO | Admitting: Occupational Therapy

## 2024-01-26 DIAGNOSIS — M25512 Pain in left shoulder: Secondary | ICD-10-CM | POA: Diagnosis not present

## 2024-01-26 DIAGNOSIS — M25612 Stiffness of left shoulder, not elsewhere classified: Secondary | ICD-10-CM

## 2024-01-26 DIAGNOSIS — R29898 Other symptoms and signs involving the musculoskeletal system: Secondary | ICD-10-CM

## 2024-01-26 NOTE — Therapy (Signed)
 OUTPATIENT OCCUPATIONAL THERAPY ORTHO TREATMENT  Patient Name: Frank [name] MRN: [id number] DOB:November 24, 1951, 73 y.o., male Today's Date: 01/26/2024   END OF SESSION:  OT End of Session - 01/26/24 1103     Visit Number 5    Number of Visits 16    Date for OT Re-Evaluation 03/04/24    Authorization Type [employer], $15 copay    Authorization Time Period no auth no limit    Progress Note Due on Visit 10    OT Start Time 0935    OT Stop Time 1013    OT Time Calculation (min) 38 min    Activity Tolerance Patient tolerated treatment well    Behavior During Therapy WFL for tasks assessed/performed               Past Medical History:  Diagnosis Date   Arthritis    Barrett's esophagus    BPH (benign prostatic hyperplasia)    Chronic kidney disease    Fracture of 5th metatarsal    GERD (gastroesophageal reflux disease)    Hypercholesteremia    Hypertension    Mastoiditis    Pre-diabetes    Ulcer disease    Bulbar   Past Surgical History:  Procedure Laterality Date   BACK SURGERY     BIOPSY  12/12/2021   Procedure: BIOPSY;  Surgeon: Malissa Hippo, MD;  [facility]: [facility];  Service: Endoscopy;;   COLONOSCOPY     COLONOSCOPY     COLONOSCOPY N/A 08/27/2016   Procedure: COLONOSCOPY;  Surgeon: Malissa Hippo, MD;  [facility]: [facility];  Service: Endoscopy;  Laterality: N/A;  930 - moved to 9/27 @ 12:00 - Ann notified pt   COLONOSCOPY N/A 12/12/2021   Procedure: COLONOSCOPY;  Surgeon: Malissa Hippo, MD;  [facility]: [facility];  Service: Endoscopy;  Laterality: N/A;  730   HEMORRHOID SURGERY  12/02/2007   INGUINAL HERNIA REPAIR Right 10/15/2020   Procedure: OPEN RIGHT INGUINAL HERNIA REPAIR WITH MESH;  Surgeon: Ovidio Kin, MD;  Location: [facility] OR;  Service: General;  Laterality: Right;   INSERTION OF MESH Right 10/15/2020   Procedure: INSERTION OF MESH;  Surgeon: Ovidio Kin, MD;  Location: [facility];  Service: General;  Laterality: Right;   KNEE  ARTHROSCOPY     Right Knee   LAPAROTOMY N/A 06/20/2023   Procedure: EXPLORATORY LAPAROTOMY DRAIN ABDOMINAL ABSCESS;  Surgeon: [name], MD;  Location: [facility];  Service: General;  Laterality: N/A;   ORIF TOE FRACTURE Left 12/09/2013   Procedure: LEFT OPEN REDUCTION INTERNAL FIXATION (ORIF) FIFTH METATARSAL FRACTURE;  Surgeon: Nilda Simmer, MD;  Location: [facility];  Service: Orthopedics;  Laterality: Left;   POLYPECTOMY  08/27/2016   Procedure: POLYPECTOMY;  Surgeon: Malissa Hippo, MD;  [facility];  Service: Endoscopy;;  colon   POLYPECTOMY  12/12/2021   Procedure: POLYPECTOMY;  Surgeon: Malissa Hippo, MD;  [facility]: [facility];  Service: Endoscopy;;   rotor cuff  12/02/2007   right-   TONSILLECTOMY     TOTAL SHOULDER REPLACEMENT     UPPER GASTROINTESTINAL ENDOSCOPY     Patient Active Problem List   Diagnosis Date Noted   Actinic keratosis 11/06/2023   Central perforation of tympanic membrane of left ear 11/06/2023   Dysfunction of both eustachian tubes 11/06/2023   Kidney stones 11/06/2023   Diverticulitis of large intestine with abscess 06/20/2023   Sinus bradycardia 06/20/2023   Hypokalemia 06/20/2023   Rash 06/20/2023  SBO (small bowel obstruction) (HCC) 06/20/2023   Perforated abdominal viscus 06/20/2023   Diverticulitis large intestine w/o perforation or abscess w/o bleeding 06/20/2023   Spondylolisthesis of lumbar region 06/11/2023   Arthropathy of lumbar facet joint 04/02/2023   History of bilateral mastoidectomy 09/19/2021   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/19/2021   Mastoid disorder, left 09/16/2019   Left foot pain 02/14/2014   BPH (benign prostatic hyperplasia)    Barrett's esophagus    Ulcer disease    GERD (gastroesophageal reflux disease) 05/24/2012   HTN (hypertension) 05/24/2012   History of colonic polyps 05/24/2012    PCP: Carylon Perches, MD REFERRING PROVIDER: Teryl Lucy,  MD  ONSET DATE: 12/16/23  REFERRING DIAG: Left Total Shoulder Replacement  THERAPY DIAG:  Acute pain of left shoulder  Stiffness of left shoulder, not elsewhere classified  Other symptoms and signs involving the musculoskeletal system  Rationale for Evaluation and Treatment: Rehabilitation  SUBJECTIVE:   SUBJECTIVE STATEMENT: "I hurts when I turn my arm over." (Supination)  PERTINENT HISTORY: Pt underwent L TSA on 12/16/23. PMH from chart listed above.  PRECAUTIONS: Shoulder  WEIGHT BEARING RESTRICTIONS: Yes >10lbs  PAIN:  Are you having pain? Yes: NPRS scale: 3/10 Pain location: forearm Pain description: sharp Aggravating factors: supination/pronation Relieving factors: rest  FALLS: Has patient fallen in last 6 months? Yes. Number of falls 2 falls  PLOF: Independent  PATIENT GOALS: To get the shoulder back moving  NEXT MD VISIT: 02/01/24  OBJECTIVE:   HAND DOMINANCE: Right  ADLs: Overall ADLs: Pt unable to lift or mobilize his LUE, limiting his ability to complete ADL's independently or without max compensatory strategies. Pt also is unable to lift or carry objects during cooking and cleaning tasks.   FUNCTIONAL OUTCOME MEASURES: Upper Extremity Functional Scale (UEFS): 52/80 - 65%  UPPER EXTREMITY ROM:       Assessed in supine, er/IR adducted  Passive ROM Left eval  Shoulder flexion 109  Shoulder abduction 110  Shoulder internal rotation 90  Shoulder external rotation 37  (Blank rows = not tested)    UPPER EXTREMITY MMT:     Assessed in seated, er/IR adducted 01/05/24: Unable to assess per protocol  MMT Left eval  Shoulder flexion   Shoulder abduction   Shoulder internal rotation   Shoulder external rotation   (Blank rows = not tested)  SENSATION: Numbness/tingling in the wrist and hand  EDEMA: no swelling noted  OBSERVATIONS: moderate fascial restrictions noted around the biceps, trapezius, and scapular region.   TODAY'S TREATMENT:                                                                                                                               DATE:  01/26/24 -Manual Therapy: myofascial release and trigger point applied to biceps, trapezius, and scapular region in order to reduce fascial restrictions and pain, as well as improve ROM -P/ROM: supine-flexion, abduction, er, IR, horizontal abduction,  5 reps -AA/ROM: supine-protraction, flexion, horizontal abduction, er, abduction, 12 reps -Proximal shoulder strengthening: supine-paddles, criss cross, circles each direction, 10 reps -AA/ROM: standing-protraction, flexion, horizontal abduction, er, abduction, 10 reps -Thumb tacks: 1' low level -Prot/ret/elev/dep: 1' low level -Pulleys: 1' flexion, 1' abduction -Scapular theraband: yellow theraband-row, extension, 10 reps  01/21/24 -Manual Therapy: myofascial release and trigger point applied to biceps, trapezius, and scapular region in order to reduce fascial restrictions and pain, as well as improve ROM -P/ROM: supine-flexion, abduction, er, IR, horizontal abduction, 5 reps -Isometrics: supine-flexion, extension, abduction, er, IR, 3x5" holds -AA/ROM: supine-protraction, flexion, horizontal abduction, er, abduction, 10 reps -Thumb tacks: 1' low level -Prot/ret/elev/dep: 1' low level -Pulleys: 1' flexion, 1' abduction -Therapy ball rolls: flexion, abduction, 10 reps -Scapular A/ROM: extension, row, 10 reps  01/19/24 -Manual Therapy: myofascial release and trigger point applied to biceps, trapezius, and scapular region in order to reduce fascial restrictions and pain, as well as improve ROM -P/ROM: flexion, abduction, er/IR, x10 -AA/ROM: flexion, abduction, protraction, horizontal abduction, er/IR, x10 -Pulleys: flexion, abduction, x60" -[facility]: flexion, abduction, x10    PATIENT EDUCATION: Education details: edema glove wear Person educated: Patient Education method: Programmer, multimedia, Demonstration, and  Handouts Education comprehension: verbalized understanding and returned demonstration  HOME EXERCISE PROGRAM: 2/4: Wrist and Elbow ROM, Pendulums 2/14: AA/ROM  GOALS: Goals reviewed with patient? Yes   SHORT TERM GOALS: Target date: 02/04/24  Pt will be provided with and educated on HEP to improve mobility in LUE required for use during ADL completion.   Goal status: IN PROGRESS  2.  Pt will increase LUE P/ROM by 30 degrees to improve ability to use LUE during dressing tasks with minimal compensatory techniques.   Goal status: IN PROGRESS  3.  Pt will increase LUE strength to 3+/5 to improve ability to reach for items at waist to chest height during bathing and grooming tasks.   Goal status: IN PROGRESS  LONG TERM GOALS: Target date: 03/04/24  Pt will decrease pain in LUE to 3/10 or less to improve ability to sleep for 2+ consecutive hours without waking due to pain.   Goal status: IN PROGRESS  2.  Pt will decrease LUE fascial restrictions to min amounts or less to improve mobility required for functional reaching tasks.   Goal status: IN PROGRESS  3.  Pt will increase LUE A/ROM by 50 degrees to improve ability to use LUE when reaching overhead or behind back during dressing and bathing tasks.   Goal status: IN PROGRESS  4.  Pt will increase LUE strength to 4+/5 or greater to improve ability to use LUE when lifting or carrying items during meal preparation/housework/yardwork tasks.   Goal status: IN PROGRESS  5.  Pt will return to highest level of function using LUE as non-dominant during functional task completion.   Goal status: IN PROGRESS   ASSESSMENT:  CLINICAL IMPRESSION: Pt reports his shoulder is feeling ok but he is having pain in his forearm when supinating it. Continued with manual techniques and passive stretching, ROM to approximately 75% today. Progressed AA/ROM and adding in standing. Also adding proximal shoulder strengthening and gentle scapular  theraband for stability work. Pt with mild to mod fatigue with new standing tasks. Focused on posture during all tasks. Verbal and tactile cuing provided for positioning and technique throughout session.   PERFORMANCE DEFICITS: in functional skills including in functional skills including ADLs, IADLs, coordination, tone, ROM, strength, pain, fascial restrictions, muscle spasms, and UE functional use.   PLAN:  OT FREQUENCY: 2x/week  OT DURATION: 8 weeks  PLANNED INTERVENTIONS: 97168 OT Re-evaluation, 97535 self care/ADL training, 16109 therapeutic exercise, 97530 therapeutic activity, 97112 neuromuscular re-education, 97140 manual therapy, 97035 ultrasound, 97010 moist heat, 97032 electrical stimulation (manual), passive range of motion, functional mobility training, energy conservation, coping strategies training, patient/family education, and DME and/or AE instructions  CONSULTED AND AGREED WITH PLAN OF CARE: Patient  PLAN FOR NEXT SESSION: Manual Therapy, AA/ROM progressing to A/ROM as protocol allows and is appropriate   Ezra Sites, OTR/L  [phone] 01/26/2024, 11:04 AM

## 2024-01-28 ENCOUNTER — Ambulatory Visit (HOSPITAL_COMMUNITY): Payer: PPO | Admitting: Occupational Therapy

## 2024-01-28 ENCOUNTER — Encounter (HOSPITAL_COMMUNITY): Payer: Self-pay | Admitting: Occupational Therapy

## 2024-01-28 DIAGNOSIS — M25512 Pain in left shoulder: Secondary | ICD-10-CM

## 2024-01-28 DIAGNOSIS — M25612 Stiffness of left shoulder, not elsewhere classified: Secondary | ICD-10-CM

## 2024-01-28 DIAGNOSIS — R29898 Other symptoms and signs involving the musculoskeletal system: Secondary | ICD-10-CM

## 2024-01-28 NOTE — Therapy (Signed)
 OUTPATIENT OCCUPATIONAL THERAPY ORTHO TREATMENT  Patient Name: ELYAN VANWIEREN MRN: [id number] DOB:01-19-1951, 73 y.o., male Today's Date: 01/28/2024   END OF SESSION:  OT End of Session - 01/28/24 1058     Visit Number 6    Number of Visits 16    Date for OT Re-Evaluation 03/04/24    Authorization Type [employer], $15 copay    Authorization Time Period no auth no limit    Progress Note Due on Visit 10    OT Start Time 1019    OT Stop Time 1058    OT Time Calculation (min) 39 min    Activity Tolerance Patient tolerated treatment well    Behavior During Therapy WFL for tasks assessed/performed                Past Medical History:  Diagnosis Date   Arthritis    Barrett's esophagus    BPH (benign prostatic hyperplasia)    Chronic kidney disease    Fracture of 5th metatarsal    GERD (gastroesophageal reflux disease)    Hypercholesteremia    Hypertension    Mastoiditis    Pre-diabetes    Ulcer disease    Bulbar   Past Surgical History:  Procedure Laterality Date   BACK SURGERY     BIOPSY  12/12/2021   Procedure: BIOPSY;  Surgeon: Malissa Hippo, MD;  [facility];  Service: Endoscopy;;   COLONOSCOPY     COLONOSCOPY     COLONOSCOPY N/A 08/27/2016   Procedure: COLONOSCOPY;  Surgeon: Malissa Hippo, MD;  [facility];  Service: Endoscopy;  Laterality: N/A;  930 - moved to 9/27 @ 12:00 - Ann notified pt   COLONOSCOPY N/A 12/12/2021   Procedure: COLONOSCOPY;  Surgeon: Malissa Hippo, MD;  [facility];  Service: Endoscopy;  Laterality: N/A;  730   HEMORRHOID SURGERY  12/02/2007   INGUINAL HERNIA REPAIR Right 10/15/2020   Procedure: OPEN RIGHT INGUINAL HERNIA REPAIR WITH MESH;  Surgeon: Ovidio Kin, MD;  Location: [facility] OR;  Service: General;  Laterality: Right;   INSERTION OF MESH Right 10/15/2020   Procedure: INSERTION OF MESH;  Surgeon: Ovidio Kin, MD;  Location: [facility] OR;  Service: General;  Laterality: Right;   KNEE  ARTHROSCOPY     Right Knee   LAPAROTOMY N/A 06/20/2023   Procedure: EXPLORATORY LAPAROTOMY DRAIN ABDOMINAL ABSCESS;  Surgeon: [name], MD;  Location: [facility];  Service: General;  Laterality: N/A;   ORIF TOE FRACTURE Left 12/09/2013   Procedure: LEFT OPEN REDUCTION INTERNAL FIXATION (ORIF) FIFTH METATARSAL FRACTURE;  Surgeon: Nilda Simmer, MD;  [facility];  Service: Orthopedics;  Laterality: Left;   POLYPECTOMY  08/27/2016   Procedure: POLYPECTOMY;  Surgeon: Malissa Hippo, MD;  [facility];  Service: Endoscopy;;  colon   POLYPECTOMY  12/12/2021   Procedure: POLYPECTOMY;  Surgeon: Malissa Hippo, MD;  [facility];  Service: Endoscopy;;   rotor cuff  12/02/2007   right-   TONSILLECTOMY     TOTAL SHOULDER REPLACEMENT     UPPER GASTROINTESTINAL ENDOSCOPY     Patient Active Problem List   Diagnosis Date Noted   Actinic keratosis 11/06/2023   Central perforation of tympanic membrane of left ear 11/06/2023   Dysfunction of both eustachian tubes 11/06/2023   Kidney stones 11/06/2023   Diverticulitis of large intestine with abscess 06/20/2023   Sinus bradycardia 06/20/2023   Hypokalemia 06/20/2023   Rash 06/20/2023  SBO (small bowel obstruction) (HCC) 06/20/2023   Perforated abdominal viscus 06/20/2023   Diverticulitis large intestine w/o perforation or abscess w/o bleeding 06/20/2023   Spondylolisthesis of lumbar region 06/11/2023   Arthropathy of lumbar facet joint 04/02/2023   History of bilateral mastoidectomy 09/19/2021   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/19/2021   Mastoid disorder, left 09/16/2019   Left foot pain 02/14/2014   BPH (benign prostatic hyperplasia)    Barrett's esophagus    Ulcer disease    GERD (gastroesophageal reflux disease) 05/24/2012   HTN (hypertension) 05/24/2012   History of colonic polyps 05/24/2012    PCP: Carylon Perches, MD REFERRING PROVIDER: Teryl Lucy,  MD  ONSET DATE: 12/16/23  REFERRING DIAG: Left Total Shoulder Replacement  THERAPY DIAG:  Acute pain of left shoulder  Stiffness of left shoulder, not elsewhere classified  Other symptoms and signs involving the musculoskeletal system  Rationale for Evaluation and Treatment: Rehabilitation  SUBJECTIVE:   SUBJECTIVE STATEMENT: "It's doing ok I guess."   PERTINENT HISTORY: Pt underwent L TSA on 12/16/23. PMH from chart listed above.  PRECAUTIONS: Shoulder  WEIGHT BEARING RESTRICTIONS: Yes >10lbs  PAIN:  Are you having pain? No  FALLS: Has patient fallen in last 6 months? Yes. Number of falls 2 falls  PLOF: Independent  PATIENT GOALS: To get the shoulder back moving  NEXT MD VISIT: 02/01/24  OBJECTIVE:   HAND DOMINANCE: Right  ADLs: Overall ADLs: Pt unable to lift or mobilize his LUE, limiting his ability to complete ADL's independently or without max compensatory strategies. Pt also is unable to lift or carry objects during cooking and cleaning tasks.   FUNCTIONAL OUTCOME MEASURES: Upper Extremity Functional Scale (UEFS): 52/80 - 65%  UPPER EXTREMITY ROM:       Assessed in supine, er/IR adducted  Passive ROM Left eval Left 01/28/24  Shoulder flexion 109 119  Shoulder abduction 110 126  Shoulder internal rotation 90 90  Shoulder external rotation 37 47  (Blank rows = not tested)  Assessed in sitting, er/IR adducted  Active ROM Left eval Left 01/28/24  Shoulder flexion  100  Shoulder abduction  101  Shoulder internal rotation  90  Shoulder external rotation  55  (Blank rows = not tested)    UPPER EXTREMITY MMT:     Assessed in seated, er/IR adducted 01/05/24: Unable to assess per protocol 01/28/24: assessed on observation, no formal MMT  MMT Left eval Left 01/28/24  Shoulder flexion  3-/5  Shoulder abduction  3-/5  Shoulder internal rotation  3/5  Shoulder external rotation  3/5  (Blank rows = not tested)  SENSATION: Numbness/tingling in the  wrist and hand  EDEMA: no swelling noted  OBSERVATIONS: moderate fascial restrictions noted around the biceps, trapezius, and scapular region.   TODAY'S TREATMENT:  DATE:  01/28/24 -Manual Therapy: myofascial release and trigger point applied to biceps, trapezius, and scapular region in order to reduce fascial restrictions and pain, as well as improve ROM -P/ROM: supine-flexion, abduction, er, IR, horizontal abduction, 5 reps -AA/ROM: supine-protraction, flexion, horizontal abduction, er, abduction, 12 reps -AA/ROM: standing-protraction, flexion, horizontal abduction, er, abduction, 10 reps -Proximal shoulder strengthening: standing-paddles, criss cross, circles each direction, 10 reps -Scapular theraband: yellow theraband-row, extension, 10 reps  01/26/24 -Manual Therapy: myofascial release and trigger point applied to biceps, trapezius, and scapular region in order to reduce fascial restrictions and pain, as well as improve ROM -P/ROM: supine-flexion, abduction, er, IR, horizontal abduction, 5 reps -AA/ROM: supine-protraction, flexion, horizontal abduction, er, abduction, 12 reps -Proximal shoulder strengthening: supine-paddles, criss cross, circles each direction, 10 reps -AA/ROM: standing-protraction, flexion, horizontal abduction, er, abduction, 10 reps -Thumb tacks: 1' low level -Prot/ret/elev/dep: 1' low level -Pulleys: 1' flexion, 1' abduction -Scapular theraband: yellow theraband-row, extension, 10 reps  01/21/24 -Manual Therapy: myofascial release and trigger point applied to biceps, trapezius, and scapular region in order to reduce fascial restrictions and pain, as well as improve ROM -P/ROM: supine-flexion, abduction, er, IR, horizontal abduction, 5 reps -Isometrics: supine-flexion, extension, abduction, er, IR, 3x5" holds -AA/ROM: supine-protraction,  flexion, horizontal abduction, er, abduction, 10 reps -Thumb tacks: 1' low level -Prot/ret/elev/dep: 1' low level -Pulleys: 1' flexion, 1' abduction -Therapy ball rolls: flexion, abduction, 10 reps -Scapular A/ROM: extension, row, 10 reps    PATIENT EDUCATION: Education details: reviewed HEP Person educated: Patient Education method: Programmer, multimedia, Demonstration, and Handouts Education comprehension: verbalized understanding and returned demonstration  HOME EXERCISE PROGRAM: 2/4: Wrist and Elbow ROM, Pendulums 2/14: AA/ROM  GOALS: Goals reviewed with patient? Yes   SHORT TERM GOALS: Target date: 02/04/24  Pt will be provided with and educated on HEP to improve mobility in LUE required for use during ADL completion.   Goal status: IN PROGRESS  2.  Pt will increase LUE P/ROM by 30 degrees to improve ability to use LUE during dressing tasks with minimal compensatory techniques.   Goal status: IN PROGRESS  3.  Pt will increase LUE strength to 3+/5 to improve ability to reach for items at waist to chest height during bathing and grooming tasks.   Goal status: IN PROGRESS  LONG TERM GOALS: Target date: 03/04/24  Pt will decrease pain in LUE to 3/10 or less to improve ability to sleep for 2+ consecutive hours without waking due to pain.   Goal status: IN PROGRESS  2.  Pt will decrease LUE fascial restrictions to min amounts or less to improve mobility required for functional reaching tasks.   Goal status: IN PROGRESS  3.  Pt will increase LUE A/ROM by 50 degrees to improve ability to use LUE when reaching overhead or behind back during dressing and bathing tasks.   Goal status: IN PROGRESS  4.  Pt will increase LUE strength to 4+/5 or greater to improve ability to use LUE when lifting or carrying items during meal preparation/housework/yardwork tasks.   Goal status: IN PROGRESS  5.  Pt will return to highest level of function using LUE as non-dominant during functional task  completion.   Goal status: IN PROGRESS   ASSESSMENT:  CLINICAL IMPRESSION: Pt reports his shoulder is feeling ok today. He has an MD appointment on Monday. Measurements taken for upcoming MD appointment. Continued with manual techniques and passive stretching, ROM to approximately 75%. Continued AA/ROM in supine and standing, adding proximal shoulder strengthening in standing and continuing gentle  scapular theraband for stability. Pt with min fatigue during tasks today. Focused on posture during all tasks. Verbal and tactile cuing provided for positioning and technique throughout session.   PERFORMANCE DEFICITS: in functional skills including in functional skills including ADLs, IADLs, coordination, tone, ROM, strength, pain, fascial restrictions, muscle spasms, and UE functional use.   PLAN:  OT FREQUENCY: 2x/week  OT DURATION: 8 weeks  PLANNED INTERVENTIONS: 97168 OT Re-evaluation, 97535 self care/ADL training, 16109 therapeutic exercise, 97530 therapeutic activity, 97112 neuromuscular re-education, 97140 manual therapy, 97035 ultrasound, 97010 moist heat, 97032 electrical stimulation (manual), passive range of motion, functional mobility training, energy conservation, coping strategies training, patient/family education, and DME and/or AE instructions  CONSULTED AND AGREED WITH PLAN OF CARE: Patient  PLAN FOR NEXT SESSION: Follow up on MD appt, Manual Therapy, AA/ROM progressing to A/ROM as protocol allows and is appropriate   Ezra Sites, OTR/L  [phone] 01/28/2024, 10:59 AM

## 2024-02-02 ENCOUNTER — Encounter (HOSPITAL_COMMUNITY): Payer: PPO | Admitting: Occupational Therapy

## 2024-02-03 ENCOUNTER — Telehealth (HOSPITAL_COMMUNITY): Payer: Self-pay | Admitting: Occupational Therapy

## 2024-02-03 NOTE — Telephone Encounter (Signed)
 OT called and left HIPAA appropriate VM regarding No Show on 02/02/24 and reminding pt of next visit on 02/04/24. Requesting pt call back to confirm appointment.   Trish Mage, OTR/L [facility] [phone]

## 2024-02-04 ENCOUNTER — Ambulatory Visit (HOSPITAL_COMMUNITY): Payer: PPO | Attending: Orthopedic Surgery | Admitting: Occupational Therapy

## 2024-02-04 ENCOUNTER — Encounter (HOSPITAL_COMMUNITY): Payer: Self-pay | Admitting: Occupational Therapy

## 2024-02-04 DIAGNOSIS — M25512 Pain in left shoulder: Secondary | ICD-10-CM

## 2024-02-04 DIAGNOSIS — M25612 Stiffness of left shoulder, not elsewhere classified: Secondary | ICD-10-CM

## 2024-02-04 DIAGNOSIS — R29898 Other symptoms and signs involving the musculoskeletal system: Secondary | ICD-10-CM

## 2024-02-04 NOTE — Therapy (Signed)
 OUTPATIENT OCCUPATIONAL THERAPY ORTHO TREATMENT  Patient Name: Frank [name] MRN: [id number] DOB:September 27, 1951, 73 y.o., male Today's Date: 02/04/2024   END OF SESSION:  OT End of Session - 02/04/24 1346     Visit Number 7    Number of Visits 16    Date for OT Re-Evaluation 03/04/24    Authorization Type [employer], $15 copay    Authorization Time Period no auth no limit    Progress Note Due on Visit 10    OT Start Time 1304    OT Stop Time 1345    OT Time Calculation (min) 41 min    Activity Tolerance Patient tolerated treatment well    Behavior During Therapy WFL for tasks assessed/performed             Past Medical History:  Diagnosis Date   Arthritis    Barrett's esophagus    BPH (benign prostatic hyperplasia)    Chronic kidney disease    Fracture of 5th metatarsal    GERD (gastroesophageal reflux disease)    Hypercholesteremia    Hypertension    Mastoiditis    Pre-diabetes    Ulcer disease    Bulbar   Past Surgical History:  Procedure Laterality Date   BACK SURGERY     BIOPSY  12/12/2021   Procedure: BIOPSY;  Surgeon: Malissa Hippo, MD;  [facility]: [facility];  Service: Endoscopy;;   COLONOSCOPY     COLONOSCOPY     COLONOSCOPY N/A 08/27/2016   Procedure: COLONOSCOPY;  Surgeon: Malissa Hippo, MD;  Location: [facility];  Service: Endoscopy;  Laterality: N/A;  930 - moved to 9/27 @ 12:00 - Ann notified pt   COLONOSCOPY N/A 12/12/2021   Procedure: COLONOSCOPY;  Surgeon: Malissa Hippo, MD;  [facility]: [facility];  Service: Endoscopy;  Laterality: N/A;  730   HEMORRHOID SURGERY  12/02/2007   INGUINAL HERNIA REPAIR Right 10/15/2020   Procedure: OPEN RIGHT INGUINAL HERNIA REPAIR WITH MESH;  Surgeon: Ovidio Kin, MD;  Location: [facility] OR;  Service: General;  Laterality: Right;   INSERTION OF MESH Right 10/15/2020   Procedure: INSERTION OF MESH;  Surgeon: Ovidio Kin, MD;  Location: [facility] OR;  Service: General;  Laterality: Right;   KNEE  ARTHROSCOPY     Right Knee   LAPAROTOMY N/A 06/20/2023   Procedure: EXPLORATORY LAPAROTOMY DRAIN ABDOMINAL ABSCESS;  Surgeon: [name], MD;  Location: [facility];  Service: General;  Laterality: N/A;   ORIF TOE FRACTURE Left 12/09/2013   Procedure: LEFT OPEN REDUCTION INTERNAL FIXATION (ORIF) FIFTH METATARSAL FRACTURE;  Surgeon: Nilda Simmer, MD;  Location: [facility];  Service: Orthopedics;  Laterality: Left;   POLYPECTOMY  08/27/2016   Procedure: POLYPECTOMY;  Surgeon: Malissa Hippo, MD;  [facility];  Service: Endoscopy;;  colon   POLYPECTOMY  12/12/2021   Procedure: POLYPECTOMY;  Surgeon: Malissa Hippo, MD;  [facility];  Service: Endoscopy;;   rotor cuff  12/02/2007   right-   TONSILLECTOMY     TOTAL SHOULDER REPLACEMENT     UPPER GASTROINTESTINAL ENDOSCOPY     Patient Active Problem List   Diagnosis Date Noted   Actinic keratosis 11/06/2023   Central perforation of tympanic membrane of left ear 11/06/2023   Dysfunction of both eustachian tubes 11/06/2023   Kidney stones 11/06/2023   Diverticulitis of large intestine with abscess 06/20/2023   Sinus bradycardia 06/20/2023   Hypokalemia 06/20/2023   Rash 06/20/2023   SBO (  small bowel obstruction) (HCC) 06/20/2023   Perforated abdominal viscus 06/20/2023   Diverticulitis large intestine w/o perforation or abscess w/o bleeding 06/20/2023   Spondylolisthesis of lumbar region 06/11/2023   Arthropathy of lumbar facet joint 04/02/2023   History of bilateral mastoidectomy 09/19/2021   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/19/2021   Mastoid disorder, left 09/16/2019   Left foot pain 02/14/2014   BPH (benign prostatic hyperplasia)    Barrett's esophagus    Ulcer disease    GERD (gastroesophageal reflux disease) 05/24/2012   HTN (hypertension) 05/24/2012   History of colonic polyps 05/24/2012    PCP: Carylon Perches, MD REFERRING PROVIDER: Teryl Lucy,  MD  ONSET DATE: 12/16/23  REFERRING DIAG: Left Total Shoulder Replacement  THERAPY DIAG:  Acute pain of left shoulder  Stiffness of left shoulder, not elsewhere classified  Other symptoms and signs involving the musculoskeletal system  Rationale for Evaluation and Treatment: Rehabilitation  SUBJECTIVE:   SUBJECTIVE STATEMENT: "I had some pain the other night with my shoulder trying to sleep"   PERTINENT HISTORY: Pt underwent L TSA on 12/16/23. PMH from chart listed above.  PRECAUTIONS: Shoulder  WEIGHT BEARING RESTRICTIONS: Yes >10lbs  PAIN:  Are you having pain? No  FALLS: Has patient fallen in last 6 months? Yes. Number of falls 2 falls  PLOF: Independent  PATIENT GOALS: To get the shoulder back moving  NEXT MD VISIT: 03/03/24  OBJECTIVE:   HAND DOMINANCE: Right  ADLs: Overall ADLs: Pt unable to lift or mobilize his LUE, limiting his ability to complete ADL's independently or without max compensatory strategies. Pt also is unable to lift or carry objects during cooking and cleaning tasks.   FUNCTIONAL OUTCOME MEASURES: Upper Extremity Functional Scale (UEFS): 52/80 - 65%  UPPER EXTREMITY ROM:       Assessed in supine, er/IR adducted  Passive ROM Left eval Left 01/28/24  Shoulder flexion 109 119  Shoulder abduction 110 126  Shoulder internal rotation 90 90  Shoulder external rotation 37 47  (Blank rows = not tested)  Assessed in sitting, er/IR adducted  Active ROM Left eval Left 01/28/24  Shoulder flexion  100  Shoulder abduction  101  Shoulder internal rotation  90  Shoulder external rotation  55  (Blank rows = not tested)    UPPER EXTREMITY MMT:     Assessed in seated, er/IR adducted 01/05/24: Unable to assess per protocol 01/28/24: assessed on observation, no formal MMT  MMT Left eval Left 01/28/24  Shoulder flexion  3-/5  Shoulder abduction  3-/5  Shoulder internal rotation  3/5  Shoulder external rotation  3/5  (Blank rows = not  tested)  SENSATION: Numbness/tingling in the wrist and hand  EDEMA: no swelling noted  OBSERVATIONS: moderate fascial restrictions noted around the biceps, trapezius, and scapular region.   TODAY'S TREATMENT:  DATE:   02/04/23 -Manual Therapy: myofascial release and trigger point applied to biceps, trapezius, and scapular region in order to reduce fascial restrictions and pain, as well as improve ROM -P/ROM: supine-flexion, abduction, er, IR, horizontal abduction, 5 reps -AA/ROM: supine-protraction, flexion, horizontal abduction, er, abduction, 12 reps -A/ROM: supine- protraction, flexion, abduction, horizontal abduction, er/IR, 10 reps -Wall Slides: flexion, abduction, 10 reps -Scapular theraband: red theraband-row, extension, 10 reps -UBE: level 1, 2' forwards and backwards  01/28/24 -Manual Therapy: myofascial release and trigger point applied to biceps, trapezius, and scapular region in order to reduce fascial restrictions and pain, as well as improve ROM -P/ROM: supine-flexion, abduction, er, IR, horizontal abduction, 5 reps -AA/ROM: supine-protraction, flexion, horizontal abduction, er, abduction, 12 reps -AA/ROM: standing-protraction, flexion, horizontal abduction, er, abduction, 10 reps -Proximal shoulder strengthening: standing-paddles, criss cross, circles each direction, 10 reps -Scapular theraband: yellow theraband-row, extension, 10 reps  01/26/24 -Manual Therapy: myofascial release and trigger point applied to biceps, trapezius, and scapular region in order to reduce fascial restrictions and pain, as well as improve ROM -P/ROM: supine-flexion, abduction, er, IR, horizontal abduction, 5 reps -AA/ROM: supine-protraction, flexion, horizontal abduction, er, abduction, 12 reps -Proximal shoulder strengthening: supine-paddles, criss cross, circles each  direction, 10 reps -AA/ROM: standing-protraction, flexion, horizontal abduction, er, abduction, 10 reps -Thumb tacks: 1' low level -Prot/ret/elev/dep: 1' low level -Pulleys: 1' flexion, 1' abduction -Scapular theraband: yellow theraband-row, extension, 10 reps   PATIENT EDUCATION: Education details: A/ROM and Wall Climbs Person educated: Patient Education method: Programmer, multimedia, Demonstration, and Handouts Education comprehension: verbalized understanding and returned demonstration  HOME EXERCISE PROGRAM: 2/4: Wrist and Elbow ROM, Pendulums 2/14: AA/ROM 3/6: A/ROM and Wall Climbs  GOALS: Goals reviewed with patient? Yes   SHORT TERM GOALS: Target date: 02/04/24  Pt will be provided with and educated on HEP to improve mobility in LUE required for use during ADL completion.   Goal status: IN PROGRESS  2.  Pt will increase LUE P/ROM by 30 degrees to improve ability to use LUE during dressing tasks with minimal compensatory techniques.   Goal status: IN PROGRESS  3.  Pt will increase LUE strength to 3+/5 to improve ability to reach for items at waist to chest height during bathing and grooming tasks.   Goal status: IN PROGRESS  LONG TERM GOALS: Target date: 03/04/24  Pt will decrease pain in LUE to 3/10 or less to improve ability to sleep for 2+ consecutive hours without waking due to pain.   Goal status: IN PROGRESS  2.  Pt will decrease LUE fascial restrictions to min amounts or less to improve mobility required for functional reaching tasks.   Goal status: IN PROGRESS  3.  Pt will increase LUE A/ROM by 50 degrees to improve ability to use LUE when reaching overhead or behind back during dressing and bathing tasks.   Goal status: IN PROGRESS  4.  Pt will increase LUE strength to 4+/5 or greater to improve ability to use LUE when lifting or carrying items during meal preparation/housework/yardwork tasks.   Goal status: IN PROGRESS  5.  Pt will return to highest level of  function using LUE as non-dominant during functional task completion.   Goal status: IN PROGRESS   ASSESSMENT:  CLINICAL IMPRESSION: This session pt reporting no pain and was able to complete all tasks with minimal rest breaks. OT added A/ROM this session, as his P/ROM and AA/ROM are able to achieve 85-90% of full ROM. A/ROM did mildly increase discomfort, however he was able to  work through it. Verbal and tactile cuing provided for positioning and technique throughout session.   PERFORMANCE DEFICITS: in functional skills including in functional skills including ADLs, IADLs, coordination, tone, ROM, strength, pain, fascial restrictions, muscle spasms, and UE functional use.   PLAN:  OT FREQUENCY: 2x/week  OT DURATION: 8 weeks  PLANNED INTERVENTIONS: 97168 OT Re-evaluation, 97535 self care/ADL training, 82956 therapeutic exercise, 97530 therapeutic activity, 97112 neuromuscular re-education, 97140 manual therapy, 97035 ultrasound, 97010 moist heat, 97032 electrical stimulation (manual), passive range of motion, functional mobility training, energy conservation, coping strategies training, patient/family education, and DME and/or AE instructions  CONSULTED AND AGREED WITH PLAN OF CARE: Patient  PLAN FOR NEXT SESSION: Follow up on MD appt, Manual Therapy, AA/ROM progressing to A/ROM as protocol allows and is appropriate   Trish Mage, OTR/L [phone] 02/04/2024, 1:47 PM

## 2024-02-04 NOTE — Patient Instructions (Signed)

## 2024-02-08 ENCOUNTER — Encounter (HOSPITAL_COMMUNITY): Admitting: Occupational Therapy

## 2024-02-09 ENCOUNTER — Encounter (HOSPITAL_COMMUNITY): Payer: PPO | Admitting: Occupational Therapy

## 2024-02-11 ENCOUNTER — Encounter (HOSPITAL_COMMUNITY): Payer: Self-pay | Admitting: Occupational Therapy

## 2024-02-11 ENCOUNTER — Ambulatory Visit (HOSPITAL_COMMUNITY): Payer: PPO | Admitting: Occupational Therapy

## 2024-02-11 DIAGNOSIS — M25612 Stiffness of left shoulder, not elsewhere classified: Secondary | ICD-10-CM

## 2024-02-11 DIAGNOSIS — M25512 Pain in left shoulder: Secondary | ICD-10-CM | POA: Diagnosis not present

## 2024-02-11 DIAGNOSIS — R29898 Other symptoms and signs involving the musculoskeletal system: Secondary | ICD-10-CM

## 2024-02-11 NOTE — Therapy (Signed)
 OUTPATIENT OCCUPATIONAL THERAPY ORTHO TREATMENT  Patient Name: Frank [name] MRN: [id number] DOB:[birth year], 73 y.o., male Today's Date: 02/11/2024   END OF SESSION:  OT End of Session - 02/11/24 1231     Visit Number 8    Number of Visits 16    Date for OT Re-Evaluation 03/04/24    Authorization Type [employer], $15 copay    Authorization Time Period no auth no limit    Progress Note Due on Visit 10    OT Start Time 1152    OT Stop Time 1231    OT Time Calculation (min) 39 min    Activity Tolerance Patient tolerated treatment well    Behavior During Therapy WFL for tasks assessed/performed             Past Medical History:  Diagnosis Date   Arthritis    Barrett's esophagus    BPH (benign prostatic hyperplasia)    Chronic kidney disease    Fracture of 5th metatarsal    GERD (gastroesophageal reflux disease)    Hypercholesteremia    Hypertension    Mastoiditis    Pre-diabetes    Ulcer disease    Bulbar   Past Surgical History:  Procedure Laterality Date   BACK SURGERY     BIOPSY  12/12/2021   Procedure: BIOPSY;  Surgeon: Malissa Hippo, MD;  [facility];  Service: Endoscopy;;   COLONOSCOPY     COLONOSCOPY     COLONOSCOPY N/A 08/27/2016   Procedure: COLONOSCOPY;  Surgeon: Malissa Hippo, MD;  [facility];  Service: Endoscopy;  Laterality: N/A;  930 - moved to 9/27 @ 12:00 - Ann notified pt   COLONOSCOPY N/A 12/12/2021   Procedure: COLONOSCOPY;  Surgeon: Malissa Hippo, MD;  [facility];  Service: Endoscopy;  Laterality: N/A;  730   HEMORRHOID SURGERY  12/02/2007   INGUINAL HERNIA REPAIR Right 10/15/2020   Procedure: OPEN RIGHT INGUINAL HERNIA REPAIR WITH MESH;  Surgeon: Ovidio Kin, MD;  Location: [facility] OR;  Service: General;  Laterality: Right;   INSERTION OF MESH Right 10/15/2020   Procedure: INSERTION OF MESH;  Surgeon: Ovidio Kin, MD;  Location: [facility] OR;  Service: General;  Laterality: Right;   KNEE  ARTHROSCOPY     Right Knee   LAPAROTOMY N/A 06/20/2023   Procedure: EXPLORATORY LAPAROTOMY DRAIN ABDOMINAL ABSCESS;  Surgeon: [name], MD;  Location: [facility];  Service: General;  Laterality: N/A;   ORIF TOE FRACTURE Left 12/09/2013   Procedure: LEFT OPEN REDUCTION INTERNAL FIXATION (ORIF) FIFTH METATARSAL FRACTURE;  Surgeon: Nilda Simmer, MD;  Location: [facility];  Service: Orthopedics;  Laterality: Left;   POLYPECTOMY  08/27/2016   Procedure: POLYPECTOMY;  Surgeon: Malissa Hippo, MD;  [facility];  Service: Endoscopy;;  colon   POLYPECTOMY  12/12/2021   Procedure: POLYPECTOMY;  Surgeon: Malissa Hippo, MD;  [facility];  Service: Endoscopy;;   rotor cuff  12/02/2007   right-   TONSILLECTOMY     TOTAL SHOULDER REPLACEMENT     UPPER GASTROINTESTINAL ENDOSCOPY     Patient Active Problem List   Diagnosis Date Noted   Actinic keratosis 11/06/2023   Central perforation of tympanic membrane of left ear 11/06/2023   Dysfunction of both eustachian tubes 11/06/2023   Kidney stones 11/06/2023   Diverticulitis of large intestine with abscess 06/20/2023   Sinus bradycardia 06/20/2023   Hypokalemia 06/20/2023   Rash 06/20/2023   SBO (  small bowel obstruction) (HCC) 06/20/2023   Perforated abdominal viscus 06/20/2023   Diverticulitis large intestine w/o perforation or abscess w/o bleeding 06/20/2023   Spondylolisthesis of lumbar region 06/11/2023   Arthropathy of lumbar facet joint 04/02/2023   History of bilateral mastoidectomy 09/19/2021   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/19/2021   Mastoid disorder, left 09/16/2019   Left foot pain 02/14/2014   BPH (benign prostatic hyperplasia)    Barrett's esophagus    Ulcer disease    GERD (gastroesophageal reflux disease) 05/24/2012   HTN (hypertension) 05/24/2012   History of colonic polyps 05/24/2012    PCP: Carylon Perches, MD REFERRING PROVIDER: Teryl Lucy,  MD  ONSET DATE: 12/16/23  REFERRING DIAG: Left Total Shoulder Replacement  THERAPY DIAG:  Acute pain of left shoulder  Stiffness of left shoulder, not elsewhere classified  Other symptoms and signs involving the musculoskeletal system  Rationale for Evaluation and Treatment: Rehabilitation  SUBJECTIVE:   SUBJECTIVE STATEMENT: "I'm having to clean out my FIL's house and my body is killing me."   PERTINENT HISTORY: Pt underwent L TSA on 12/16/23. PMH from chart listed above.  PRECAUTIONS: Shoulder  WEIGHT BEARING RESTRICTIONS: Yes >10lbs  PAIN:  Are you having pain? No  FALLS: Has patient fallen in last 6 months? Yes. Number of falls 2 falls  PLOF: Independent  PATIENT GOALS: To get the shoulder back moving  NEXT MD VISIT: 03/03/24  OBJECTIVE:   HAND DOMINANCE: Right  ADLs: Overall ADLs: Pt unable to lift or mobilize his LUE, limiting his ability to complete ADL's independently or without max compensatory strategies. Pt also is unable to lift or carry objects during cooking and cleaning tasks.   FUNCTIONAL OUTCOME MEASURES: Upper Extremity Functional Scale (UEFS): 52/80 - 65%  UPPER EXTREMITY ROM:       Assessed in supine, er/IR adducted  Passive ROM Left eval Left 01/28/24  Shoulder flexion 109 119  Shoulder abduction 110 126  Shoulder internal rotation 90 90  Shoulder external rotation 37 47  (Blank rows = not tested)  Assessed in sitting, er/IR adducted  Active ROM Left eval Left 01/28/24  Shoulder flexion  100  Shoulder abduction  101  Shoulder internal rotation  90  Shoulder external rotation  55  (Blank rows = not tested)    UPPER EXTREMITY MMT:     Assessed in seated, er/IR adducted 01/05/24: Unable to assess per protocol 01/28/24: assessed on observation, no formal MMT  MMT Left eval Left 01/28/24  Shoulder flexion  3-/5  Shoulder abduction  3-/5  Shoulder internal rotation  3/5  Shoulder external rotation  3/5  (Blank rows = not  tested)  SENSATION: Numbness/tingling in the wrist and hand  EDEMA: no swelling noted  OBSERVATIONS: moderate fascial restrictions noted around the biceps, trapezius, and scapular region.   TODAY'S TREATMENT:  DATE:   02/11/24 -Manual Therapy: myofascial release and trigger point applied to biceps, trapezius, and scapular region in order to reduce fascial restrictions and pain, as well as improve ROM -P/ROM: supine-flexion, abduction, er, IR, horizontal abduction, 5 reps -AA/ROM: supine-protraction, flexion, horizontal abduction, er, abduction, 12 reps -A/ROM: supine- protraction, flexion, abduction, horizontal abduction, er/IR, 10 reps -ABC's in the air -Proximal shoulder strengthening: paddles, criss cross, circles both directions, 10 reps -AA/ROM: seated-protraction, flexion, horizontal abduction, er, abduction, 12 reps -UBE: level 1, 2.5' forwards and backwards  02/04/24 -Manual Therapy: myofascial release and trigger point applied to biceps, trapezius, and scapular region in order to reduce fascial restrictions and pain, as well as improve ROM -P/ROM: supine-flexion, abduction, er, IR, horizontal abduction, 5 reps -AA/ROM: supine-protraction, flexion, horizontal abduction, er, abduction, 12 reps -A/ROM: supine- protraction, flexion, abduction, horizontal abduction, er/IR, 10 reps -Wall Slides: flexion, abduction, 10 reps -Scapular theraband: red theraband-row, extension, 10 reps -UBE: level 1, 2' forwards and backwards  01/28/24 -Manual Therapy: myofascial release and trigger point applied to biceps, trapezius, and scapular region in order to reduce fascial restrictions and pain, as well as improve ROM -P/ROM: supine-flexion, abduction, er, IR, horizontal abduction, 5 reps -AA/ROM: supine-protraction, flexion, horizontal abduction, er, abduction, 12  reps -AA/ROM: standing-protraction, flexion, horizontal abduction, er, abduction, 10 reps -Proximal shoulder strengthening: standing-paddles, criss cross, circles each direction, 10 reps -Scapular theraband: yellow theraband-row, extension, 10 reps   PATIENT EDUCATION: Education details: Continue HEP Person educated: Patient Education method: Programmer, multimedia, Demonstration, and Handouts Education comprehension: verbalized understanding and returned demonstration  HOME EXERCISE PROGRAM: 2/4: Wrist and Elbow ROM, Pendulums 2/14: AA/ROM 3/6: A/ROM and Wall Climbs  GOALS: Goals reviewed with patient? Yes   SHORT TERM GOALS: Target date: 02/04/24  Pt will be provided with and educated on HEP to improve mobility in LUE required for use during ADL completion.   Goal status: IN PROGRESS  2.  Pt will increase LUE P/ROM by 30 degrees to improve ability to use LUE during dressing tasks with minimal compensatory techniques.   Goal status: IN PROGRESS  3.  Pt will increase LUE strength to 3+/5 to improve ability to reach for items at waist to chest height during bathing and grooming tasks.   Goal status: IN PROGRESS  LONG TERM GOALS: Target date: 03/04/24  Pt will decrease pain in LUE to 3/10 or less to improve ability to sleep for 2+ consecutive hours without waking due to pain.   Goal status: IN PROGRESS  2.  Pt will decrease LUE fascial restrictions to min amounts or less to improve mobility required for functional reaching tasks.   Goal status: IN PROGRESS  3.  Pt will increase LUE A/ROM by 50 degrees to improve ability to use LUE when reaching overhead or behind back during dressing and bathing tasks.   Goal status: IN PROGRESS  4.  Pt will increase LUE strength to 4+/5 or greater to improve ability to use LUE when lifting or carrying items during meal preparation/housework/yardwork tasks.   Goal status: IN PROGRESS  5.  Pt will return to highest level of function using LUE as  non-dominant during functional task completion.   Goal status: IN PROGRESS   ASSESSMENT:  CLINICAL IMPRESSION: This session pt continuing to improve with ROM and endurance. He was able to complete AA/ROM against gravity this session with 75% of full ROM. OT also added proximal shoulder exercises for shoulder stability and strengthening, which he tolerated well. Verbal and tactile cuing provided for positioning  and technique throughout session.   PERFORMANCE DEFICITS: in functional skills including in functional skills including ADLs, IADLs, coordination, tone, ROM, strength, pain, fascial restrictions, muscle spasms, and UE functional use.   PLAN:  OT FREQUENCY: 2x/week  OT DURATION: 8 weeks  PLANNED INTERVENTIONS: 97168 OT Re-evaluation, 97535 self care/ADL training, 21308 therapeutic exercise, 97530 therapeutic activity, 97112 neuromuscular re-education, 97140 manual therapy, 97035 ultrasound, 97010 moist heat, 97032 electrical stimulation (manual), passive range of motion, functional mobility training, energy conservation, coping strategies training, patient/family education, and DME and/or AE instructions  CONSULTED AND AGREED WITH PLAN OF CARE: Patient  PLAN FOR NEXT SESSION: Follow up on MD appt, Manual Therapy, AA/ROM progressing to A/ROM as protocol allows and is appropriate   Trish Mage, OTR/L [phone] 02/11/2024, 1:07 PM

## 2024-02-12 ENCOUNTER — Ambulatory Visit: Payer: PPO | Admitting: Urology

## 2024-02-12 ENCOUNTER — Encounter: Payer: Self-pay | Admitting: Urology

## 2024-02-12 VITALS — BP 117/80 | HR 72 | Temp 98.1°F

## 2024-02-12 DIAGNOSIS — N401 Enlarged prostate with lower urinary tract symptoms: Secondary | ICD-10-CM | POA: Diagnosis not present

## 2024-02-12 DIAGNOSIS — R350 Frequency of micturition: Secondary | ICD-10-CM | POA: Diagnosis not present

## 2024-02-12 DIAGNOSIS — R351 Nocturia: Secondary | ICD-10-CM | POA: Diagnosis not present

## 2024-02-12 DIAGNOSIS — R3129 Other microscopic hematuria: Secondary | ICD-10-CM

## 2024-02-12 DIAGNOSIS — N200 Calculus of kidney: Secondary | ICD-10-CM

## 2024-02-12 LAB — MICROSCOPIC EXAMINATION
Bacteria, UA: NONE SEEN
WBC, UA: NONE SEEN /HPF (ref 0–5)

## 2024-02-12 LAB — URINALYSIS, ROUTINE W REFLEX MICROSCOPIC
Bilirubin, UA: NEGATIVE
Glucose, UA: NEGATIVE
Ketones, UA: NEGATIVE
Leukocytes,UA: NEGATIVE
Nitrite, UA: NEGATIVE
Protein,UA: NEGATIVE
Specific Gravity, UA: 1.03 (ref 1.005–1.030)
Urobilinogen, Ur: 0.2 mg/dL (ref 0.2–1.0)
pH, UA: 5.5 (ref 5.0–7.5)

## 2024-02-12 LAB — BLADDER SCAN AMB NON-IMAGING: Scan Result: 1

## 2024-02-12 MED ORDER — TAMSULOSIN HCL 0.4 MG PO CAPS: 0.4000 mg | ORAL_CAPSULE | Freq: Every day | ORAL | 2 refills | Status: DC

## 2024-02-12 MED ORDER — GEMTESA 75 MG PO TABS
1.0000 | ORAL_TABLET | Freq: Every day | ORAL | Status: DC
Start: 2024-02-12 — End: 2024-06-08

## 2024-02-12 NOTE — Progress Notes (Signed)
 Name: Frank [name] DOB: 1950/12/23 MRN: [id number]  History of Present Illness: Mr. Frank [name] is a 73 y.o. male who presents today at [facility].  - GU history: 1. BPH with LUTS (frequency & nocturia). - 06/19/2023: CT abdomen/pelvis w/ contrast showed mild prostatomegaly. - Previously took Flomax. - No PSA results found per chart review; patient states that has been normal.  - He denies prior GU surgery. - He reports known family history of prostate cancer (brother). 2. Kidney stones. - 06/19/2023: CT abdomen/pelvis w/ contrast showed bilateral non-obstructive kidney stones.  At last visit with Dr. Annabell Howells on 01/14/2024: - Reported initial improvement of nocturia with Oxybutynin 5 mg nightly but then stopped helping so he discontinued it. Voiding 1-3x/night and reported some daytime frequency.  - Reported decreased caffeine intake (down to 1 diet Dr. Reino Frank [name] per day from 3). - Cystoscopy showed  short prostate with mild bilobar hyperplasia and a few inflammatory polyps.  He had mild trabeculation without mucosal lesions.  UO's were normal.   - No acute findings re: microscopic hematuria; attributed to tiny renal stones and some inflammatory polyps of the prostatic urethral. - For BPH with nocturia and frequency he was advised to avoid caffeine and to try Gemtesa (Vibegron) 75 mg daily.  Today: He denies significant symptomatic improvement since starting Gemtesa (Vibegron) 75 mg daily. Reports ongoing bothersome nocturia 2-4x/night. Denies bothersome daytime urinary frequency or urgency. Denies drinking any caffeine now.   He denies urinary incontinence, dysuria, gross hematuria, hesitancy, straining to void, or sensations of incomplete emptying.   Medications: Current Outpatient Medications  Medication Sig Dispense Refill   amLODipine (NORVASC) 5 MG tablet Take 5 mg by mouth daily.     aspirin EC 81 MG tablet Take 1 tablet (81 mg total) by mouth daily. Swallow  whole. 30 tablet 11   azithromycin (ZITHROMAX) 250 MG tablet Take first 2 tablets together, then 1 every day until finished. 6 tablet 0   chlorthalidone (HYGROTON) 25 MG tablet Take 25 mg by mouth daily.     cyclobenzaprine (FLEXERIL) 5 MG tablet Take 1 tablet (5 mg total) by mouth 3 (three) times daily as needed for muscle spasms. 50 tablet 0   docusate sodium (COLACE) 100 MG capsule Take 1 capsule (100 mg total) by mouth 2 (two) times daily. 30 capsule 0   ezetimibe (ZETIA) 10 MG tablet Take 10 mg by mouth at bedtime.     Ginger, Zingiber officinalis, (GINGER PO) Take 1 capsule by mouth daily.     Glucosamine HCl 1000 MG TABS Take 2,000 mg by mouth daily.     losartan (COZAAR) 100 MG tablet Take 100 mg by mouth daily.     methylPREDNISolone (MEDROL DOSEPAK) 4 MG TBPK tablet Use as directed on package. 21 tablet 0   Misc Natural Products (TART CHERRY ADVANCED PO) Take 1 tablet by mouth daily.     Multiple Vitamin (MULTIVITAMIN WITH MINERALS) TABS tablet Take 1 tablet by mouth daily.     Omega-3 Fatty Acids (FISH OIL) 1000 MG CAPS Take 1,000 mg by mouth daily.     omeprazole (PRILOSEC) 20 MG capsule Take 20 mg by mouth daily.      oxyCODONE (ROXICODONE) 5 MG immediate release tablet Take 1 tablet (5 mg total) by mouth every 4 (four) hours as needed. 15 tablet 0   pioglitazone (ACTOS) 15 MG tablet Take 15 mg by mouth daily.     promethazine-dextromethorphan (PROMETHAZINE-DM) 6.25-15 MG/5ML syrup Take 5 mLs  by mouth 4 (four) times daily as needed. 100 mL 0   tamsulosin (FLOMAX) 0.4 MG CAPS capsule Take 1 capsule (0.4 mg total) by mouth daily. 30 capsule 2   triamcinolone cream (KENALOG) 0.1 % Apply 1 Application topically 2 (two) times daily as needed. 80 g 0   TURMERIC PO Take 1 capsule by mouth daily.     Vibegron (GEMTESA) 75 MG TABS Take 1 tablet (75 mg total) by mouth daily.     No current facility-administered medications for this visit.    Allergies: Allergies  Allergen Reactions    Metformin    Amoxicillin Rash    Past Medical History:  Diagnosis Date   Arthritis    Barrett's esophagus    BPH (benign prostatic hyperplasia)    Chronic kidney disease    Fracture of 5th metatarsal    GERD (gastroesophageal reflux disease)    Hypercholesteremia    Hypertension    Mastoiditis    Pre-diabetes    Ulcer disease    Bulbar   Past Surgical History:  Procedure Laterality Date   BACK SURGERY     BIOPSY  12/12/2021   Procedure: BIOPSY;  Surgeon: Malissa Hippo, MD;  [facility];  Service: Endoscopy;;   COLONOSCOPY     COLONOSCOPY     COLONOSCOPY N/A 08/27/2016   Procedure: COLONOSCOPY;  Surgeon: Malissa Hippo, MD;  [facility]: [facility];  Service: Endoscopy;  Laterality: N/A;  930 - moved to 9/27 @ 12:00 - Ann notified pt   COLONOSCOPY N/A 12/12/2021   Procedure: COLONOSCOPY;  Surgeon: Malissa Hippo, MD;  [facility];  Service: Endoscopy;  Laterality: N/A;  730   HEMORRHOID SURGERY  12/02/2007   INGUINAL HERNIA REPAIR Right 10/15/2020   Procedure: OPEN RIGHT INGUINAL HERNIA REPAIR WITH MESH;  Surgeon: Ovidio Kin, MD;  [facility] OR;  Service: General;  Laterality: Right;   INSERTION OF MESH Right 10/15/2020   Procedure: INSERTION OF MESH;  Surgeon: Ovidio Kin, MD;  Location: [facility] OR;  Service: General;  Laterality: Right;   KNEE ARTHROSCOPY     Right Knee   LAPAROTOMY N/A 06/20/2023   Procedure: EXPLORATORY LAPAROTOMY DRAIN ABDOMINAL ABSCESS;  Surgeon: [name], MD;  Location: [facility];  Service: General;  Laterality: N/A;   ORIF TOE FRACTURE Left 12/09/2013   Procedure: LEFT OPEN REDUCTION INTERNAL FIXATION (ORIF) FIFTH METATARSAL FRACTURE;  Surgeon: Nilda Simmer, MD;  Location: [facility];  Service: Orthopedics;  Laterality: Left;   POLYPECTOMY  08/27/2016   Procedure: POLYPECTOMY;  Surgeon: Malissa Hippo, MD;  [facility];  Service: Endoscopy;;  colon   POLYPECTOMY  12/12/2021    Procedure: POLYPECTOMY;  Surgeon: Malissa Hippo, MD;  [facility];  Service: Endoscopy;;   rotor cuff  12/02/2007   right-   TONSILLECTOMY     TOTAL SHOULDER REPLACEMENT     UPPER GASTROINTESTINAL ENDOSCOPY     Family History  Problem Relation Age of Onset   COPD Mother    Heart disease Father    Healthy Sister    Lung cancer Brother    Heart disease Brother    Heart disease Sister    Prostate cancer Brother    Healthy Son    Healthy Son    Social History   Socioeconomic History   Marital status: Married    Spouse name: Not on file   Number of children: Not on file   Years  of education: Not on file   Highest education level: Not on file  Occupational History   Not on file  Tobacco Use   Smoking status: Never   Smokeless tobacco: Never  Vaping Use   Vaping status: Never Used  Substance and Sexual Activity   Alcohol use: No   Drug use: No   Sexual activity: Not on file  Other Topics Concern   Not on file  Social History Narrative   Not on file   Social Drivers of Health   Financial Resource Strain: Not on file  Food Insecurity: Low Risk  (11/06/2023)   Received from [facility]   Hunger Vital Sign    Worried About Running Out of Food in the Last Year: Never true    Ran Out of Food in the Last Year: Never true  Transportation Needs: No Transportation Needs (11/06/2023)   Received from [facility]    In the past 12 months, has lack of reliable transportation kept you from medical appointments, meetings, work or from getting things needed for daily living? : No  Physical Activity: Not on file  Stress: Not on file  Social Connections: Not on file  Intimate Partner Violence: Not on file    Review of Systems Constitutional: Patient denies any unintentional weight loss or change in strength lntegumentary: Patient denies any rashes or pruritus Cardiovascular: Patient denies chest pain or syncope Respiratory: Patient denies  shortness of breath Gastrointestinal: Patient denies constipation and diarrhea Musculoskeletal: Patient denies muscle cramps or weakness Neurologic: Patient denies convulsions or seizures Allergic/Immunologic: Patient denies recent allergic reaction(s) Hematologic/Lymphatic: Patient denies bleeding tendencies Endocrine: Patient denies heat/cold intolerance  GU: As per HPI.  OBJECTIVE Vitals:   02/12/24 1111  BP: 117/80  Pulse: 72  Temp: 98.1 F (36.7 C)   There is no height or weight on file to calculate BMI.  Physical Examination Constitutional: No obvious distress; patient is non-toxic appearing  Cardiovascular: No visible lower extremity edema.  Respiratory: The patient does not have audible wheezing/stridor; respirations do not appear labored  Gastrointestinal: Abdomen non-distended Musculoskeletal: Normal ROM of UEs  Skin: No obvious rashes/open sores  Neurologic: CN 2-12 grossly intact Psychiatric: Answered questions appropriately with normal affect  Hematologic/Lymphatic/Immunologic: No obvious bruises or sites of spontaneous bleeding  Urine microscopy: 3-10 RBC/hpf, otherwise unremarkable PVR: 1 ml  ASSESSMENT Benign prostatic hyperplasia with urinary frequency - Plan: Urinalysis, Routine w reflex microscopic, BLADDER SCAN AMB NON-IMAGING, Vibegron (GEMTESA) 75 MG TABS, tamsulosin (FLOMAX) 0.4 MG CAPS capsule  Persistent microscopic hematuria - Negative work up in 2025  Kidney stones  Nocturia - Plan: Vibegron (GEMTESA) 75 MG TABS, tamsulosin (FLOMAX) 0.4 MG CAPS capsule  We discussed the need to address bladder outlet obstruction as part of his overall problem therefore he was advised to restart Flomax (Tamsulosin) 0.4 mg daily in addition to continuing Gemtesa (Vibegron) 75 mg daily. We agreed to plan for follow up in 4 weeks for recheck. Patient verbalized understanding of and agreement with current plan. All questions were answered.  PLAN Advised the  following: 1. Flomax (Tamsulosin) 0.4 mg daily. 2. Gemtesa (Vibegron) 75 mg daily. 3. Avoid caffeine intake. 4. Return in about 4 weeks (around 03/11/2024) for UA, PVR, & f/u with Evette Georges NP.  Orders Placed This Encounter  Procedures   Urinalysis, Routine w reflex microscopic   BLADDER SCAN AMB NON-IMAGING    It has been explained that the patient is to follow regularly with their PCP in addition  to all other providers involved in their care and to follow instructions provided by these respective offices. Patient advised to contact urology clinic if any urologic-pertaining questions, concerns, new symptoms or problems arise in the interim period.  There are no Patient Instructions on file for this visit.  Electronically signed by:  Donnita Falls, FNP   02/12/24    11:50 AM

## 2024-02-16 ENCOUNTER — Encounter (HOSPITAL_COMMUNITY): Payer: PPO | Admitting: Occupational Therapy

## 2024-02-16 DIAGNOSIS — R200 Anesthesia of skin: Secondary | ICD-10-CM | POA: Diagnosis not present

## 2024-02-16 DIAGNOSIS — Z6828 Body mass index (BMI) 28.0-28.9, adult: Secondary | ICD-10-CM | POA: Diagnosis not present

## 2024-02-18 ENCOUNTER — Encounter (HOSPITAL_COMMUNITY): Payer: PPO | Admitting: Occupational Therapy

## 2024-02-18 ENCOUNTER — Telehealth (HOSPITAL_COMMUNITY): Payer: Self-pay | Admitting: Occupational Therapy

## 2024-02-18 NOTE — Telephone Encounter (Signed)
 OT spoke with pt regarding his No Show on 02/18/24 at 11am. Pt thought his appointment was at 11:45. OT provided confirmation for next visit on 02/25/24 at 11am, pt agreed.   Trish Mage, OTR/L [facility] [phone]

## 2024-02-19 DIAGNOSIS — M25562 Pain in left knee: Secondary | ICD-10-CM | POA: Diagnosis not present

## 2024-02-22 DIAGNOSIS — M1712 Unilateral primary osteoarthritis, left knee: Secondary | ICD-10-CM | POA: Diagnosis not present

## 2024-02-23 ENCOUNTER — Encounter (HOSPITAL_COMMUNITY): Payer: PPO | Admitting: Occupational Therapy

## 2024-02-25 ENCOUNTER — Ambulatory Visit (HOSPITAL_COMMUNITY): Payer: PPO | Admitting: Occupational Therapy

## 2024-02-25 ENCOUNTER — Encounter (HOSPITAL_COMMUNITY): Payer: Self-pay | Admitting: Occupational Therapy

## 2024-02-25 DIAGNOSIS — M25612 Stiffness of left shoulder, not elsewhere classified: Secondary | ICD-10-CM

## 2024-02-25 DIAGNOSIS — M25512 Pain in left shoulder: Secondary | ICD-10-CM | POA: Diagnosis not present

## 2024-02-25 DIAGNOSIS — R29898 Other symptoms and signs involving the musculoskeletal system: Secondary | ICD-10-CM

## 2024-02-25 NOTE — Therapy (Unsigned)
 OUTPATIENT OCCUPATIONAL THERAPY ORTHO TREATMENT DISCHARGE NOTE  Patient Name: Frank [name] MRN: [id number] DOB:Dec 15, 1950, 73 y.o., male Today's Date: 02/26/2024  OCCUPATIONAL THERAPY DISCHARGE SUMMARY  Visits from Start of Care: 9  Current functional level related to goals / functional outcomes: Pt met 7 out of 8 OT goals. His strength and functional use of his arm is WFL, pain and fasical restrictions are minimal.     Remaining deficits: Pt continues to have mildly limited ROM.   Education / Equipment: Pt provided a comprehensive HEP;   Plan: Patient agrees to discharge as he feels near his baseline.         END OF SESSION:  OT End of Session - 02/25/24 1145     Visit Number 9    Number of Visits 16    Date for OT Re-Evaluation 03/04/24    Authorization Type [employer], $15 copay    Authorization Time Period no auth no limit    Progress Note Due on Visit 10    OT Start Time 1107    OT Stop Time 1145    OT Time Calculation (min) 38 min    Activity Tolerance Patient tolerated treatment well    Behavior During Therapy WFL for tasks assessed/performed              Past Medical History:  Diagnosis Date   Arthritis    Barrett's esophagus    BPH (benign prostatic hyperplasia)    Chronic kidney disease    Fracture of 5th metatarsal    GERD (gastroesophageal reflux disease)    Hypercholesteremia    Hypertension    Mastoiditis    Pre-diabetes    Ulcer disease    Bulbar   Past Surgical History:  Procedure Laterality Date   BACK SURGERY     BIOPSY  12/12/2021   Procedure: BIOPSY;  Surgeon: Malissa Hippo, MD;  [facility];  Service: Endoscopy;;   COLONOSCOPY     COLONOSCOPY     COLONOSCOPY N/A 08/27/2016   Procedure: COLONOSCOPY;  Surgeon: Malissa Hippo, MD;  [facility];  Service: Endoscopy;  Laterality: N/A;  930 - moved to 9/27 @ 12:00 - Ann notified pt   COLONOSCOPY N/A 12/12/2021   Procedure: COLONOSCOPY;   Surgeon: Malissa Hippo, MD;  [facility];  Service: Endoscopy;  Laterality: N/A;  730   HEMORRHOID SURGERY  12/02/2007   INGUINAL HERNIA REPAIR Right 10/15/2020   Procedure: OPEN RIGHT INGUINAL HERNIA REPAIR WITH MESH;  Surgeon: Ovidio Kin, MD;  Location: [facility] OR;  Service: General;  Laterality: Right;   INSERTION OF MESH Right 10/15/2020   Procedure: INSERTION OF MESH;  Surgeon: Ovidio Kin, MD;  Location: [facility] OR;  Service: General;  Laterality: Right;   KNEE ARTHROSCOPY     Right Knee   LAPAROTOMY N/A 06/20/2023   Procedure: EXPLORATORY LAPAROTOMY DRAIN ABDOMINAL ABSCESS;  Surgeon: [name], MD;  Location: [facility];  Service: General;  Laterality: N/A;   ORIF TOE FRACTURE Left 12/09/2013   Procedure: LEFT OPEN REDUCTION INTERNAL FIXATION (ORIF) FIFTH METATARSAL FRACTURE;  Surgeon: Nilda Simmer, MD;  Location: [facility];  Service: Orthopedics;  Laterality: Left;   POLYPECTOMY  08/27/2016   Procedure: POLYPECTOMY;  Surgeon: Malissa Hippo, MD;  [facility];  Service: Endoscopy;;  colon   POLYPECTOMY  12/12/2021   Procedure: POLYPECTOMY;  Surgeon: Malissa Hippo, MD;  [facility];  Service: Endoscopy;;  rotor cuff  12/02/2007   right-   TONSILLECTOMY     TOTAL SHOULDER REPLACEMENT     UPPER GASTROINTESTINAL ENDOSCOPY     Patient Active Problem List   Diagnosis Date Noted   Persistent microscopic hematuria 02/12/2024   Nocturia 02/12/2024   Actinic keratosis 11/06/2023   Central perforation of tympanic membrane of left ear 11/06/2023   Dysfunction of both eustachian tubes 11/06/2023   Kidney stones 11/06/2023   Diverticulitis of large intestine with abscess 06/20/2023   Sinus bradycardia 06/20/2023   Hypokalemia 06/20/2023   Rash 06/20/2023   SBO (small bowel obstruction) (HCC) 06/20/2023   Perforated abdominal viscus 06/20/2023   Diverticulitis large intestine w/o perforation or abscess w/o bleeding 06/20/2023    Spondylolisthesis of lumbar region 06/11/2023   Arthropathy of lumbar facet joint 04/02/2023   History of bilateral mastoidectomy 09/19/2021   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/19/2021   Mastoid disorder, left 09/16/2019   Left foot pain 02/14/2014   BPH (benign prostatic hyperplasia)    Barrett's esophagus    Ulcer disease    GERD (gastroesophageal reflux disease) 05/24/2012   HTN (hypertension) 05/24/2012   History of colonic polyps 05/24/2012    PCP: Carylon Perches, MD REFERRING PROVIDER: Teryl Lucy, MD  ONSET DATE: 12/16/23  REFERRING DIAG: Left Total Shoulder Replacement  THERAPY DIAG:  Acute pain of left shoulder  Stiffness of left shoulder, not elsewhere classified  Other symptoms and signs involving the musculoskeletal system  Rationale for Evaluation and Treatment: Rehabilitation  SUBJECTIVE:   SUBJECTIVE STATEMENT: "I'm really feeling much better"   PERTINENT HISTORY: Pt underwent L TSA on 12/16/23. PMH from chart listed above.  PRECAUTIONS: Shoulder  WEIGHT BEARING RESTRICTIONS: Yes >10lbs  PAIN:  Are you having pain? No  FALLS: Has patient fallen in last 6 months? Yes. Number of falls 2 falls  PLOF: Independent  PATIENT GOALS: To get the shoulder back moving  NEXT MD VISIT: 03/03/24  OBJECTIVE:   HAND DOMINANCE: Right  ADLs: Overall ADLs: Pt unable to lift or mobilize his LUE, limiting his ability to complete ADL's independently or without max compensatory strategies. Pt also is unable to lift or carry objects during cooking and cleaning tasks.   FUNCTIONAL OUTCOME MEASURES: Upper Extremity Functional Scale (UEFS): 52/80 - 65%  UPPER EXTREMITY ROM:       Assessed in supine, er/IR adducted  Passive ROM Left eval Left 01/28/24 Left 02/25/24  Shoulder flexion 109 119 147  Shoulder abduction 110 126 136  Shoulder internal rotation 90 90 90  Shoulder external rotation 37 47 78  (Blank rows = not  tested)  Assessed in sitting, er/IR adducted  Active ROM Left eval Left 01/28/24 Left 02/25/24  Shoulder flexion  100 136  Shoulder abduction  101 142  Shoulder internal rotation  90 90  Shoulder external rotation  55 57  (Blank rows = not tested)    UPPER EXTREMITY MMT:     Assessed in seated, er/IR adducted 01/05/24: Unable to assess per protocol 01/28/24: assessed on observation, no formal MMT  MMT Left eval Left 01/28/24 Left 02/25/24  Shoulder flexion  3-/5 5/5  Shoulder abduction  3-/5 5/5  Shoulder internal rotation  3/5 5/5  Shoulder external rotation  3/5 5/5  (Blank rows = not tested)  SENSATION: Numbness/tingling in the wrist and hand  EDEMA: no swelling noted  OBSERVATIONS: moderate fascial restrictions noted around the biceps, trapezius, and scapular region.   TODAY'S TREATMENT:  DATE:   02/25/24 -Manual Therapy: myofascial release and trigger point applied to biceps, trapezius, and scapular region in order to reduce fascial restrictions and pain, as well as improve ROM -P/ROM: supine-flexion, abduction, er, IR, horizontal abduction, 5 reps -A/ROM: seated- protraction, flexion, abduction, horizontal abduction, er/IR, 10 reps -Stretches: flexion, er on the door, corner stretch, er behind the back, bicep stretch, 4x10" -Measurements for reassessment  02/11/24 -Manual Therapy: myofascial release and trigger point applied to biceps, trapezius, and scapular region in order to reduce fascial restrictions and pain, as well as improve ROM -P/ROM: supine-flexion, abduction, er, IR, horizontal abduction, 5 reps -AA/ROM: supine-protraction, flexion, horizontal abduction, er, abduction, 12 reps -A/ROM: supine- protraction, flexion, abduction, horizontal abduction, er/IR, 10 reps -ABC's in the air -Proximal shoulder strengthening: paddles, criss  cross, circles both directions, 10 reps -AA/ROM: seated-protraction, flexion, horizontal abduction, er, abduction, 12 reps -UBE: level 1, 2.5' forwards and backwards  02/04/24 -Manual Therapy: myofascial release and trigger point applied to biceps, trapezius, and scapular region in order to reduce fascial restrictions and pain, as well as improve ROM -P/ROM: supine-flexion, abduction, er, IR, horizontal abduction, 5 reps -AA/ROM: supine-protraction, flexion, horizontal abduction, er, abduction, 12 reps -A/ROM: supine- protraction, flexion, abduction, horizontal abduction, er/IR, 10 reps -Wall Slides: flexion, abduction, 10 reps -Scapular theraband: red theraband-row, extension, 10 reps -UBE: level 1, 2' forwards and backwards   PATIENT EDUCATION: Education details: Continue HEP Person educated: Patient Education method: Explanation, Demonstration, and Handouts Education comprehension: verbalized understanding and returned demonstration  HOME EXERCISE PROGRAM: 2/4: Wrist and Elbow ROM, Pendulums 2/14: AA/ROM 3/6: A/ROM and Wall Climbs  GOALS: Goals reviewed with patient? Yes   SHORT TERM GOALS: Target date: 02/04/24  Pt will be provided with and educated on HEP to improve mobility in LUE required for use during ADL completion.   Goal status: MET  2.  Pt will increase LUE P/ROM by 30 degrees to improve ability to use LUE during dressing tasks with minimal compensatory techniques.   Goal status: MET  3.  Pt will increase LUE strength to 3+/5 to improve ability to reach for items at waist to chest height during bathing and grooming tasks.   Goal status: MET  LONG TERM GOALS: Target date: 03/04/24  Pt will decrease pain in LUE to 3/10 or less to improve ability to sleep for 2+ consecutive hours without waking due to pain.   Goal status: MET  2.  Pt will decrease LUE fascial restrictions to min amounts or less to improve mobility required for functional reaching tasks.   Goal  status: MET  3.  Pt will increase LUE A/ROM by 50 degrees to improve ability to use LUE when reaching overhead or behind back during dressing and bathing tasks.   Goal status: NOT MET  4.  Pt will increase LUE strength to 4+/5 or greater to improve ability to use LUE when lifting or carrying items during meal preparation/housework/yardwork tasks.   Goal status: MET  5.  Pt will return to highest level of function using LUE as non-dominant during functional task completion.   Goal status: MET   ASSESSMENT:  CLINICAL IMPRESSION: Pt requested to complete reassessment this session for discharge. He demonstrated improving ROM, near functional limits, as well as 5/5 strength in all motions. His pain continues to be minimal, as well as minimal fascial restrictions. OT provided stretches to assist pt with continuing to improve ROM at home. At this time, pt will be discharged from skilled OT, per his request.  PERFORMANCE DEFICITS: in functional skills including in functional skills including ADLs, IADLs, coordination, tone, ROM, strength, pain, fascial restrictions, muscle spasms, and UE functional use.   PLAN:  OT FREQUENCY: 2x/week  OT DURATION: 8 weeks  PLANNED INTERVENTIONS: 97168 OT Re-evaluation, 97535 self care/ADL training, 29528 therapeutic exercise, 97530 therapeutic activity, 97112 neuromuscular re-education, 97140 manual therapy, 97035 ultrasound, 97010 moist heat, 97032 electrical stimulation (manual), passive range of motion, functional mobility training, energy conservation, coping strategies training, patient/family education, and DME and/or AE instructions  CONSULTED AND AGREED WITH PLAN OF CARE: Patient  PLAN FOR NEXT SESSION: Discharge   Trish Mage, OTR/L [phone] 02/26/2024, 3:46 PM

## 2024-02-25 NOTE — Patient Instructions (Signed)

## 2024-03-09 DIAGNOSIS — R200 Anesthesia of skin: Secondary | ICD-10-CM | POA: Diagnosis not present

## 2024-03-11 ENCOUNTER — Ambulatory Visit: Admitting: Urology

## 2024-03-11 DIAGNOSIS — H6993 Unspecified Eustachian tube disorder, bilateral: Secondary | ICD-10-CM | POA: Diagnosis not present

## 2024-03-11 DIAGNOSIS — H95193 Other disorders following mastoidectomy, bilateral ears: Secondary | ICD-10-CM | POA: Diagnosis not present

## 2024-03-11 DIAGNOSIS — H906 Mixed conductive and sensorineural hearing loss, bilateral: Secondary | ICD-10-CM | POA: Diagnosis not present

## 2024-03-11 DIAGNOSIS — Z9089 Acquired absence of other organs: Secondary | ICD-10-CM | POA: Diagnosis not present

## 2024-03-11 DIAGNOSIS — H918X3 Other specified hearing loss, bilateral: Secondary | ICD-10-CM | POA: Diagnosis not present

## 2024-03-11 DIAGNOSIS — H7493 Unspecified disorder of middle ear and mastoid, bilateral: Secondary | ICD-10-CM | POA: Diagnosis not present

## 2024-03-21 DIAGNOSIS — M25562 Pain in left knee: Secondary | ICD-10-CM | POA: Diagnosis not present

## 2024-03-21 DIAGNOSIS — S46012D Strain of muscle(s) and tendon(s) of the rotator cuff of left shoulder, subsequent encounter: Secondary | ICD-10-CM | POA: Diagnosis not present

## 2024-03-21 DIAGNOSIS — M1712 Unilateral primary osteoarthritis, left knee: Secondary | ICD-10-CM | POA: Diagnosis not present

## 2024-04-04 ENCOUNTER — Telehealth: Payer: Self-pay | Admitting: Urology

## 2024-04-04 DIAGNOSIS — N401 Enlarged prostate with lower urinary tract symptoms: Secondary | ICD-10-CM

## 2024-04-04 MED ORDER — GEMTESA 75 MG PO TABS
75.0000 mg | ORAL_TABLET | Freq: Every day | ORAL | Status: DC
Start: 2024-04-04 — End: 2024-06-08

## 2024-04-04 NOTE — Telephone Encounter (Signed)
 Patient calling the office for samples of medication:   1.  What medication and dosage are you requesting samples for? Gemtesa    2.  Are you currently out of this medication?  He has 4 left, can you please give him enough to get to his next appointment.

## 2024-04-04 NOTE — Telephone Encounter (Addendum)
 Samples up front and unable to reach patient by phone.My chart message sent making patient aware of samples up front.

## 2024-04-12 DIAGNOSIS — M4712 Other spondylosis with myelopathy, cervical region: Secondary | ICD-10-CM | POA: Diagnosis not present

## 2024-04-12 DIAGNOSIS — R200 Anesthesia of skin: Secondary | ICD-10-CM | POA: Diagnosis not present

## 2024-04-18 DIAGNOSIS — M1712 Unilateral primary osteoarthritis, left knee: Secondary | ICD-10-CM | POA: Diagnosis not present

## 2024-04-18 DIAGNOSIS — S46012D Strain of muscle(s) and tendon(s) of the rotator cuff of left shoulder, subsequent encounter: Secondary | ICD-10-CM | POA: Diagnosis not present

## 2024-04-21 DIAGNOSIS — H6993 Unspecified Eustachian tube disorder, bilateral: Secondary | ICD-10-CM | POA: Diagnosis not present

## 2024-04-21 DIAGNOSIS — Z9089 Acquired absence of other organs: Secondary | ICD-10-CM | POA: Diagnosis not present

## 2024-04-21 DIAGNOSIS — H906 Mixed conductive and sensorineural hearing loss, bilateral: Secondary | ICD-10-CM | POA: Diagnosis not present

## 2024-04-21 DIAGNOSIS — R499 Unspecified voice and resonance disorder: Secondary | ICD-10-CM | POA: Diagnosis not present

## 2024-04-21 DIAGNOSIS — J302 Other seasonal allergic rhinitis: Secondary | ICD-10-CM | POA: Diagnosis not present

## 2024-04-21 DIAGNOSIS — H918X3 Other specified hearing loss, bilateral: Secondary | ICD-10-CM | POA: Diagnosis not present

## 2024-04-21 NOTE — Progress Notes (Signed)
 Name: Frank [name] DOB: 1951-06-15 MRN: [id number]  History of Present Illness: Mr. Frank [name] is a 73 y.o. male who presents today for follow up visit at [facility].  GU History includes: 1. BPH with LUTS (frequency & nocturia). - No PSA results found per chart review; patient states that has been normal.  - He denies prior GU surgery. - Family history of prostate cancer (brother). 2. Persistent microscopic hematuria; negative work up. - 06/19/2023: CT abdomen/pelvis w/ contrast showed no evidence of GU malignancy. - 01/14/2024: Cystoscopy by Dr. Inga Manges showed some inflammatory polyps of the prostatic urethral but no other findings of concern. 3. Kidney stones. - 06/19/2023: CT abdomen/pelvis w/ contrast showed bilateral non-obstructive kidney stones.   At last visit on 02/12/2024: - Denied improvement with Gemtesa ; ongoing bothersome nocturia 2-4x/night. Stopped drinking all caffeine.  - The plan was:  1. Restart Flomax .  2. Continue Gemtesa .  Today: He denies improvement of nocturia since restarting Flomax . Otherwise denies bothersome urinary symptoms. Denies caffeine intake.   Medications: Current Outpatient Medications  Medication Sig Dispense Refill   amLODipine  (NORVASC ) 5 MG tablet Take 5 mg by mouth daily.     aspirin EC 81 MG tablet Take 1 tablet (81 mg total) by mouth daily. Swallow whole. 30 tablet 11   chlorthalidone  (HYGROTON ) 25 MG tablet Take 25 mg by mouth daily.     cyclobenzaprine  (FLEXERIL ) 5 MG tablet Take 1 tablet (5 mg total) by mouth 3 (three) times daily as needed for muscle spasms. 50 tablet 0   ezetimibe  (ZETIA ) 10 MG tablet Take 10 mg by mouth at bedtime.     Ginger, Zingiber officinalis, (GINGER PO) Take 1 capsule by mouth daily.     Glucosamine HCl 1000 MG TABS Take 2,000 mg by mouth daily.     losartan  (COZAAR ) 100 MG tablet Take 100 mg by mouth daily.     Misc Natural Products (TART CHERRY ADVANCED PO) Take 1 tablet by mouth daily.      Multiple Vitamin (MULTIVITAMIN WITH MINERALS) TABS tablet Take 1 tablet by mouth daily.     Omega-3 Fatty Acids (FISH OIL) 1000 MG CAPS Take 1,000 mg by mouth daily.     omeprazole (PRILOSEC) 20 MG capsule Take 20 mg by mouth daily.      oxybutynin  (DITROPAN ) 5 MG tablet Take 1 tablet (5 mg total) by mouth at bedtime. 30 tablet 5   pioglitazone  (ACTOS ) 15 MG tablet Take 15 mg by mouth daily.     tamsulosin  (FLOMAX ) 0.4 MG CAPS capsule Take 1 capsule (0.4 mg total) by mouth daily. 30 capsule 2   triamcinolone  cream (KENALOG ) 0.1 % Apply 1 Application topically 2 (two) times daily as needed. 80 g 0   TURMERIC PO Take 1 capsule by mouth daily.     Vibegron  (GEMTESA ) 75 MG TABS Take 1 tablet (75 mg total) by mouth daily.     Vibegron  (GEMTESA ) 75 MG TABS Take 1 tablet (75 mg total) by mouth daily.     azithromycin  (ZITHROMAX ) 250 MG tablet Take first 2 tablets together, then 1 every day until finished. 6 tablet 0   docusate sodium  (COLACE) 100 MG capsule Take 1 capsule (100 mg total) by mouth 2 (two) times daily. 30 capsule 0   methylPREDNISolone  (MEDROL  DOSEPAK) 4 MG TBPK tablet Use as directed on package. 21 tablet 0   oxyCODONE  (ROXICODONE ) 5 MG immediate release tablet Take 1 tablet (5 mg total) by mouth every 4 (four) hours  as needed. 15 tablet 0   promethazine -dextromethorphan (PROMETHAZINE -DM) 6.25-15 MG/5ML syrup Take 5 mLs by mouth 4 (four) times daily as needed. 100 mL 0   No current facility-administered medications for this visit.    Allergies: Allergies  Allergen Reactions   Metformin    Amoxicillin  Rash    Past Medical History:  Diagnosis Date   Arthritis    Barrett's esophagus    BPH (benign prostatic hyperplasia)    Chronic kidney disease    Fracture of 5th metatarsal    GERD (gastroesophageal reflux disease)    Hypercholesteremia    Hypertension    Mastoiditis    Pre-diabetes    Ulcer disease    Bulbar   Past Surgical History:  Procedure Laterality Date    BACK SURGERY     BIOPSY  12/12/2021   Procedure: BIOPSY;  Surgeon: Ruby Corporal, MD;  Location: [facility];  Service: Endoscopy;;   COLONOSCOPY     COLONOSCOPY     COLONOSCOPY N/A 08/27/2016   Procedure: COLONOSCOPY;  Surgeon: Ruby Corporal, MD;  Location: [facility];  Service: Endoscopy;  Laterality: N/A;  930 - moved to 9/27 @ 12:00 - Ann notified pt   COLONOSCOPY N/A 12/12/2021   Procedure: COLONOSCOPY;  Surgeon: Ruby Corporal, MD;  Location: [facility];  Service: Endoscopy;  Laterality: N/A;  730   HEMORRHOID SURGERY  12/02/2007   INGUINAL HERNIA REPAIR Right 10/15/2020   Procedure: OPEN RIGHT INGUINAL HERNIA REPAIR WITH MESH;  Surgeon: Juanita Norlander, MD;  Location[facility];  Service: General;  Laterality: Right;   INSERTION OF MESH Right 10/15/2020   Procedure: INSERTION OF MESH;  Surgeon: Juanita Norlander, MD;  Location: [facility] OR;  Service: General;  Laterality: Right;   KNEE ARTHROSCOPY     Right Knee   LAPAROTOMY N/A 06/20/2023   Procedure: EXPLORATORY LAPAROTOMY DRAIN ABDOMINAL ABSCESS;  Surgeon: Alanda Allegra, MD;  Location: [facility];  Service: General;  Laterality: N/A;   ORIF TOE FRACTURE Left 12/09/2013   Procedure: LEFT OPEN REDUCTION INTERNAL FIXATION (ORIF) FIFTH METATARSAL FRACTURE;  Surgeon: Genevie Kerns, MD;  Location: [facility];  Service: Orthopedics;  Laterality: Left;   POLYPECTOMY  08/27/2016   Procedure: POLYPECTOMY;  Surgeon: Ruby Corporal, MD;  [facility]: [facility];  Service: Endoscopy;;  colon   POLYPECTOMY  12/12/2021   Procedure: POLYPECTOMY;  Surgeon: Ruby Corporal, MD;  Location: [facility];  Service: Endoscopy;;   rotor cuff  12/02/2007   right-   TONSILLECTOMY     TOTAL SHOULDER REPLACEMENT     UPPER GASTROINTESTINAL ENDOSCOPY     Family History  Problem Relation Age of Onset   COPD Mother    Heart disease Father    Healthy Sister    Lung cancer Brother    Heart disease Brother    Heart disease Sister     Prostate cancer Brother    Healthy Son    Healthy Son    Social History   Socioeconomic History   Marital status: Married    Spouse name: Not on file   Number of children: Not on file   Years of education: Not on file   Highest education level: Not on file  Occupational History   Not on file  Tobacco Use   Smoking status: Never   Smokeless tobacco: Never  Vaping Use   Vaping status: Never Used  Substance and Sexual Activity   Alcohol use: No  Drug use: No   Sexual activity: Not on file  Other Topics Concern   Not on file  Social History Narrative   Not on file   Social Drivers of Health   Financial Resource Strain: Not on file  Food Insecurity: Low Risk  (04/21/2024)   Received from [facility]   Hunger Vital Sign    Worried About Running Out of Food in the Last Year: Never true    Ran Out of Food in the Last Year: Never true  Transportation Needs: No Transportation Needs (04/21/2024)   Received from [facility]    In the past 12 months, has lack of reliable transportation kept you from medical appointments, meetings, work or from getting things needed for daily living? : No  Physical Activity: Not on file  Stress: Not on file  Social Connections: Not on file  Intimate Partner Violence: Not on file    Review of Systems Constitutional: Patient denies any unintentional weight loss or change in strength lntegumentary: Patient denies any rashes or pruritus Cardiovascular: Patient denies chest pain or syncope Respiratory: Patient denies shortness of breath Musculoskeletal: Patient denies muscle cramps or weakness Neurologic: Patient denies convulsions or seizures Allergic/Immunologic: Patient denies recent allergic reaction(s) Hematologic/Lymphatic: Patient denies bleeding tendencies Endocrine: Patient denies heat/cold intolerance  GU: As per HPI.  OBJECTIVE Vitals:   04/22/24 1028  BP: 111/76  Pulse: 73   There is no height or weight on  file to calculate BMI.  Physical Examination Constitutional: No obvious distress; patient is non-toxic appearing  Cardiovascular: No visible lower extremity edema.  Respiratory: The patient does not have audible wheezing/stridor; respirations do not appear labored  Gastrointestinal: Abdomen non-distended Musculoskeletal: Normal ROM of UEs  Skin: No obvious rashes/open sores  Neurologic: CN 2-12 grossly intact Psychiatric: Answered questions appropriately with normal affect  Hematologic/Lymphatic/Immunologic: No obvious bruises or sites of spontaneous bleeding  Urine microscopy: 3-10 RBC/hpf, otherwise unremarkable PVR: 0 ml  ASSESSMENT Benign prostatic hyperplasia with urinary frequency - Plan: Urinalysis, Routine w reflex microscopic, BLADDER SCAN AMB NON-IMAGING  Nocturia - Plan: Urinalysis, Routine w reflex microscopic, BLADDER SCAN AMB NON-IMAGING, oxybutynin  (DITROPAN ) 5 MG tablet  Persistent microscopic hematuria - Plan: Urinalysis, Routine w reflex microscopic, BLADDER SCAN AMB NON-IMAGING  Kidney stones - Plan: Urinalysis, Routine w reflex microscopic, BLADDER SCAN AMB NON-IMAGING, DG Abd 1 View  1. BPH: OK to discontinue Flomax .  2. OAB with nocturia: Agreed to continue Gemtesa  and restart Ditropan  (Oxybutynin ) 5 mg nightly.   3. Persistent microscopic hematuria: Stable.  4. Kidney stones: Found incidentally on prior CT. Will plan for KUB for stone surveillance prior to next visit.   Patient verbalized understanding of and agreement with current plan. All questions were answered.  PLAN Advised the following: 1. Continue Gemtesa  (Vibegron ) 75 mg daily. 2. Restart Ditropan  (Oxybutynin ) 5 mg nightly.  3. OK to discontinue Flomax  (Tamsulosin ) 0.4 mg daily. 4. Return in about 6 weeks (around 06/03/2024) for KUB, UA, PVR, & f/u with Griselda Lederer NP.  Orders Placed This Encounter  Procedures   DG Abd 1 View    Standing Status:   Future    Expected Date:   05/30/2024     Expiration Date:   04/22/2025    Reason for Exam (SYMPTOM  OR DIAGNOSIS REQUIRED):   kidney stone    Preferred imaging location?:   [facility]   Urinalysis, Routine w reflex microscopic   BLADDER SCAN AMB NON-IMAGING    It has  been explained that the patient is to follow regularly with their PCP in addition to all other providers involved in their care and to follow instructions provided by these respective offices. Patient advised to contact urology clinic if any urologic-pertaining questions, concerns, new symptoms or problems arise in the interim period.  There are no Patient Instructions on file for this visit.  Electronically signed by:  Lauretta Ponto, FNP   04/22/24    11:07 AM

## 2024-04-22 ENCOUNTER — Ambulatory Visit: Admitting: Urology

## 2024-04-22 ENCOUNTER — Encounter: Payer: Self-pay | Admitting: Urology

## 2024-04-22 ENCOUNTER — Other Ambulatory Visit: Payer: Self-pay | Admitting: Urology

## 2024-04-22 VITALS — BP 111/76 | HR 73

## 2024-04-22 DIAGNOSIS — R351 Nocturia: Secondary | ICD-10-CM | POA: Diagnosis not present

## 2024-04-22 DIAGNOSIS — N401 Enlarged prostate with lower urinary tract symptoms: Secondary | ICD-10-CM

## 2024-04-22 DIAGNOSIS — R350 Frequency of micturition: Secondary | ICD-10-CM

## 2024-04-22 DIAGNOSIS — R3129 Other microscopic hematuria: Secondary | ICD-10-CM | POA: Diagnosis not present

## 2024-04-22 DIAGNOSIS — N200 Calculus of kidney: Secondary | ICD-10-CM

## 2024-04-22 LAB — URINALYSIS, ROUTINE W REFLEX MICROSCOPIC
Bilirubin, UA: NEGATIVE
Glucose, UA: NEGATIVE
Ketones, UA: NEGATIVE
Leukocytes,UA: NEGATIVE
Nitrite, UA: NEGATIVE
Protein,UA: NEGATIVE
Specific Gravity, UA: 1.02 (ref 1.005–1.030)
Urobilinogen, Ur: 0.2 mg/dL (ref 0.2–1.0)
pH, UA: 6 (ref 5.0–7.5)

## 2024-04-22 LAB — MICROSCOPIC EXAMINATION
Bacteria, UA: NONE SEEN
WBC, UA: NONE SEEN /HPF (ref 0–5)

## 2024-04-22 LAB — BLADDER SCAN AMB NON-IMAGING: Scan Result: 0

## 2024-04-22 MED ORDER — OXYBUTYNIN CHLORIDE 5 MG PO TABS: 5.0000 mg | ORAL_TABLET | Freq: Every evening | ORAL | 5 refills | Status: DC

## 2024-04-26 ENCOUNTER — Other Ambulatory Visit: Payer: Self-pay

## 2024-04-26 ENCOUNTER — Encounter: Payer: Self-pay | Admitting: Neurology

## 2024-04-26 DIAGNOSIS — R202 Paresthesia of skin: Secondary | ICD-10-CM

## 2024-05-02 DIAGNOSIS — M1712 Unilateral primary osteoarthritis, left knee: Secondary | ICD-10-CM | POA: Diagnosis not present

## 2024-05-05 DIAGNOSIS — D225 Melanocytic nevi of trunk: Secondary | ICD-10-CM | POA: Diagnosis not present

## 2024-05-05 DIAGNOSIS — L570 Actinic keratosis: Secondary | ICD-10-CM | POA: Diagnosis not present

## 2024-05-05 DIAGNOSIS — L814 Other melanin hyperpigmentation: Secondary | ICD-10-CM | POA: Diagnosis not present

## 2024-05-05 DIAGNOSIS — L219 Seborrheic dermatitis, unspecified: Secondary | ICD-10-CM | POA: Diagnosis not present

## 2024-05-05 DIAGNOSIS — B351 Tinea unguium: Secondary | ICD-10-CM | POA: Diagnosis not present

## 2024-05-05 DIAGNOSIS — L578 Other skin changes due to chronic exposure to nonionizing radiation: Secondary | ICD-10-CM | POA: Diagnosis not present

## 2024-05-05 DIAGNOSIS — L821 Other seborrheic keratosis: Secondary | ICD-10-CM | POA: Diagnosis not present

## 2024-05-05 DIAGNOSIS — D1801 Hemangioma of skin and subcutaneous tissue: Secondary | ICD-10-CM | POA: Diagnosis not present

## 2024-05-20 ENCOUNTER — Telehealth: Payer: Self-pay | Admitting: Urology

## 2024-05-20 NOTE — Telephone Encounter (Signed)
 Needs Gemtesa  samples, he is out and has appt July 9

## 2024-05-20 NOTE — Telephone Encounter (Signed)
 Called pt to let him know we have four boxes of gemtesa  samples up front at the receptionist desk for him pt voiced his understanding

## 2024-05-31 ENCOUNTER — Ambulatory Visit: Admitting: Neurology

## 2024-05-31 DIAGNOSIS — R202 Paresthesia of skin: Secondary | ICD-10-CM | POA: Diagnosis not present

## 2024-05-31 DIAGNOSIS — G5622 Lesion of ulnar nerve, left upper limb: Secondary | ICD-10-CM

## 2024-05-31 DIAGNOSIS — G5621 Lesion of ulnar nerve, right upper limb: Secondary | ICD-10-CM

## 2024-05-31 NOTE — Procedures (Signed)
 [facility]  [address] [place], [address]  [place], [place] [address] Tel: [phone] Fax: [phone] Test Date:  05/31/2024  Patient: Frank [name] DOB: Dec 12, 1950 Physician: Venetia Potters, MD  Sex: Male Height: 5' 11 Ref Phys: Reyes Budge, MD  ID#: [id number]   Technician:    History: This is a 73 year old male with bilateral hand numbness.  NCV & EMG Findings: Extensive electrodiagnostic evaluation of bilateral upper limbs shows: Left ulnar sensory response is absent. Right ulnar sensory response reduced amplitude (3 V). Bilateral median and radial sensory responses are within normal limits. Left ulnar (ADM) motor response shows reduced amplitude (2.3 mV) and decreased conduction velocity across the elbow (34 m/s). Right ulnar (ADM) motor response shows a 23% amplitude drop and decreased conduction velocity across the elbow (33 m/s). Chronic motor axon loss changes WITH accompanying active denervation changes are seen in the left first dorsal interosseous and abductor digiti minimi muscles. Chronic motor axon loss changes WITHOUT accompanying active denervation changes are seen in the right first dorsal interosseous, right abductor digiti minimi, and bilateral flexor digitorum profundus to digits 4, 5 muscles.  Impression: This is an abnormal study. 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 The findings are severe in degree electrically on the left with active/ongoing denervation in the most distal ulnar muscles of the left upper limb. The findings are mild to moderate in degree electrically on the right. No electrodiagnostic evidence of a right or left cervical (C5-C8) motor radiculopathy. No electrodiagnostic evidence of a right or left median mononeuropathy at or distal to the wrist (ie: carpal tunnel syndrome).    ___________________________ Venetia Potters, MD    Nerve Conduction Studies Motor  Nerve Results    Latency Amplitude F-Lat Segment Distance CV Comment  Site (ms) Norm (mV) Norm (ms)  (cm) (m/s) Norm   Left Median (APB) Motor  Wrist 2.8  < 4.0 7.1  > 5.0        Elbow 8.3 - 6.4 -  Elbow-Wrist 29.5 54  > 50   Right Median (APB) Motor  Wrist 3.0  < 4.0 7.7  > 5.0        Elbow 8.3 - 7.5 -  Elbow-Wrist 29 55  > 50   Left Ulnar (ADM) Motor  Wrist 2.8  < 3.1 *2.3  > 7.0        Bel elbow 7.0 - 2.0 -  Bel elbow-Wrist 21.5 51  > 50   Ab elbow 9.9 - 2.0 -  Ab elbow-Bel elbow 10 *34 -   Right Ulnar (ADM) Motor  Wrist 1.88  < 3.1 9.0  > 7.0        Bel elbow 5.9 - 8.2 -  Bel elbow-Wrist 22.5 56  > 50   Ab elbow 8.9 - 6.9 -  Ab elbow-Bel elbow 10 *33 -    Sensory Sites    Neg Peak Lat Amplitude (O-P) Segment Distance Velocity Comment  Site (ms) Norm (V) Norm  (cm) (ms)   Left Median Sensory  Wrist-Dig II 3.4  < 3.8 15  > 10 Wrist-Dig II 13    Right Median Sensory  Wrist 3.4  < 3.8 17  > 10 Wrist-Dig II 13    Left Radial Sensory  Forearm-Wrist 2.2  < 2.8 19  > 10 Forearm-Wrist 10    Right Radial Sensory  Forearm-Wrist 2.3  < 2.8 12  > 10 Forearm-Wrist 10  Left Ulnar Sensory  Wrist-Dig V *NR  < 3.2 *NR  > 5 Wrist-Dig V 11    Right Ulnar Sensory  Wrist-Dig V 3.1  < 3.2 *3  > 5 Wrist-Dig V 11     Electromyography   Side Muscle Ins.Act Fibs Fasc Recrt Amp Dur Poly Activation Comment  Right FDI Nml Nml Nml *1- *1+ *1+ Nml Nml N/A  Right EIP Nml Nml Nml Nml Nml Nml Nml Nml N/A  Right ADM Nml Nml Nml *1- *1+ *1+ Nml Nml N/A  Right Pronator teres Nml Nml Nml Nml Nml Nml Nml Nml N/A  Right FDP 4,5 Nml Nml Nml *1- *1+ *1+ Nml Nml N/A  Right Biceps Nml Nml Nml Nml Nml Nml Nml Nml N/A  Right Triceps Nml Nml Nml Nml Nml Nml Nml Nml N/A  Right Deltoid Nml Nml Nml Nml Nml Nml Nml Nml N/A  Left FDI Nml Nml *1+ *SMU *1- *1+ *1+ Nml *MMAV  Left EIP Nml Nml Nml Nml Nml Nml Nml Nml N/A  Left ADM Nml Nml *1+ *3- *1+ *1+ *1+ Nml *MMAV  Left Pronator teres Nml Nml Nml Nml Nml Nml Nml  Nml N/A  Left FDP 4,5 Nml Nml Nml *1- *1+ *1+ Nml Nml N/A  Left Biceps Nml Nml Nml Nml Nml Nml Nml Nml N/A  Left Triceps Nml Nml Nml Nml Nml Nml Nml Nml N/A  Left Deltoid Nml Nml Nml Nml Nml Nml Nml Nml N/A      Waveforms:  Motor           Sensory

## 2024-06-08 ENCOUNTER — Ambulatory Visit (HOSPITAL_COMMUNITY)
Admission: RE | Admit: 2024-06-08 | Discharge: 2024-06-08 | Disposition: A | Discharge status: Home / Self Care | Source: Ambulatory Visit | Attending: Urology | Admitting: Urology

## 2024-06-08 ENCOUNTER — Encounter: Payer: Self-pay | Admitting: Urology

## 2024-06-08 ENCOUNTER — Ambulatory Visit: Admitting: Urology

## 2024-06-08 VITALS — BP 122/82 | HR 72

## 2024-06-08 DIAGNOSIS — R351 Nocturia: Secondary | ICD-10-CM

## 2024-06-08 DIAGNOSIS — R682 Dry mouth, unspecified: Secondary | ICD-10-CM

## 2024-06-08 DIAGNOSIS — N401 Enlarged prostate with lower urinary tract symptoms: Secondary | ICD-10-CM | POA: Diagnosis not present

## 2024-06-08 DIAGNOSIS — Z981 Arthrodesis status: Secondary | ICD-10-CM | POA: Diagnosis not present

## 2024-06-08 DIAGNOSIS — N200 Calculus of kidney: Secondary | ICD-10-CM | POA: Diagnosis not present

## 2024-06-08 DIAGNOSIS — I878 Other specified disorders of veins: Secondary | ICD-10-CM | POA: Diagnosis not present

## 2024-06-08 DIAGNOSIS — H04129 Dry eye syndrome of unspecified lacrimal gland: Secondary | ICD-10-CM | POA: Diagnosis not present

## 2024-06-08 DIAGNOSIS — N3281 Overactive bladder: Secondary | ICD-10-CM

## 2024-06-08 DIAGNOSIS — R350 Frequency of micturition: Secondary | ICD-10-CM | POA: Diagnosis not present

## 2024-06-08 LAB — MICROSCOPIC EXAMINATION
Bacteria, UA: NONE SEEN
WBC, UA: NONE SEEN /HPF (ref 0–5)

## 2024-06-08 LAB — URINALYSIS, ROUTINE W REFLEX MICROSCOPIC
Bilirubin, UA: NEGATIVE
Glucose, UA: NEGATIVE
Ketones, UA: NEGATIVE
Leukocytes,UA: NEGATIVE
Nitrite, UA: NEGATIVE
Protein,UA: NEGATIVE
Specific Gravity, UA: 1.025 (ref 1.005–1.030)
Urobilinogen, Ur: 0.2 mg/dL (ref 0.2–1.0)
pH, UA: 5.5 (ref 5.0–7.5)

## 2024-06-08 LAB — BLADDER SCAN AMB NON-IMAGING: Scan Result: 3

## 2024-06-08 MED ORDER — GEMTESA 75 MG PO TABS: 1.0000 | ORAL_TABLET | Freq: Every day | ORAL | 11 refills | Status: DC

## 2024-06-08 NOTE — Progress Notes (Signed)
 sc

## 2024-06-08 NOTE — Progress Notes (Signed)
 Name: Frank [name] DOB: [birth year] MRN: [id number]  History of Present Illness: Frank [name] is a 73 y.o. male who presents today for follow up visit at [facility] [place].  Relevant History includes: 1. BPH with LUTS (frequency & nocturia). - No PSA results found per chart review; patient states that has been normal.  - He denies prior GU surgery. - Family history of prostate cancer (brother). 2. Persistent microscopic hematuria; negative work up. - 06/19/2023: CT abdomen/pelvis w/ contrast showed no evidence of GU malignancy. - 01/14/2024: Cystoscopy by Dr. Watt showed some inflammatory polyps of the prostatic urethral but no other findings of concern. 3. Kidney stones. - 06/19/2023: CT abdomen/pelvis w/ contrast showed bilateral non-obstructive kidney stones.  At last visit on 04/22/2024: 1. BPH: OK to discontinue Flomax . 2. OAB with nocturia: Agreed to continue Gemtesa  and restart Ditropan  (Oxybutynin ) 5 mg nightly.  3. Persistent microscopic hematuria: Stable. 4. Kidney stones: Found incidentally on prior CT. Will plan for KUB for stone surveillance prior to next visit.    Since last visit: KUB not obtained by patient.  Today: He reports significant improvement of his nocturia with the combination of both Gemtesa  nightly and Ditropan  5 mg every morning. He states his symptoms are manageable at this point.   He denies urinary urgency, frequency, dysuria, gross hematuria, weak urinary stream, hesitancy, straining to void, or sensations of incomplete emptying.  He denies recent suspected stone migration / passage. He denies acute flank pain / abdominal pain.   Medications: Current Outpatient Medications  Medication Sig Dispense Refill   amLODipine  (NORVASC ) 5 MG tablet Take 5 mg by mouth daily.     aspirin EC 81 MG tablet Take 1 tablet (81 mg total) by mouth daily. Swallow whole. 30 tablet 11   chlorthalidone  (HYGROTON ) 25 MG tablet Take 25 mg by mouth daily.      cyclobenzaprine  (FLEXERIL ) 5 MG tablet Take 1 tablet (5 mg total) by mouth 3 (three) times daily as needed for muscle spasms. 50 tablet 0   ezetimibe  (ZETIA ) 10 MG tablet Take 10 mg by mouth at bedtime.     Ginger, Zingiber officinalis, (GINGER PO) Take 1 capsule by mouth daily.     Glucosamine HCl 1000 MG TABS Take 2,000 mg by mouth daily.     losartan  (COZAAR ) 100 MG tablet Take 100 mg by mouth daily.     Misc Natural Products (TART CHERRY ADVANCED PO) Take 1 tablet by mouth daily.     Multiple Vitamin (MULTIVITAMIN WITH MINERALS) TABS tablet Take 1 tablet by mouth daily.     Omega-3 Fatty Acids (FISH OIL) 1000 MG CAPS Take 1,000 mg by mouth daily.     omeprazole (PRILOSEC) 20 MG capsule Take 20 mg by mouth daily.      oxybutynin  (DITROPAN ) 5 MG tablet Take 1 tablet (5 mg total) by mouth at bedtime. 30 tablet 5   pioglitazone  (ACTOS ) 15 MG tablet Take 15 mg by mouth daily.     tamsulosin  (FLOMAX ) 0.4 MG CAPS capsule Take 1 capsule (0.4 mg total) by mouth daily. 30 capsule 2   triamcinolone  cream (KENALOG ) 0.1 % Apply 1 Application topically 2 (two) times daily as needed. 80 g 0   TURMERIC PO Take 1 capsule by mouth daily.     Vibegron  (GEMTESA ) 75 MG TABS Take 1 tablet (75 mg total) by mouth daily. 30 tablet 11   azithromycin  (ZITHROMAX ) 250 MG tablet Take first 2 tablets together, then 1 every day until  finished. 6 tablet 0   docusate sodium  (COLACE) 100 MG capsule Take 1 capsule (100 mg total) by mouth 2 (two) times daily. 30 capsule 0   No current facility-administered medications for this visit.    Allergies: Allergies  Allergen Reactions   Metformin    Amoxicillin  Rash    Past Medical History:  Diagnosis Date   Arthritis    Barrett's esophagus    BPH (benign prostatic hyperplasia)    Chronic kidney disease    Fracture of 5th metatarsal    GERD (gastroesophageal reflux disease)    Hypercholesteremia    Hypertension    Mastoiditis    Pre-diabetes    Ulcer disease     Bulbar   Past Surgical History:  Procedure Laterality Date   BACK SURGERY     BIOPSY  12/12/2021   Procedure: BIOPSY;  Surgeon: Golda Claudis PENNER, MD;  [facility];  Service: Endoscopy;;   COLONOSCOPY     COLONOSCOPY     COLONOSCOPY N/A 08/27/2016   Procedure: COLONOSCOPY;  Surgeon: Claudis PENNER Golda, MD;  [facility]: [facility];  Service: Endoscopy;  Laterality: N/A;  930 - moved to 9/27 @ 12:00 - Ann notified pt   COLONOSCOPY N/A 12/12/2021   Procedure: COLONOSCOPY;  Surgeon: Golda Claudis PENNER, MD;  [facility];  Service: Endoscopy;  Laterality: N/A;  730   HEMORRHOID SURGERY  12/02/2007   INGUINAL HERNIA REPAIR Right 10/15/2020   Procedure: OPEN RIGHT INGUINAL HERNIA REPAIR WITH MESH;  Surgeon: Ethyl Lenis, MD;  Location: [facility] OR;  Service: General;  Laterality: Right;   INSERTION OF MESH Right 10/15/2020   Procedure: INSERTION OF MESH;  Surgeon: Ethyl Lenis, MD;  Location: [facility] OR;  Service: General;  Laterality: Right;   KNEE ARTHROSCOPY     Right Knee   LAPAROTOMY N/A 06/20/2023   Procedure: EXPLORATORY LAPAROTOMY DRAIN ABDOMINAL ABSCESS;  Surgeon: Mavis Anes, MD;  Location: [facility];  Service: General;  Laterality: N/A;   ORIF TOE FRACTURE Left 12/09/2013   Procedure: LEFT OPEN REDUCTION INTERNAL FIXATION (ORIF) FIFTH METATARSAL FRACTURE;  Surgeon: Lamar DELENA Millman, MD;  Location: [facility];  Service: Orthopedics;  Laterality: Left;   POLYPECTOMY  08/27/2016   Procedure: POLYPECTOMY;  Surgeon: Claudis PENNER Golda, MD;  [facility];  Service: Endoscopy;;  colon   POLYPECTOMY  12/12/2021   Procedure: POLYPECTOMY;  Surgeon: Golda Claudis PENNER, MD;  [facility];  Service: Endoscopy;;   rotor cuff  12/02/2007   right-   TONSILLECTOMY     TOTAL SHOULDER REPLACEMENT     UPPER GASTROINTESTINAL ENDOSCOPY     Family History  Problem Relation Age of Onset   COPD Mother    Heart disease Father    Healthy Sister    Lung cancer  Brother    Heart disease Brother    Heart disease Sister    Prostate cancer Brother    Healthy Son    Healthy Son    Social History   Socioeconomic History   Marital status: Married    Spouse name: Not on file   Number of children: Not on file   Years of education: Not on file   Highest education level: Not on file  Occupational History   Not on file  Tobacco Use   Smoking status: Never   Smokeless tobacco: Never  Vaping Use   Vaping status: Never Used  Substance and Sexual Activity   Alcohol use: No  Drug use: No   Sexual activity: Not on file  Other Topics Concern   Not on file  Social History Narrative   Not on file   Social Drivers of Health   Financial Resource Strain: Not on file  Food Insecurity: Low Risk  (04/21/2024)   Received from [facility]   Hunger Vital Sign    Within the past 12 months, you worried that your food would run out before you got money to buy more: Never true    Within the past 12 months, the food you bought just didn't last and you didn't have money to get more. : Never true  Transportation Needs: No Transportation Needs (04/21/2024)   Received from [facility]    In the past 12 months, has lack of reliable transportation kept you from medical appointments, meetings, work or from getting things needed for daily living? : No  Physical Activity: Not on file  Stress: Not on file  Social Connections: Not on file  Intimate Partner Violence: Not on file    Review of Systems Constitutional: Patient denies any unintentional weight loss or change in strength lntegumentary: Patient denies any rashes or pruritus Cardiovascular: Patient denies chest pain or syncope Respiratory: Patient denies shortness of breath ENT: Reports dry eyes Gastrointestinal: Reports dry mouth; denies constipation  Musculoskeletal: Patient denies muscle cramps or weakness Neurologic: Patient denies convulsions or seizures Allergic/Immunologic:  Patient denies recent allergic reaction(s) Hematologic/Lymphatic: Patient denies bleeding tendencies Endocrine: Patient denies heat/cold intolerance  GU: As per HPI.  OBJECTIVE Vitals:   06/08/24 1142  BP: 122/82  Pulse: 72   There is no height or weight on file to calculate BMI.  Physical Examination Constitutional: No obvious distress; patient is non-toxic appearing  Cardiovascular: No visible lower extremity edema.  Respiratory: The patient does not have audible wheezing/stridor; respirations do not appear labored  Gastrointestinal: Abdomen non-distended Musculoskeletal: Normal ROM of UEs  Skin: No obvious rashes/open sores  Neurologic: CN 2-12 grossly intact Psychiatric: Answered questions appropriately with normal affect  Hematologic/Lymphatic/Immunologic: No obvious bruises or sites of spontaneous bleeding  Urine microscopy: 3-10 RBC/hpf, otherwise unremarkable PVR: 3 ml  ASSESSMENT Benign prostatic hyperplasia with urinary frequency - Plan: Urinalysis, Routine w reflex microscopic, BLADDER SCAN AMB NON-IMAGING  OAB (overactive bladder) - Plan: Vibegron  (GEMTESA ) 75 MG TABS  Nocturia - Plan: Vibegron  (GEMTESA ) 75 MG TABS  Dry eye  Dry mouth  Kidney stones  Symptoms well managed at this time. We agreed to continue current treatment regimen and he was advised to get KUB for stone surveillance as previously discussed. We agreed to plan for follow up in 6 months or sooner if needed. Patient verbalized understanding of and agreement with current plan. All questions were answered.  PLAN Advised the following: 1. KUB. 2. Continue Gemtesa  (Vibegron ) 75 mg daily. 3. Continue Ditropan  (Oxybutynin ) 5 mg daily.  4. Continue to minimize / avoid caffeine intake. 5. Return in about 6 months (around 12/09/2024) for BPH, OAB, with UA & PVR.  Orders Placed This Encounter  Procedures   Urinalysis, Routine w reflex microscopic   BLADDER SCAN AMB NON-IMAGING    It has been  explained that the patient is to follow regularly with their PCP in addition to all other providers involved in their care and to follow instructions provided by these respective offices. Patient advised to contact urology clinic if any urologic-pertaining questions, concerns, new symptoms or problems arise in the interim period.  There are no Patient Instructions  on file for this visit.  Electronically signed by:  Lauraine JAYSON Oz, FNP   06/08/24    12:21 PM

## 2024-06-13 ENCOUNTER — Ambulatory Visit: Payer: Self-pay | Admitting: Urology

## 2024-06-16 ENCOUNTER — Telehealth: Payer: Self-pay

## 2024-06-16 DIAGNOSIS — G5622 Lesion of ulnar nerve, left upper limb: Secondary | ICD-10-CM | POA: Diagnosis not present

## 2024-06-16 NOTE — Telephone Encounter (Signed)
 Patient's cost for  Vibegron  (GEMTESA ) 75 MG TABS  is expensive.  Requesting an off brand or another Rx less expensive.  Please advise.

## 2024-06-16 NOTE — Telephone Encounter (Signed)
 Tried calling pt with no answer, left vm for return call.

## 2024-06-16 NOTE — Telephone Encounter (Signed)
 Patient is made aware of NP  recommendation  Only alternative in the same drug class (beta 3 adrenergic agonists) would be Myrbetriq (Mirabegron) which may be similar in cost. He is not a safe candidate for the alternative drug class (anticholinergic / antimuscarinic medications) due to risk for side effects based on patient's age, comorbidities, and pre-existing dry mouth and dry eyes.  Pt state's he will call back after talking with his wife on the alternative.

## 2024-06-27 DIAGNOSIS — G5622 Lesion of ulnar nerve, left upper limb: Secondary | ICD-10-CM | POA: Diagnosis not present

## 2024-06-28 ENCOUNTER — Telehealth: Payer: Self-pay

## 2024-06-28 NOTE — Telephone Encounter (Signed)
 Patient was made aware and voiced understanding X- ray was normal - no stones visualized.

## 2024-07-08 DIAGNOSIS — Z6828 Body mass index (BMI) 28.0-28.9, adult: Secondary | ICD-10-CM | POA: Diagnosis not present

## 2024-07-08 DIAGNOSIS — M4316 Spondylolisthesis, lumbar region: Secondary | ICD-10-CM | POA: Diagnosis not present

## 2024-07-08 DIAGNOSIS — G5622 Lesion of ulnar nerve, left upper limb: Secondary | ICD-10-CM | POA: Diagnosis not present

## 2024-07-15 ENCOUNTER — Encounter (HOSPITAL_COMMUNITY): Payer: Self-pay | Admitting: Occupational Therapy

## 2024-07-15 ENCOUNTER — Ambulatory Visit (HOSPITAL_COMMUNITY): Attending: Neurosurgery | Admitting: Occupational Therapy

## 2024-07-15 ENCOUNTER — Other Ambulatory Visit: Payer: Self-pay

## 2024-07-15 DIAGNOSIS — R29818 Other symptoms and signs involving the nervous system: Secondary | ICD-10-CM | POA: Diagnosis not present

## 2024-07-15 DIAGNOSIS — M25522 Pain in left elbow: Secondary | ICD-10-CM | POA: Insufficient documentation

## 2024-07-15 NOTE — Therapy (Signed)
 OUTPATIENT OCCUPATIONAL THERAPY ORTHO EVALUATION  Patient Name: Frank [name] MRN: [id number] DOB:1951-05-02, 73 y.o., male Today's Date: 07/15/2024    END OF SESSION:  OT End of Session - 07/15/24 1145     Visit Number 1    Number of Visits 5    Date for OT Re-Evaluation 08/14/24    Authorization Type [employer], $15 copay    Authorization Time Period no auth no limit    Progress Note Due on Visit 10    OT Start Time 1101    OT Stop Time 1131    OT Time Calculation (min) 30 min    Activity Tolerance Patient tolerated treatment well    Behavior During Therapy WFL for tasks assessed/performed          Past Medical History:  Diagnosis Date   Arthritis    Barrett's esophagus    BPH (benign prostatic hyperplasia)    Chronic kidney disease    Fracture of 5th metatarsal    GERD (gastroesophageal reflux disease)    Hypercholesteremia    Hypertension    Mastoiditis    Pre-diabetes    Ulcer disease    Bulbar   Past Surgical History:  Procedure Laterality Date   BACK SURGERY     BIOPSY  12/12/2021   Procedure: BIOPSY;  Surgeon: Golda Claudis PENNER, MD;  [facility];  Service: Endoscopy;;   COLONOSCOPY     COLONOSCOPY     COLONOSCOPY N/A 08/27/2016   Procedure: COLONOSCOPY;  Surgeon: Claudis PENNER Golda, MD;  [facility];  Service: Endoscopy;  Laterality: N/A;  930 - moved to 9/27 @ 12:00 - Ann notified pt   COLONOSCOPY N/A 12/12/2021   Procedure: COLONOSCOPY;  Surgeon: Golda Claudis PENNER, MD;  [facility];  Service: Endoscopy;  Laterality: N/A;  730   HEMORRHOID SURGERY  12/02/2007   INGUINAL HERNIA REPAIR Right 10/15/2020   Procedure: OPEN RIGHT INGUINAL HERNIA REPAIR WITH MESH;  Surgeon: Ethyl Lenis, MD;  Location: [facility] OR;  Service: General;  Laterality: Right;   INSERTION OF MESH Right 10/15/2020   Procedure: INSERTION OF MESH;  Surgeon: Ethyl Lenis, MD;  Location: [facility] OR;  Service: General;  Laterality: Right;   KNEE  ARTHROSCOPY     Right Knee   LAPAROTOMY N/A 06/20/2023   Procedure: EXPLORATORY LAPAROTOMY DRAIN ABDOMINAL ABSCESS;  Surgeon: Mavis Anes, MD;  Location: [facility];  Service: General;  Laterality: N/A;   ORIF TOE FRACTURE Left 12/09/2013   Procedure: LEFT OPEN REDUCTION INTERNAL FIXATION (ORIF) FIFTH METATARSAL FRACTURE;  Surgeon: Lamar DELENA Millman, MD;  Location: [facility];  Service: Orthopedics;  Laterality: Left;   POLYPECTOMY  08/27/2016   Procedure: POLYPECTOMY;  Surgeon: Claudis PENNER Golda, MD;  [facility];  Service: Endoscopy;;  colon   POLYPECTOMY  12/12/2021   Procedure: POLYPECTOMY;  Surgeon: Golda Claudis PENNER, MD;  [facility];  Service: Endoscopy;;   rotor cuff  12/02/2007   right-   TONSILLECTOMY     TOTAL SHOULDER REPLACEMENT     UPPER GASTROINTESTINAL ENDOSCOPY     Patient Active Problem List   Diagnosis Date Noted   Dry eye 06/08/2024   Dry mouth 06/08/2024   OAB (overactive bladder) 06/08/2024   Persistent microscopic hematuria 02/12/2024   Nocturia 02/12/2024   Actinic keratosis 11/06/2023   Central perforation of tympanic membrane of left ear 11/06/2023   Dysfunction of both eustachian tubes 11/06/2023   Kidney stones 11/06/2023  Sinus bradycardia 06/20/2023   Hypokalemia 06/20/2023   Spondylolisthesis of lumbar region 06/11/2023   Arthropathy of lumbar facet joint 04/02/2023   History of bilateral mastoidectomy 09/19/2021   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 Gaither Langton  REFERRING PROVIDER: Dr. Reyes Budge  ONSET DATE: ~06/27/24  REFERRING DIAG: H43.77 (ICD-10-CM) - Lesion of ulnar nerve, left upper limb    THERAPY DIAG:  Pain in left elbow  Other symptoms and signs involving the nervous system  Rationale for Evaluation and Treatment: Rehabilitation  SUBJECTIVE:   SUBJECTIVE STATEMENT: S: I was hoping for immediate relief but it didn't happen Pt accompanied by: self  PERTINENT HISTORY: Pt is a 73 y/o male s/p left cubital tunnel release approximately 2 weeks ago. Pt has had sensation issues for 2+ years, originally thought to be secondary to cervical neck issues, however after EMG it was determined he had an ulnar nerve issue.   PRECAUTIONS: None  WEIGHT BEARING RESTRICTIONS: No  PAIN:  Are you having pain? Yes: NPRS scale: 1/10 Pain location: left elbow Pain description: numbness Aggravating factors: nothing Relieving factors: nothing  FALLS: Has patient fallen in last 6 months? No  PLOF: Independent  PATIENT GOALS: To have better sensation and less pain.    OBJECTIVE:  Note: Objective measures were completed at Evaluation unless otherwise noted.  HAND DOMINANCE: Right  ADLs: Pt reports numbness in his left hand, has difficulty with cutting food, manipulating the blinds in window. Pt has difficulty with lifting heavy and moving items.   FUNCTIONAL OUTCOME MEASURES: QUICK DASH  Please rate your ability do the following activities in the last week by selecting the number below the appropriate response.   Activities Rating  Open a tight or new jar.  3 = Moderate difficulty  Do heavy household chores (e.g., wash walls, floors). 2 = Mild difficulty  Carry a shopping bag or briefcase 2 = Mild difficulty  Wash your back. 1 = No difficulty   Use a knife to cut food. 3 = Moderate difficulty  Recreational activities in which you take some force or impact through your arm, shoulder or hand (e.g., golf, hammering, tennis, etc.). 2 = Mild difficulty  During the past week, to what extent has your arm, shoulder or hand problem interfered with your normal social activities  with family, friends, neighbors or groups?  1 = Not at all  During the past week, were you limited in your work or other regular daily activities as a result of your arm, shoulder or hand problem? 1 = Not limited at all  Rate the severity of the following symptoms in the last week: Arm, Shoulder, or hand pain. 2 = Mild  Rate the severity of the following symptoms in the last week: Tingling (pins and needles) in your arm, shoulder or hand. 2 = Mild  During the past week, how much difficulty have you had sleeping because of the pain in your arm, shoulder or hand?  1 = No difficulty   (A QuickDASH score may not be calculated if there is greater than 1 missing item.)  Quick Dash Disability/Symptom Score: [(sum of 20 (n) responses/11 (n)] x 25 = 45.45  Minimally Clinically Important Difference (MCID): 15-20 points  (Franchignoni, F. et al. (2013). Minimally clinically important difference of the disabilities of the arm, shoulder, and hand outcome measures (DASH) and its shortened version (Quick DASH). Journal of Orthopaedic & Sports Physical Therapy, 44(1), 30-39)   UPPER EXTREMITY ROM:     Active ROM Left eval  Elbow flexion 144  Elbow extension 0  (Blank rows = not tested)   UPPER EXTREMITY MMT:     MMT Left eval  Elbow flexion 5/5  Elbow extension 5/5  Wrist flexion 5/5  Wrist extension 5/5  Wrist ulnar deviation 5/5  Wrist radial deviation 5/5  Wrist pronation 5/5  Wrist supination 5/5  (Blank rows = not tested)  HAND FUNCTION: Grip strength: Right: 60 lbs; Left: 50 lbs, Lateral pinch: Right: 17 lbs, Left: 7 lbs, and 3 point pinch: Right: 9 lbs, Left: 5 lbs  COORDINATION: 9 Hole Peg test: Right: 19.85 sec; Left: 25.30 sec  SENSATION: Light touch intact  COGNITION: Overall cognitive status: Within functional limits for tasks assessed  OBSERVATIONS: muscle atrophy at the thenar eminence   TREATMENT DATE:  Eval -Educated on HEP; discussed POC                                                                                                                               PATIENT EDUCATION: Education details: red theraputty grip/pinch strengthening Person educated: Patient Education method: Explanation, Demonstration, and Handouts Education comprehension: verbalized understanding and returned demonstration  HOME EXERCISE PROGRAM: Eval: red theraputty grip/pinch strengthening  GOALS: Goals reviewed with patient? Yes  SHORT TERM GOALS: Target date: 08/15/24  Pt will be provided with and educated on HEP to improve functional use of the LUE during ADLs.   Goal status: INITIAL  2.  Pt will be educated on splint wear and care required for successful splint use.   Goal status: INITIAL  3.  Pt will increase left grip strength by 5# and pinch strength by 3# to improve ability to grasp and move items during functional tasks.   Goal status: INITIAL     ASSESSMENT:  CLINICAL IMPRESSION: Patient is a 73 y.o. male who was seen today for occupational therapy evaluation s/p left cubital tunnel release approximately 2 weeks ago. Pt reports he is not having much pain, however feels numbness and tingling along the ulnar nerve distribution. Also noting muscle atrophy along the left thenar eminence and decreased grip/pinch strength. Educated on plan of care and reviewed HEP.   PERFORMANCE DEFICITS: in functional skills including ADLs, IADLs, sensation, strength, pain, fascial restrictions, and UE functional use  IMPAIRMENTS: are limiting patient from ADLs, IADLs, rest and sleep, and leisure.   COMORBIDITIES: has no other co-morbidities that affects occupational performance. Patient will benefit from skilled OT to address above impairments and improve overall function.  MODIFICATION OR ASSISTANCE TO COMPLETE EVALUATION: No modification of tasks or assist necessary to complete an evaluation.  OT OCCUPATIONAL PROFILE AND  HISTORY: Problem focused assessment:  Including review of records relating to presenting problem.  CLINICAL DECISION MAKING: LOW - limited treatment options, no task modification necessary  REHAB POTENTIAL: Good  EVALUATION COMPLEXITY: Low      PLAN:  OT FREQUENCY: 1x/week  OT DURATION: 4 weeks  PLANNED INTERVENTIONS: 97168 OT Re-evaluation, 97535 self care/ADL training, 02889 therapeutic exercise, 97530 therapeutic activity, 97112 neuromuscular re-education, 97032 electrical stimulation (manual), [id number] Orthotic Initial, patient/family education, and DME and/or AE instructions  RECOMMENDED OTHER SERVICES: None at this time  CONSULTED AND AGREED WITH PLAN OF CARE: Patient  PLAN FOR NEXT SESSION: Fabricate nighttime splint for elbow extension, weightbearing, TENS, grip/pinch strengthening   Sonny Cory, OTR/L  [phone] 07/15/2024, 11:46 AM

## 2024-07-15 NOTE — Patient Instructions (Signed)

## 2024-07-19 ENCOUNTER — Encounter (HOSPITAL_COMMUNITY): Admitting: Occupational Therapy

## 2024-07-19 DIAGNOSIS — M47816 Spondylosis without myelopathy or radiculopathy, lumbar region: Secondary | ICD-10-CM | POA: Diagnosis not present

## 2024-07-19 DIAGNOSIS — M4316 Spondylolisthesis, lumbar region: Secondary | ICD-10-CM | POA: Diagnosis not present

## 2024-07-19 DIAGNOSIS — M47817 Spondylosis without myelopathy or radiculopathy, lumbosacral region: Secondary | ICD-10-CM | POA: Diagnosis not present

## 2024-07-19 DIAGNOSIS — Z6828 Body mass index (BMI) 28.0-28.9, adult: Secondary | ICD-10-CM | POA: Diagnosis not present

## 2024-07-19 DIAGNOSIS — M5126 Other intervertebral disc displacement, lumbar region: Secondary | ICD-10-CM | POA: Diagnosis not present

## 2024-07-19 DIAGNOSIS — M48062 Spinal stenosis, lumbar region with neurogenic claudication: Secondary | ICD-10-CM | POA: Diagnosis not present

## 2024-07-19 DIAGNOSIS — M5137 Other intervertebral disc degeneration, lumbosacral region with discogenic back pain only: Secondary | ICD-10-CM | POA: Diagnosis not present

## 2024-07-21 DIAGNOSIS — I10 Essential (primary) hypertension: Secondary | ICD-10-CM | POA: Diagnosis not present

## 2024-07-21 DIAGNOSIS — N400 Enlarged prostate without lower urinary tract symptoms: Secondary | ICD-10-CM | POA: Diagnosis not present

## 2024-07-21 DIAGNOSIS — K219 Gastro-esophageal reflux disease without esophagitis: Secondary | ICD-10-CM | POA: Diagnosis not present

## 2024-07-21 DIAGNOSIS — Z79899 Other long term (current) drug therapy: Secondary | ICD-10-CM | POA: Diagnosis not present

## 2024-07-21 DIAGNOSIS — N1831 Chronic kidney disease, stage 3a: Secondary | ICD-10-CM | POA: Diagnosis not present

## 2024-07-21 DIAGNOSIS — I700 Atherosclerosis of aorta: Secondary | ICD-10-CM | POA: Diagnosis not present

## 2024-07-21 DIAGNOSIS — E785 Hyperlipidemia, unspecified: Secondary | ICD-10-CM | POA: Diagnosis not present

## 2024-07-21 DIAGNOSIS — Z125 Encounter for screening for malignant neoplasm of prostate: Secondary | ICD-10-CM | POA: Diagnosis not present

## 2024-07-21 DIAGNOSIS — E1129 Type 2 diabetes mellitus with other diabetic kidney complication: Secondary | ICD-10-CM | POA: Diagnosis not present

## 2024-07-26 ENCOUNTER — Ambulatory Visit (HOSPITAL_COMMUNITY): Admitting: Occupational Therapy

## 2024-07-26 ENCOUNTER — Encounter (HOSPITAL_COMMUNITY): Payer: Self-pay | Admitting: Occupational Therapy

## 2024-07-26 DIAGNOSIS — M25522 Pain in left elbow: Secondary | ICD-10-CM

## 2024-07-26 DIAGNOSIS — R29818 Other symptoms and signs involving the nervous system: Secondary | ICD-10-CM

## 2024-07-26 NOTE — Therapy (Unsigned)
 OUTPATIENT OCCUPATIONAL THERAPY ORTHO TREATMENT NOTE  Patient Name: Frank [name] MRN: [id number] DOB:Jun 27, 1951, 73 y.o., male Today's Date: 07/27/2024    END OF SESSION:  OT End of Session - 07/26/24 1208     Visit Number 2    Number of Visits 5    Date for OT Re-Evaluation 08/14/24    Authorization Type [employer], $15 copay    Authorization Time Period no auth no limit    Progress Note Due on Visit 10    OT Start Time 1124    OT Stop Time 1208    OT Time Calculation (min) 44 min    Activity Tolerance Patient tolerated treatment well    Behavior During Therapy WFL for tasks assessed/performed           Past Medical History:  Diagnosis Date   Arthritis    Barrett's esophagus    BPH (benign prostatic hyperplasia)    Chronic kidney disease    Fracture of 5th metatarsal    GERD (gastroesophageal reflux disease)    Hypercholesteremia    Hypertension    Mastoiditis    Pre-diabetes    Ulcer disease    Bulbar   Past Surgical History:  Procedure Laterality Date   BACK SURGERY     BIOPSY  12/12/2021   Procedure: BIOPSY;  Surgeon: Golda Claudis PENNER, MD;  [facility]: [facility];  Service: Endoscopy;;   COLONOSCOPY     COLONOSCOPY     COLONOSCOPY N/A 08/27/2016   Procedure: COLONOSCOPY;  Surgeon: Claudis PENNER Golda, MD;  [facility]: [facility];  Service: Endoscopy;  Laterality: N/A;  930 - moved to 9/27 @ 12:00 - Ann notified pt   COLONOSCOPY N/A 12/12/2021   Procedure: COLONOSCOPY;  Surgeon: Golda Claudis PENNER, MD;  [facility]: [facility];  Service: Endoscopy;  Laterality: N/A;  730   HEMORRHOID SURGERY  12/02/2007   INGUINAL HERNIA REPAIR Right 10/15/2020   Procedure: OPEN RIGHT INGUINAL HERNIA REPAIR WITH MESH;  Surgeon: Ethyl Lenis, MD;  Location: [facility] OR;  Service: General;  Laterality: Right;   INSERTION OF MESH Right 10/15/2020   Procedure: INSERTION OF MESH;  Surgeon: Ethyl Lenis, MD;  Location: [facility] OR;  Service: General;  Laterality: Right;   KNEE  ARTHROSCOPY     Right Knee   LAPAROTOMY N/A 06/20/2023   Procedure: EXPLORATORY LAPAROTOMY DRAIN ABDOMINAL ABSCESS;  Surgeon: Mavis Anes, MD;  Location: [facility];  Service: General;  Laterality: N/A;   ORIF TOE FRACTURE Left 12/09/2013   Procedure: LEFT OPEN REDUCTION INTERNAL FIXATION (ORIF) FIFTH METATARSAL FRACTURE;  Surgeon: Lamar DELENA Millman, MD;  Location: [facility];  Service: Orthopedics;  Laterality: Left;   POLYPECTOMY  08/27/2016   Procedure: POLYPECTOMY;  Surgeon: Claudis PENNER Golda, MD;  [facility];  Service: Endoscopy;;  colon   POLYPECTOMY  12/12/2021   Procedure: POLYPECTOMY;  Surgeon: Golda Claudis PENNER, MD;  [facility]: [facility];  Service: Endoscopy;;   rotor cuff  12/02/2007   right-   TONSILLECTOMY     TOTAL SHOULDER REPLACEMENT     UPPER GASTROINTESTINAL ENDOSCOPY     Patient Active Problem List   Diagnosis Date Noted   Dry eye 06/08/2024   Dry mouth 06/08/2024   OAB (overactive bladder) 06/08/2024   Persistent microscopic hematuria 02/12/2024   Nocturia 02/12/2024   Actinic keratosis 11/06/2023   Central perforation of tympanic membrane of left ear 11/06/2023   Dysfunction of both eustachian tubes 11/06/2023   Kidney stones  11/06/2023   Sinus bradycardia 06/20/2023   Hypokalemia 06/20/2023   Spondylolisthesis of lumbar region 06/11/2023   Arthropathy of lumbar facet joint 04/02/2023   History of bilateral mastoidectomy 09/19/2021   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 Gaither Langton  REFERRING PROVIDER: Dr. Reyes Budge  ONSET DATE: ~06/27/24  REFERRING DIAG: H43.77 (ICD-10-CM) - Lesion of ulnar nerve, left upper limb    THERAPY DIAG:  Pain in left elbow  Other symptoms and signs involving the nervous system  Rationale for Evaluation and Treatment: Rehabilitation  SUBJECTIVE:   SUBJECTIVE STATEMENT: S: I don't think that surgery did any good Pt accompanied by: self  PERTINENT HISTORY: Pt is a 73 y/o male s/p left cubital tunnel release approximately 2 weeks ago. Pt has had sensation issues for 2+ years, originally thought to be secondary to cervical neck issues, however after EMG it was determined he had an ulnar nerve issue.   PRECAUTIONS: None  WEIGHT BEARING RESTRICTIONS: No  PAIN:  Are you having pain? Yes: NPRS scale: 1/10 Pain location: left elbow Pain description: numbness Aggravating factors: nothing Relieving factors: nothing  FALLS: Has patient fallen in last 6 months? No  PLOF: Independent  PATIENT GOALS: To have better sensation and less pain.    OBJECTIVE:  Note: Objective measures were completed at Evaluation unless otherwise noted.  HAND DOMINANCE: Right  ADLs: Pt reports numbness in his left hand, has difficulty with cutting food, manipulating the blinds in window. Pt has difficulty with lifting heavy and moving items.   FUNCTIONAL OUTCOME MEASURES: QUICK DASH  Please rate your ability do the following activities in the last week by selecting the number below the appropriate response.   Activities Rating  Open a tight or new jar.  3 = Moderate difficulty  Do heavy household chores (e.g., wash walls, floors). 2 = Mild difficulty  Carry a shopping bag or briefcase 2 = Mild difficulty  Wash your back. 1 = No difficulty   Use a knife to cut food. 3 = Moderate difficulty  Recreational activities in which you take some force or impact through your arm, shoulder or hand (e.g., golf, hammering, tennis, etc.). 2 = Mild difficulty  During the past week, to what extent has your arm, shoulder or hand problem interfered with your normal social activities with family,  friends, neighbors or groups?  1 = Not at all  During the past week, were you limited in your work or other regular daily activities as a result of your arm, shoulder or hand problem? 1 = Not limited at all  Rate the severity of the following symptoms in the last week: Arm, Shoulder, or hand pain. 2 = Mild  Rate the severity of the following symptoms in the last week: Tingling (pins and needles) in your arm, shoulder or hand. 2 = Mild  During the past week, how much difficulty have you had sleeping because of the pain in your arm, shoulder or hand?  1 = No difficulty   (A QuickDASH score may not be calculated if there is greater than 1 missing item.)  Quick Dash Disability/Symptom Score: [(sum of 20 (n) responses/11 (n)] x 25 = 45.45  Minimally Clinically Important Difference (MCID): 15-20 points  (Franchignoni, F. et al. (2013). Minimally clinically important difference of the disabilities of the arm, shoulder, and hand outcome measures (DASH) and its shortened version (Quick DASH). Journal of Orthopaedic & Sports Physical Therapy, 44(1), 30-39)   UPPER EXTREMITY ROM:     Active ROM Left eval  Elbow flexion 144  Elbow extension 0  (Blank rows = not tested)   UPPER EXTREMITY MMT:     MMT Left eval  Elbow flexion 5/5  Elbow extension 5/5  Wrist flexion 5/5  Wrist extension 5/5  Wrist ulnar deviation 5/5  Wrist radial deviation 5/5  Wrist pronation 5/5  Wrist supination 5/5  (Blank rows = not tested)  HAND FUNCTION: Grip strength: Right: 60 lbs; Left: 50 lbs, Lateral pinch: Right: 17 lbs, Left: 7 lbs, and 3 point pinch: Right: 9 lbs, Left: 5 lbs  COORDINATION: 9 Hole Peg test: Right: 19.85 sec; Left: 25.30 sec  SENSATION: Light touch intact  COGNITION: Overall cognitive status: Within functional limits for tasks assessed  OBSERVATIONS: muscle atrophy at the thenar eminence   TREATMENT DATE:   07/26/24 -Discussed splint types and wear schedule - he elected to  purchase a prefabricated splint to try first -Discussed sleeping positions to limit compression on ulnar nerve -Therabar: teal, extension/flexion twists, supination/pronation bends, ulnar/radial bends, x12 -Gripper: 37# 10 medium beads, 5 large beads, 55# 8 medium beads -Cube stacking, green resistance clip, 4 stacks of 6 cubes, unstacking with red resistance clip  Eval -Educated on HEP; discussed POC                                                                                                                              PATIENT EDUCATION: Education details: reviewed elbow splints and wear schedule Person educated: Patient Education method: Programmer, multimedia, Facilities manager, and Handouts Education comprehension: verbalized understanding and returned demonstration  HOME EXERCISE PROGRAM: Eval: red theraputty grip/pinch strengthening  GOALS: Goals reviewed with patient? Yes  SHORT TERM GOALS: Target date: 08/15/24  Pt will be provided with and educated on HEP to improve functional use of the LUE during ADLs.   Goal status: IN PROGRESS  2.  Pt will be educated on splint wear and care required for successful splint use.   Goal status: IN PROGRESS  3.  Pt will increase left grip strength by 5# and pinch strength by 3# to improve ability to grasp and move items during functional tasks.   Goal status: IN PROGRESS     ASSESSMENT:  CLINICAL IMPRESSION: Pt was seen for his first OT treatment session where he reported no pain only numbness. He tolerated light strengthening and gripping well. Moderate fatigue noted with increased tasks and exercises. Verbal and tactile cuing provided for positioning and technique.   PERFORMANCE DEFICITS: in functional skills including ADLs, IADLs, sensation, strength, pain, fascial restrictions, and UE functional use    PLAN:  OT FREQUENCY: 1x/week  OT DURATION: 4 weeks  PLANNED INTERVENTIONS: 97168 OT Re-evaluation, 97535 self care/ADL  training, 02889 therapeutic exercise, 97530 therapeutic activity, 97112 neuromuscular re-education, [phone] electrical stimulation (manual), [id number] Orthotic Initial, patient/family education, and DME and/or AE instructions  RECOMMENDED OTHER SERVICES: None at this time  CONSULTED AND AGREED WITH PLAN OF CARE: Patient  PLAN FOR NEXT SESSION: Fabricate nighttime splint for elbow extension, weightbearing, TENS, grip/pinch strengthening   Valentin Nightingale, OTR/L [phone] 07/27/2024, 1:11 PM

## 2024-07-28 DIAGNOSIS — I10 Essential (primary) hypertension: Secondary | ICD-10-CM | POA: Diagnosis not present

## 2024-07-28 DIAGNOSIS — M791 Myalgia, unspecified site: Secondary | ICD-10-CM | POA: Diagnosis not present

## 2024-07-28 DIAGNOSIS — T466X5A Adverse effect of antihyperlipidemic and antiarteriosclerotic drugs, initial encounter: Secondary | ICD-10-CM | POA: Diagnosis not present

## 2024-07-28 DIAGNOSIS — E876 Hypokalemia: Secondary | ICD-10-CM | POA: Diagnosis not present

## 2024-07-28 DIAGNOSIS — E1122 Type 2 diabetes mellitus with diabetic chronic kidney disease: Secondary | ICD-10-CM | POA: Diagnosis not present

## 2024-07-28 DIAGNOSIS — E785 Hyperlipidemia, unspecified: Secondary | ICD-10-CM | POA: Diagnosis not present

## 2024-07-28 DIAGNOSIS — Z79899 Other long term (current) drug therapy: Secondary | ICD-10-CM | POA: Diagnosis not present

## 2024-07-28 DIAGNOSIS — H00011 Hordeolum externum right upper eyelid: Secondary | ICD-10-CM | POA: Diagnosis not present

## 2024-07-28 DIAGNOSIS — Z0001 Encounter for general adult medical examination with abnormal findings: Secondary | ICD-10-CM | POA: Diagnosis not present

## 2024-08-02 ENCOUNTER — Ambulatory Visit (HOSPITAL_COMMUNITY): Attending: Neurosurgery | Admitting: Occupational Therapy

## 2024-08-02 ENCOUNTER — Encounter (HOSPITAL_COMMUNITY): Payer: Self-pay | Admitting: Occupational Therapy

## 2024-08-02 DIAGNOSIS — M25522 Pain in left elbow: Secondary | ICD-10-CM | POA: Insufficient documentation

## 2024-08-02 DIAGNOSIS — R29818 Other symptoms and signs involving the nervous system: Secondary | ICD-10-CM | POA: Insufficient documentation

## 2024-08-02 NOTE — Therapy (Signed)
 OUTPATIENT OCCUPATIONAL THERAPY ORTHO TREATMENT NOTE  Patient Name: Frank [name] MRN: [id number] DOB:08/18/1951, 73 y.o., male Today's Date: 08/02/2024    END OF SESSION:  OT End of Session - 08/02/24 1149     Visit Number 3    Number of Visits 5    Date for OT Re-Evaluation 08/14/24    Authorization Type [employer], $15 copay    Authorization Time Period no auth no limit    Progress Note Due on Visit 10    OT Start Time 1107    OT Stop Time 1145    OT Time Calculation (min) 38 min    Activity Tolerance Patient tolerated treatment well    Behavior During Therapy WFL for tasks assessed/performed            Past Medical History:  Diagnosis Date   Arthritis    Barrett's esophagus    BPH (benign prostatic hyperplasia)    Chronic kidney disease    Fracture of 5th metatarsal    GERD (gastroesophageal reflux disease)    Hypercholesteremia    Hypertension    Mastoiditis    Pre-diabetes    Ulcer disease    Bulbar   Past Surgical History:  Procedure Laterality Date   BACK SURGERY     BIOPSY  12/12/2021   Procedure: BIOPSY;  Surgeon: Golda Claudis PENNER, MD;  [facility];  Service: Endoscopy;;   COLONOSCOPY     COLONOSCOPY     COLONOSCOPY N/A 08/27/2016   Procedure: COLONOSCOPY;  Surgeon: Claudis PENNER Golda, MD;  [facility];  Service: Endoscopy;  Laterality: N/A;  930 - moved to 9/27 @ 12:00 - Ann notified pt   COLONOSCOPY N/A 12/12/2021   Procedure: COLONOSCOPY;  Surgeon: Golda Claudis PENNER, MD;  [facility];  Service: Endoscopy;  Laterality: N/A;  730   HEMORRHOID SURGERY  12/02/2007   INGUINAL HERNIA REPAIR Right 10/15/2020   Procedure: OPEN RIGHT INGUINAL HERNIA REPAIR WITH MESH;  Surgeon: Ethyl Lenis, MD;  Location: [facility] OR;  Service: General;  Laterality: Right;   INSERTION OF MESH Right 10/15/2020   Procedure: INSERTION OF MESH;  Surgeon: Ethyl Lenis, MD;  Location: [facility] OR;  Service: General;  Laterality: Right;   KNEE  ARTHROSCOPY     Right Knee   LAPAROTOMY N/A 06/20/2023   Procedure: EXPLORATORY LAPAROTOMY DRAIN ABDOMINAL ABSCESS;  Surgeon: Mavis Anes, MD;  Location: [facility];  Service: General;  Laterality: N/A;   ORIF TOE FRACTURE Left 12/09/2013   Procedure: LEFT OPEN REDUCTION INTERNAL FIXATION (ORIF) FIFTH METATARSAL FRACTURE;  Surgeon: Lamar DELENA Millman, MD;  Location: [facility];  Service: Orthopedics;  Laterality: Left;   POLYPECTOMY  08/27/2016   Procedure: POLYPECTOMY;  Surgeon: Claudis PENNER Golda, MD;  [facility];  Service: Endoscopy;;  colon   POLYPECTOMY  12/12/2021   Procedure: POLYPECTOMY;  Surgeon: Golda Claudis PENNER, MD;  [facility];  Service: Endoscopy;;   rotor cuff  12/02/2007   right-   TONSILLECTOMY     TOTAL SHOULDER REPLACEMENT     UPPER GASTROINTESTINAL ENDOSCOPY     Patient Active Problem List   Diagnosis Date Noted   Dry eye 06/08/2024   Dry mouth 06/08/2024   OAB (overactive bladder) 06/08/2024   Persistent microscopic hematuria 02/12/2024   Nocturia 02/12/2024   Actinic keratosis 11/06/2023   Central perforation of tympanic membrane of left ear 11/06/2023   Dysfunction of both eustachian tubes 11/06/2023   Kidney  stones 11/06/2023   Sinus bradycardia 06/20/2023   Hypokalemia 06/20/2023   Spondylolisthesis of lumbar region 06/11/2023   Arthropathy of lumbar facet joint 04/02/2023   History of bilateral mastoidectomy 09/19/2021   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 Gaither Langton  REFERRING PROVIDER: Dr. Reyes Budge  ONSET DATE: ~06/27/24  REFERRING DIAG: H43.77 (ICD-10-CM) - Lesion of ulnar nerve, left upper limb    THERAPY DIAG:  Pain in left elbow  Other symptoms and signs involving the nervous system  Rationale for Evaluation and Treatment: Rehabilitation  SUBJECTIVE:   SUBJECTIVE STATEMENT: S: I did not order a splint  PERTINENT HISTORY: Pt is a 73 y/o male s/p left cubital tunnel release approximately 2 weeks ago. Pt has had sensation issues for 2+ years, originally thought to be secondary to cervical neck issues, however after EMG it was determined he had an ulnar nerve issue.   PRECAUTIONS: None  WEIGHT BEARING RESTRICTIONS: No  PAIN:  Are you having pain? No  FALLS: Has patient fallen in last 6 months? No  PLOF: Independent  PATIENT GOALS: To have better sensation and less pain.    OBJECTIVE:  Note: Objective measures were completed at Evaluation unless otherwise noted.  HAND DOMINANCE: Right  ADLs: Pt reports numbness in his left hand, has difficulty with cutting food, manipulating the blinds in window. Pt has difficulty with lifting heavy and moving items.   FUNCTIONAL OUTCOME MEASURES: QUICK DASH  Please rate your ability do the following activities in the last week by selecting the number below the appropriate response.   Activities Rating  Open a tight or new jar.  3 = Moderate difficulty  Do heavy household chores (e.g., wash walls, floors). 2 = Mild difficulty  Carry a shopping bag or briefcase 2 = Mild difficulty  Wash your back. 1 = No difficulty   Use a knife to cut food. 3 = Moderate difficulty  Recreational activities in which you take some force or impact through your arm, shoulder or hand (e.g., golf, hammering, tennis, etc.). 2 = Mild difficulty  During the past week, to what extent has your arm, shoulder or hand problem interfered with your normal social activities with family, friends, neighbors or groups?  1 = Not at all  During the past week, were you limited in your work or other regular daily activities as a result of your arm, shoulder or hand  problem? 1 = Not limited at all  Rate the severity of the following symptoms in the last week: Arm, Shoulder, or hand pain. 2 = Mild  Rate the severity of the following symptoms in the last week: Tingling (pins and needles) in your arm, shoulder or hand. 2 = Mild  During the past week, how much difficulty have you had sleeping because of the pain in your arm, shoulder or hand?  1 = No difficulty   (A QuickDASH score may not be calculated if there is greater than 1 missing item.)  Quick Dash Disability/Symptom Score: [(sum of 20 (n) responses/11 (n)] x 25 = 45.45  Minimally Clinically Important Difference (MCID): 15-20 points  (Franchignoni, F. et al. (2013). Minimally clinically important difference of the disabilities  of the arm, shoulder, and hand outcome measures (DASH) and its shortened version (Quick DASH). Journal of Orthopaedic & Sports Physical Therapy, 44(1), 30-39)   UPPER EXTREMITY ROM:     Active ROM Left eval  Elbow flexion 144  Elbow extension 0  (Blank rows = not tested)   UPPER EXTREMITY MMT:     MMT Left eval  Elbow flexion 5/5  Elbow extension 5/5  Wrist flexion 5/5  Wrist extension 5/5  Wrist ulnar deviation 5/5  Wrist radial deviation 5/5  Wrist pronation 5/5  Wrist supination 5/5  (Blank rows = not tested)  HAND FUNCTION: Grip strength: Right: 60 lbs; Left: 50 lbs, Lateral pinch: Right: 17 lbs, Left: 7 lbs, and 3 point pinch: Right: 9 lbs, Left: 5 lbs  COORDINATION: 9 Hole Peg test: Right: 19.85 sec; Left: 25.30 sec  SENSATION: Light touch intact  COGNITION: Overall cognitive status: Within functional limits for tasks assessed  OBSERVATIONS: muscle atrophy at the thenar eminence   TREATMENT DATE:  08/02/24 -Fabricated nighttime volar elbow extension splint with elbow in approximately 15-20 degrees flexion -TENS trial: interferential, 9.2 CV, 10 minutes -Hand gripper: large and medium beads at 42# vertical, attempted small beads and was able  to pick up 2  07/26/24 -Discussed splint types and wear schedule - he elected to purchase a prefabricated splint to try first -Discussed sleeping positions to limit compression on ulnar nerve -Therabar: teal, extension/flexion twists, supination/pronation bends, ulnar/radial bends, x12 -Gripper: 37# 10 medium beads, 5 large beads, 55# 8 medium beads -Cube stacking, green resistance clip, 4 stacks of 6 cubes, unstacking with red resistance clip  Eval -Educated on HEP; discussed POC                                                                                                                              PATIENT EDUCATION: Education details: splint wear and care Person educated: Patient Education method: Programmer, multimedia, Facilities manager, and Handouts Education comprehension: verbalized understanding and returned demonstration  HOME EXERCISE PROGRAM: Eval: red theraputty grip/pinch strengthening 9/2: splint wear and care   GOALS: Goals reviewed with patient? Yes  SHORT TERM GOALS: Target date: 08/15/24  Pt will be provided with and educated on HEP to improve functional use of the LUE during ADLs.   Goal status: IN PROGRESS  2.  Pt will be educated on splint wear and care required for successful splint use.   Goal status: IN PROGRESS  3.  Pt will increase left grip strength by 5# and pinch strength by 3# to improve ability to grasp and move items during functional tasks.   Goal status: IN PROGRESS     ASSESSMENT:  CLINICAL IMPRESSION: Pt reports he did not purchase a pre-fabricated splint due to the high return rate. Volar based elbow extension splint fabricated this session for nighttime use. Trialed TENS unit at pt's request, pt wants to determine if a home unit would be beneficial. Continued with grip strengthening at  an increased resistance level. Verbal cuing for form and technique.    PERFORMANCE DEFICITS: in functional skills including ADLs, IADLs, sensation, strength,  pain, fascial restrictions, and UE functional use    PLAN:  OT FREQUENCY: 1x/week  OT DURATION: 4 weeks  PLANNED INTERVENTIONS: 97168 OT Re-evaluation, 97535 self care/ADL training, 02889 therapeutic exercise, 97530 therapeutic activity, 97112 neuromuscular re-education, 97032 electrical stimulation (manual), [id number] Orthotic Initial, patient/family education, and DME and/or AE instructions  CONSULTED AND AGREED WITH PLAN OF CARE: Patient  PLAN FOR NEXT SESSION: follow up on splint wear, reassessment   Sonny Cory, OTR/L  [phone] 08/02/2024, 11:50 AM

## 2024-08-09 ENCOUNTER — Ambulatory Visit (HOSPITAL_COMMUNITY): Admitting: Occupational Therapy

## 2024-08-16 ENCOUNTER — Encounter (HOSPITAL_COMMUNITY): Admitting: Occupational Therapy

## 2024-08-17 ENCOUNTER — Encounter (HOSPITAL_COMMUNITY): Payer: Self-pay | Admitting: Occupational Therapy

## 2024-08-17 ENCOUNTER — Ambulatory Visit (HOSPITAL_COMMUNITY): Admitting: Occupational Therapy

## 2024-08-17 DIAGNOSIS — M25522 Pain in left elbow: Secondary | ICD-10-CM

## 2024-08-17 DIAGNOSIS — R29818 Other symptoms and signs involving the nervous system: Secondary | ICD-10-CM

## 2024-08-17 NOTE — Therapy (Signed)
 OUTPATIENT OCCUPATIONAL THERAPY ORTHO TREATMENT NOTE  Patient Name: Frank [name] MRN: [id number] DOB:08/30/1951, 73 y.o., male Today's Date: 08/17/2024  OCCUPATIONAL THERAPY DISCHARGE SUMMARY  Visits from Start of Care: 4  Current functional level related to goals / functional outcomes: Pt has been provided a comprehensive HEP, an elbow splint was formed for him, and grip is improved.    Remaining deficits: Pt's pinch is still weak and he still has numbness in his L hand.   Education / Equipment: Pt provided comprehensive HEP.    Plan: Patient agrees to discharge as most goals have been fully met.      END OF SESSION:  OT End of Session - 08/17/24 1313     Visit Number 4    Number of Visits 5    Date for OT Re-Evaluation 08/14/24    Authorization Type [employer], $15 copay    Authorization Time Period no auth no limit    Progress Note Due on Visit 10    OT Start Time 1121    OT Stop Time 1204    OT Time Calculation (min) 43 min    Activity Tolerance Patient tolerated treatment well    Behavior During Therapy WFL for tasks assessed/performed          Past Medical History:  Diagnosis Date   Arthritis    Barrett's esophagus    BPH (benign prostatic hyperplasia)    Chronic kidney disease    Fracture of 5th metatarsal    GERD (gastroesophageal reflux disease)    Hypercholesteremia    Hypertension    Mastoiditis    Pre-diabetes    Ulcer disease    Bulbar   Past Surgical History:  Procedure Laterality Date   BACK SURGERY     BIOPSY  12/12/2021   Procedure: BIOPSY;  Surgeon: Golda Claudis PENNER, MD;  Location: [facility];  Service: Endoscopy;;   COLONOSCOPY     COLONOSCOPY     COLONOSCOPY N/A 08/27/2016   Procedure: COLONOSCOPY;  Surgeon: Claudis PENNER Golda, MD;  Location: [facility];  Service: Endoscopy;  Laterality: N/A;  930 - moved to 9/27 @ 12:00 - Ann notified pt   COLONOSCOPY N/A 12/12/2021   Procedure: COLONOSCOPY;  Surgeon: Golda Claudis PENNER, MD;  Location: [facility];  Service: Endoscopy;  Laterality: N/A;  730   HEMORRHOID SURGERY  12/02/2007   INGUINAL HERNIA REPAIR Right 10/15/2020   Procedure: OPEN RIGHT INGUINAL HERNIA REPAIR WITH MESH;  Surgeon: Ethyl Lenis, MD;  Location: [facility] OR;  Service: General;  Laterality: Right;   INSERTION OF MESH Right 10/15/2020   Procedure: INSERTION OF MESH;  Surgeon: Ethyl Lenis, MD;  Location: [facility] OR;  Service: General;  Laterality: Right;   KNEE ARTHROSCOPY     Right Knee   LAPAROTOMY N/A 06/20/2023   Procedure: EXPLORATORY LAPAROTOMY DRAIN ABDOMINAL ABSCESS;  Surgeon: Mavis Anes, MD;  Location: [facility];  Service: General;  Laterality: N/A;   ORIF TOE FRACTURE Left 12/09/2013   Procedure: LEFT OPEN REDUCTION INTERNAL FIXATION (ORIF) FIFTH METATARSAL FRACTURE;  Surgeon: Lamar DELENA Millman, MD;  Location: [facility];  Service: Orthopedics;  Laterality: Left;   POLYPECTOMY  08/27/2016   Procedure: POLYPECTOMY;  Surgeon: Claudis PENNER Golda, MD;  Location: [facility];  Service: Endoscopy;;  colon   POLYPECTOMY  12/12/2021   Procedure: POLYPECTOMY;  Surgeon: Golda Claudis PENNER, MD;  Location: [facility];  Service: Endoscopy;;   rotor cuff  12/02/2007   right-  TONSILLECTOMY     TOTAL SHOULDER REPLACEMENT     UPPER GASTROINTESTINAL ENDOSCOPY     Patient Active Problem List   Diagnosis Date Noted   Dry eye 06/08/2024   Dry mouth 06/08/2024   OAB (overactive bladder) 06/08/2024   Persistent microscopic hematuria 02/12/2024   Nocturia 02/12/2024   Actinic keratosis 11/06/2023   Central perforation of tympanic membrane of left ear 11/06/2023   Dysfunction of both eustachian tubes 11/06/2023   Kidney stones 11/06/2023   Sinus bradycardia 06/20/2023   Hypokalemia 06/20/2023   Spondylolisthesis of lumbar region 06/11/2023   Arthropathy of lumbar facet joint 04/02/2023   History of bilateral mastoidectomy 09/19/2021   Mixed conductive and sensorineural hearing  loss of both ears 09/19/2021   Mastoid disorder, left 09/16/2019   Left foot pain 02/14/2014   BPH (benign prostatic hyperplasia)    Barrett's esophagus    Ulcer disease    GERD (gastroesophageal reflux disease) 05/24/2012   HTN (hypertension) 05/24/2012   History of colonic polyps 05/24/2012   PCP: Dr. Gaither Langton  REFERRING PROVIDER: Dr. Reyes Budge  ONSET DATE: ~06/27/24  REFERRING DIAG: H43.77 (ICD-10-CM) - Lesion of ulnar nerve, left upper limb   THERAPY DIAG:  Pain in left elbow  Other symptoms and signs involving the nervous system  Rationale for Evaluation and Treatment: Rehabilitation  SUBJECTIVE:   SUBJECTIVE STATEMENT: S: I'm not keen on the splint but I wear it some  PERTINENT HISTORY: Pt is a 73 y/o male s/p left cubital tunnel release approximately 2 weeks ago. Pt has had sensation issues for 2+ years, originally thought to be secondary to cervical neck issues, however after EMG it was determined he had an ulnar nerve issue.   PRECAUTIONS: None  WEIGHT BEARING RESTRICTIONS: No  PAIN:  Are you having pain? No  FALLS: Has patient fallen in last 6 months? No  PLOF: Independent  PATIENT GOALS: To have better sensation and less pain.    OBJECTIVE:  Note: Objective measures were completed at Evaluation unless otherwise noted.  HAND DOMINANCE: Right  ADLs: Pt reports numbness in his left hand, has difficulty with cutting food, manipulating the blinds in window. Pt has difficulty with lifting heavy and moving items.   FUNCTIONAL OUTCOME MEASURES: QUICK DASH  Please rate your ability do the following activities in the last week by selecting the number below the appropriate response.   Activities Rating 08/17/24  Open a tight or new jar.  3 = Moderate difficulty 3 = Moderate Difficulty  Do heavy household chores (e.g., wash walls, floors). 2 = Mild difficulty 1 = No Difficulty  Carry a shopping bag or briefcase 2 = Mild difficulty 1 = No Difficulty    Wash your back. 1 = No difficulty  1 = No Difficulty  Use a knife to cut food. 3 = Moderate difficulty 2 = Mild Difficulty  Recreational activities in which you take some force or impact through your arm, shoulder or hand (e.g., golf, hammering, tennis, etc.). 2 = Mild difficulty 2 = Mild Difficulty  During the past week, to what extent has your arm, shoulder or hand problem interfered with your normal social activities with family, friends, neighbors or groups?  1 = Not at all 1 = Not at all  During the past week, were you limited in your work or other regular daily activities as a result of your arm, shoulder or hand problem? 1 = Not limited at all 1 = Not limited at all  Rate  the severity of the following symptoms in the last week: Arm, Shoulder, or hand pain. 2 = Mild 1 = None  Rate the severity of the following symptoms in the last week: Tingling (pins and needles) in your arm, shoulder or hand. 2 = Mild 3 = Moderate  During the past week, how much difficulty have you had sleeping because of the pain in your arm, shoulder or hand?  1 = No difficulty 3 = Moderate   (A QuickDASH score may not be calculated if there is greater than 1 missing item.)  Quick Dash Disability/Symptom Score: [(sum of 20 (n) responses/11 (n)] x 25 = 45.45 08/17/24: 19/11x25= 43.18  Minimally Clinically Important Difference (MCID): 15-20 points  (Franchignoni, F. et al. (2013). Minimally clinically important difference of the disabilities of the arm, shoulder, and hand outcome measures (DASH) and its shortened version (Quick DASH). Journal of Orthopaedic & Sports Physical Therapy, 44(1), 30-39)   UPPER EXTREMITY ROM:     Active ROM Left eval Left 08/17/24  Elbow flexion 144 144  Elbow extension 0 0  (Blank rows = not tested)   UPPER EXTREMITY MMT:     MMT Left eval Left 08/17/24  Elbow flexion 5/5 5/5  Elbow extension 5/5 5/5  Wrist flexion 5/5 5/5  Wrist extension 5/5 5/5  Wrist ulnar deviation 5/5  5/5  Wrist radial deviation 5/5 5/5  Wrist pronation 5/5 5/5  Wrist supination 5/5 5/5  (Blank rows = not tested)  HAND FUNCTION: Grip strength: Right: 60 lbs; Left: 50 lbs, Lateral pinch: Right: 17 lbs, Left: 7 lbs, and 3 point pinch: Right: 9 lbs, Left: 5 lbs Grip strength: Left: 66 lbs, Lateral pinch:  Left: 9 lbs, and 3 point pinch: Left: 6 lbs  COORDINATION: 9 Hole Peg test: Right: 19.85 sec; Left: 25.30 sec  SENSATION: Light touch intact  COGNITION: Overall cognitive status: Within functional limits for tasks assessed  OBSERVATIONS: muscle atrophy at the thenar eminence   TREATMENT DATE:  08/17/24 -TENS trial: interferential, 9.2 CV, 10 minutes -Theraputty: red, roll into  ball, squeeze, flatten, roll into log, tripod pinch, lateral pinch -Wrist stretches: flexion, extension, supination, 4x15 -Discussion on elbow splint, sleeping positions, TENs units for home  08/02/24 -Fabricated nighttime volar elbow extension splint with elbow in approximately 15-20 degrees flexion -TENS trial: interferential, 9.2 CV, 10 minutes -Hand gripper: large and medium beads at 42# vertical, attempted small beads and was able to pick up 2  07/26/24 -Discussed splint types and wear schedule - he elected to purchase a prefabricated splint to try first -Discussed sleeping positions to limit compression on ulnar nerve -Therabar: teal, extension/flexion twists, supination/pronation bends, ulnar/radial bends, x12 -Gripper: 37# 10 medium beads, 5 large beads, 55# 8 medium beads -Cube stacking, green resistance clip, 4 stacks of 6 cubes, unstacking with red resistance clip  Eval -Educated on HEP; discussed POC  PATIENT EDUCATION: Education details: splint wear and care Person educated: Patient Education method: Explanation, Demonstration, and Handouts Education  comprehension: verbalized understanding and returned demonstration  HOME EXERCISE PROGRAM: Eval: red theraputty grip/pinch strengthening 9/2: splint wear and care   GOALS: Goals reviewed with patient? Yes  SHORT TERM GOALS: Target date: 08/15/24  Pt will be provided with and educated on HEP to improve functional use of the LUE during ADLs.   Goal status: MET  2.  Pt will be educated on splint wear and care required for successful splint use.   Goal status: MET  3.  Pt will increase left grip strength by 5# and pinch strength by 3# to improve ability to grasp and move items during functional tasks.   Goal status: PARTIALLY MET     ASSESSMENT:  CLINICAL IMPRESSION: Pt completed reassessment this session. He is demonstrating improved grip, no pain, good follow through with HEP. He continues to have weak pinching and numbness in his last 3 digits of the left hand. Pt request discharge for now as all OT goals have been met at least partially and he feels that he has no further skilled OT needs.    PERFORMANCE DEFICITS: in functional skills including ADLs, IADLs, sensation, strength, pain, fascial restrictions, and UE functional use    PLAN:  OT FREQUENCY: 1x/week  OT DURATION: 4 weeks  PLANNED INTERVENTIONS: 97168 OT Re-evaluation, 97535 self care/ADL training, 02889 therapeutic exercise, 97530 therapeutic activity, 97112 neuromuscular re-education, 97032 electrical stimulation (manual), [id number] Orthotic Initial, patient/family education, and DME and/or AE instructions  CONSULTED AND AGREED WITH PLAN OF CARE: Patient  PLAN FOR NEXT SESSION: Discharge   Valentin Nightingale, OTR/L [phone] 08/17/2024, 1:16 PM

## 2024-08-19 DIAGNOSIS — M1711 Unilateral primary osteoarthritis, right knee: Secondary | ICD-10-CM | POA: Diagnosis not present

## 2024-09-01 ENCOUNTER — Other Ambulatory Visit: Payer: Self-pay

## 2024-09-01 DIAGNOSIS — R351 Nocturia: Secondary | ICD-10-CM

## 2024-09-01 DIAGNOSIS — N401 Enlarged prostate with lower urinary tract symptoms: Secondary | ICD-10-CM

## 2024-09-01 NOTE — Telephone Encounter (Signed)
 Return call to patient's wife and state's pt is currently out of his tamsulosin . Pt is aware a message will be sent to MD. Verbalized understanding.

## 2024-09-05 MED ORDER — TAMSULOSIN HCL 0.4 MG PO CAPS: 0.4000 mg | ORAL_CAPSULE | Freq: Every day | ORAL | 2 refills | Status: DC

## 2024-09-05 NOTE — Telephone Encounter (Signed)
 Pt is made aware flomax  sent to pharmacy. Verbalized understanding.

## 2024-09-27 ENCOUNTER — Ambulatory Visit: Attending: Rheumatology | Admitting: Rheumatology

## 2024-09-27 ENCOUNTER — Encounter: Payer: Self-pay | Admitting: Rheumatology

## 2024-09-27 ENCOUNTER — Ambulatory Visit

## 2024-09-27 VITALS — BP 129/79 | HR 71 | Temp 98.3°F | Ht 69.25 in | Wt 198.8 lb

## 2024-09-27 DIAGNOSIS — G5622 Lesion of ulnar nerve, left upper limb: Secondary | ICD-10-CM

## 2024-09-27 DIAGNOSIS — M79641 Pain in right hand: Secondary | ICD-10-CM

## 2024-09-27 DIAGNOSIS — M25562 Pain in left knee: Secondary | ICD-10-CM

## 2024-09-27 DIAGNOSIS — H04123 Dry eye syndrome of bilateral lacrimal glands: Secondary | ICD-10-CM

## 2024-09-27 DIAGNOSIS — R5383 Other fatigue: Secondary | ICD-10-CM

## 2024-09-27 DIAGNOSIS — N3281 Overactive bladder: Secondary | ICD-10-CM

## 2024-09-27 DIAGNOSIS — N200 Calculus of kidney: Secondary | ICD-10-CM

## 2024-09-27 DIAGNOSIS — K5720 Diverticulitis of large intestine with perforation and abscess without bleeding: Secondary | ICD-10-CM | POA: Diagnosis not present

## 2024-09-27 DIAGNOSIS — M62542 Muscle wasting and atrophy, not elsewhere classified, left hand: Secondary | ICD-10-CM | POA: Diagnosis not present

## 2024-09-27 DIAGNOSIS — Z96612 Presence of left artificial shoulder joint: Secondary | ICD-10-CM

## 2024-09-27 DIAGNOSIS — R682 Dry mouth, unspecified: Secondary | ICD-10-CM

## 2024-09-27 DIAGNOSIS — M25561 Pain in right knee: Secondary | ICD-10-CM

## 2024-09-27 DIAGNOSIS — H906 Mixed conductive and sensorineural hearing loss, bilateral: Secondary | ICD-10-CM

## 2024-09-27 DIAGNOSIS — K2270 Barrett's esophagus without dysplasia: Secondary | ICD-10-CM | POA: Diagnosis not present

## 2024-09-27 DIAGNOSIS — M20091 Other deformity of right finger(s): Secondary | ICD-10-CM

## 2024-09-27 DIAGNOSIS — M79642 Pain in left hand: Secondary | ICD-10-CM

## 2024-09-27 DIAGNOSIS — N401 Enlarged prostate with lower urinary tract symptoms: Secondary | ICD-10-CM

## 2024-09-27 DIAGNOSIS — M47816 Spondylosis without myelopathy or radiculopathy, lumbar region: Secondary | ICD-10-CM | POA: Diagnosis not present

## 2024-09-27 DIAGNOSIS — Z9089 Acquired absence of other organs: Secondary | ICD-10-CM

## 2024-09-27 DIAGNOSIS — Z860100 Personal history of colon polyps, unspecified: Secondary | ICD-10-CM

## 2024-09-27 DIAGNOSIS — G8929 Other chronic pain: Secondary | ICD-10-CM

## 2024-09-27 DIAGNOSIS — M4316 Spondylolisthesis, lumbar region: Secondary | ICD-10-CM | POA: Diagnosis not present

## 2024-09-27 DIAGNOSIS — L570 Actinic keratosis: Secondary | ICD-10-CM

## 2024-09-27 DIAGNOSIS — R350 Frequency of micturition: Secondary | ICD-10-CM

## 2024-09-27 DIAGNOSIS — I10 Essential (primary) hypertension: Secondary | ICD-10-CM

## 2024-09-27 NOTE — Progress Notes (Signed)
 Office Visit Note  Patient: Frank [name]             Date of Birth: Sep 01, 1951           MRN: [id number]             PCP: Sheryle Carwin, MD Referring: Josefina Chew, MD Visit Date: 09/27/2024 Occupation: Data Unavailable  Subjective:  Pain in multiple joints  History of Present Illness: Frank [name] is a 73 y.o. male patient was seen for the evaluation of joint pain.  According to him he has had pain in joints for the last several years.  He recalls having a lumbar spine surgery by Dr. Mavis in 2024 due to ongoing pain and discomfort.  He states while he was on a walker after the surgery he fell and injured his left shoulder.  He had torn rotator cuff and underwent left total shoulder replacement by Dr. Josefina in January 2024.  He states he had good recovery from the surgery.  He has been having symptoms of left ulnar nerve neuropathy for which she had surgery in July 2025 by Dr. Mavis.  He states he continues to have numbness in his left hand.  He also has discomfort in his bilateral hands.  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  He gets cortisone injections to his knee joints.  He states he was advised bilateral total knee replacement.  He also had right knee joint meniscal tear repair in the past.  He states for the last 2 years he has been having difficulty getting out of the chair due to discomfort in his joints and lower back.  He has not noticed joint swelling septic occasional swelling in his right knee joint. There is no family history of autoimmune disease.  He is right-handed, retired.  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  He enjoys doing yard work.  He walks for exercise.  He is married with 2 healthy children.  He does not drink any alcohol and has never been a smoker.    Activities of Daily Living:  Patient reports  morning stiffness for 1 hour.   Patient Reports nocturnal pain.  Difficulty dressing/grooming: Reports Difficulty climbing stairs: Reports Difficulty getting out of chair: Reports Difficulty using hands for taps, buttons, cutlery, and/or writing: Denies  Review of Systems  Constitutional:  Positive for fatigue.  HENT:  Negative for mouth sores and mouth dryness.   Eyes:  Positive for dryness.  Respiratory:  Negative for shortness of breath.   Cardiovascular:  Negative for chest pain and palpitations.  Gastrointestinal:  Negative for blood in stool, constipation and diarrhea.  Endocrine: Negative for increased urination.  Genitourinary:  Negative for involuntary urination.  Musculoskeletal:  Positive for joint pain, gait problem, joint pain, joint swelling, myalgias, muscle weakness, morning stiffness, muscle tenderness and myalgias.  Skin:  Negative for color change, rash and sensitivity to sunlight.  Allergic/Immunologic: Negative for susceptible to infections.  Neurological:  Negative for dizziness and headaches.  Hematological:  Negative for swollen glands.  Psychiatric/Behavioral:  Negative for depressed mood and sleep disturbance. The patient is not nervous/anxious.     PMFS History:  Patient Active Problem List   Diagnosis Date Noted   Dry eye 06/08/2024   Dry mouth 06/08/2024   OAB (overactive bladder) 06/08/2024   Persistent microscopic  hematuria 02/12/2024   Nocturia 02/12/2024   Actinic keratosis 11/06/2023   Central perforation of tympanic membrane of left ear 11/06/2023   Dysfunction of both eustachian tubes 11/06/2023   Kidney stones 11/06/2023   Sinus bradycardia 06/20/2023   Hypokalemia 06/20/2023   Spondylolisthesis of lumbar region 06/11/2023   Arthropathy of lumbar facet joint 04/02/2023   History of bilateral mastoidectomy 09/19/2021   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/19/2021   Mastoid disorder, left 09/16/2019   Left foot  pain 02/14/2014   BPH (benign prostatic hyperplasia)    Barrett's esophagus    Ulcer disease    GERD (gastroesophageal reflux disease) 05/24/2012   HTN (hypertension) 05/24/2012   History of colonic polyps 05/24/2012    Past Medical History:  Diagnosis Date   Arthritis    Barrett's esophagus    BPH (benign prostatic hyperplasia)    Chronic kidney disease    Fracture of 5th metatarsal    GERD (gastroesophageal reflux disease)    Hypercholesteremia    Hypertension    Mastoiditis    Pre-diabetes    Ulcer disease    Bulbar    Family History  Problem Relation Age of Onset   COPD Mother    Heart disease Father    Early death Father    Healthy Sister    Heart disease Sister    Lung cancer Brother    Heart disease Brother    Prostate cancer Brother    Healthy Son    Healthy Son    Past Surgical History:  Procedure Laterality Date   BACK SURGERY     BIOPSY  12/12/2021   Procedure: BIOPSY;  Surgeon: Golda Claudis PENNER, MD;  Location: [facility];  Service: Endoscopy;;   COLONOSCOPY     COLONOSCOPY     COLONOSCOPY N/A 08/27/2016   Procedure: COLONOSCOPY;  Surgeon: Claudis PENNER Golda, MD;  Location: [facility];  Service: Endoscopy;  Laterality: N/A;  930 - moved to 9/27 @ 12:00 - Ann notified pt   COLONOSCOPY N/A 12/12/2021   Procedure: COLONOSCOPY;  Surgeon: Golda Claudis PENNER, MD;  Location: [facility];  Service: Endoscopy;  Laterality: N/A;  730   ELBOW SURGERY Left 2025   HEMORRHOID SURGERY  12/02/2007   INGUINAL HERNIA REPAIR Right 10/15/2020   Procedure: OPEN RIGHT INGUINAL HERNIA REPAIR WITH MESH;  Surgeon: Ethyl Lenis, MD;  Location: [facility] OR;  Service: General;  Laterality: Right;   INSERTION OF MESH Right 10/15/2020   Procedure: INSERTION OF MESH;  Surgeon: Ethyl Lenis, MD;  Location: [facility] OR;  Service: General;  Laterality: Right;   KNEE ARTHROSCOPY     Right Knee   LAPAROTOMY N/A 06/20/2023   Procedure: EXPLORATORY LAPAROTOMY DRAIN ABDOMINAL ABSCESS;  Surgeon:  Mavis Anes, MD;  Location: [facility];  Service: General;  Laterality: N/A;   ORIF TOE FRACTURE Left 12/09/2013   Procedure: LEFT OPEN REDUCTION INTERNAL FIXATION (ORIF) FIFTH METATARSAL FRACTURE;  Surgeon: Lamar DELENA Millman, MD;  Location: [facility];  Service: Orthopedics;  Laterality: Left;   POLYPECTOMY  08/27/2016   Procedure: POLYPECTOMY;  Surgeon: Claudis PENNER Golda, MD;  [facility]: [facility];  Service: Endoscopy;;  colon   POLYPECTOMY  12/12/2021   Procedure: POLYPECTOMY;  Surgeon: Golda Claudis PENNER, MD;  Location: [facility];  Service: Endoscopy;;   rotor cuff  12/02/2007   right-   TONSILLECTOMY     TOTAL SHOULDER REPLACEMENT Left    UPPER GASTROINTESTINAL ENDOSCOPY  Social History   Tobacco Use   Smoking status: Never    Passive exposure: Past   Smokeless tobacco: Never  Vaping Use   Vaping status: Never Used  Substance Use Topics   Alcohol use: No   Drug use: No   Social History   Social History Narrative   Not on file      There is no immunization history on file for this patient.   Objective: Vital Signs: BP 129/79 (BP Location: Right Arm, Patient Position: Sitting, Cuff Size: Normal)   Pulse 71   Temp 98.3 F (36.8 C)   Ht 5' 9.25 (1.759 m)   Wt 198 lb 12.8 oz (90.2 kg)   BMI 29.15 kg/m    Physical Exam Vitals and nursing note reviewed.  Constitutional:      Appearance: He is well-developed.  HENT:     Head: Normocephalic and atraumatic.  Eyes:     Conjunctiva/sclera: Conjunctivae normal.     Pupils: Pupils are equal, round, and reactive to light.  Cardiovascular:     Rate and Rhythm: Normal rate and regular rhythm.     Heart sounds: Normal heart sounds.  Pulmonary:     Effort: Pulmonary effort is normal.     Breath sounds: Normal breath sounds.  Abdominal:     General: Bowel sounds are normal.     Palpations: Abdomen is soft.  Musculoskeletal:     Cervical back: Normal range of motion and neck supple.  Skin:     General: Skin is warm and dry.     Capillary Refill: Capillary refill takes less than 2 seconds.  Neurological:     Mental Status: He is alert and oriented to person, place, and time.  Psychiatric:        Behavior: Behavior normal.      Musculoskeletal Exam: He had good range of motion of the cervical, thoracic and lumbar spine.  He had some limitation with abduction and internal rotation of the left shoulder without discomfort.  Elbow joints and wrist joints in good range of motion.  Atrophy of the left hand thenar eminence and extrinsic muscles was noted.  There was no synovitis of her wrist joints, MCP joints, PIPs or DIPs.  Thickening of the right third MCP joint was noted.  Hip joints and knee joints in good range of motion.  Bilateral pes cavus was noted.  No synovitis was noted over MTPs or PIPs.  CDAI Exam: CDAI Score: -- Patient Global: --; Provider Global: -- Swollen: --; Tender: -- Joint Exam 09/27/2024   No joint exam has been documented for this visit   There is currently no information documented on the homunculus. Go to the Rheumatology activity and complete the homunculus joint exam.  Investigation: No additional findings.  Imaging: No results found.  Recent Labs: Lab Results  Component Value Date   WBC 9.3 06/24/2023   HGB 11.2 (L) 06/24/2023   PLT 595 (H) 06/24/2023   NA 135 06/24/2023   K 3.9 06/24/2023   CL 99 06/24/2023   CO2 24 06/24/2023   GLUCOSE 129 (H) 06/24/2023   BUN 19 06/24/2023   CREATININE 1.16 06/24/2023   BILITOT 0.5 06/24/2023   ALKPHOS 56 06/24/2023   AST 25 06/24/2023   ALT 27 06/24/2023   PROT 6.1 (L) 06/24/2023   ALBUMIN 2.5 (L) 06/24/2023   CALCIUM 8.7 (L) 06/24/2023   GFRAA 69 (L) 12/07/2013    Speciality Comments: No specialty comments available.  Procedures:  No procedures  performed Allergies: Metformin, Amoxicillin , and Ampicillin   Assessment / Plan:     Visit Diagnoses: Pain in both hands -he complains of  discomfort in his hands.  He has not noticed any joint swelling.  Right third MCP thickening was noted.  Plan: XR Hand 2 View Right, XR Hand 2 View Left, x-rays obtained today were reviewed.  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  Sedimentation rate, Uric acid, Rheumatoid factor, Cyclic citrul peptide antibody, IgG.  I reviewed his x-rays after the patient and left.  I will try to add serum ferritin, TIBC and magnesium  levels to the labs if possible.  Atrophy of muscle of left hand-patient states he developed atrophy of the muscles after the left ulnar nerve release.  He has tried physical therapy.  Ulnar neuropathy at elbow, left-status post surgery in July 2025 by Dr. Mavis for patient.  He still has some residual numbness in his left 4th and 5th fingers.  Status post reverse total shoulder replacement, left - By Dr. Josefina December 17, 2023.  He had limited range of motion of his left shoulder with abduction and internal rotation.  Chronic pain of both knees-he has discomfort in his bilateral knee joints.  He is followed at [facility].  Patient states he was advised bilateral total knee replacement.  Some warmth was palpable in his right knee joint.  Patient states he had meniscal tear repair on his right knee joint.  He had good range of motion without any discomfort.  Arthropathy of lumbar facet joint-he had lumbar spine surgery for degenerative disc disease and spinal stenosis.  He did quite well after the lumbar spine surgery.  However he still continues to have some discomfort intermittently.  He is followed by Dr. Mavis.  Spondylolisthesis of lumbar region  Dry mouth -he complains of dry mouth and dry eyes.  He also complains of fatigue.  No parotid swelling was noted.  Good salivary pool.  I will obtain labs today.  Over-the-counter products were discussed.  Plan: ANA, Sjogrens syndrome-A extractable nuclear antibody,  Sjogrens syndrome-B extractable nuclear antibody, C3 and C4  Bilateral dry eyes-use of eyedrops was discussed.  Other fatigue -he complains of increased fatigue recently.  I will obtain following labs today.  Plan: CBC with Differential/Platelet, Comprehensive metabolic panel with GFR, CK, TSH, Serum protein electrophoresis with reflex  Other medical problems are listed as follows:  Diverticulitis of large intestine with abscess, unspecified bleeding status  Barrett's esophagus without dysplasia  History of colonic polyps  History of bilateral mastoidectomy  Primary hypertension  Benign prostatic hyperplasia with urinary frequency  Actinic keratosis  Kidney stones  Mixed conductive and sensorineural hearing loss of both ears  OAB (overactive bladder)  Orders: Orders Placed This Encounter  Procedures   XR Hand 2 View Right   XR Hand 2 View Left   CBC with Differential/Platelet   Comprehensive metabolic panel with GFR   CK   TSH   Sedimentation rate   Uric acid   Rheumatoid factor   Cyclic citrul peptide antibody, IgG   ANA   Sjogrens syndrome-A extractable nuclear antibody   Sjogrens syndrome-B extractable nuclear antibody   C3 and C4   Serum protein electrophoresis with reflex   No orders of the defined types were placed in this encounter.    Follow-Up Instructions: Return for Polyarthralgia and fatigue.   Maya Nash, MD  Note - This record has been created using Animal nutritionist.  Chart creation errors have been sought, but may not always  have been located. Such creation errors do not reflect on  the standard of medical care.

## 2024-09-30 ENCOUNTER — Ambulatory Visit: Payer: Self-pay | Admitting: Rheumatology

## 2024-09-30 NOTE — Progress Notes (Signed)
 CBC normal, CMP shows mildly elevated glucose probably not a fasting sample.  CK normal, TSH normal, sed rate normal, uric acid normal, rheumatoid factor is mildly elevated, anti-CCP negative, ANA negative, SSA and SSB antibodies negative, C3-C4 normal, iron studies normal.  Will discuss results at the follow-up visit.

## 2024-10-04 ENCOUNTER — Telehealth: Payer: Self-pay | Admitting: Rheumatology

## 2024-10-04 LAB — PROTEIN ELECTROPHORESIS, SERUM, WITH REFLEX
Albumin ELP: 4.4 g/dL (ref 3.8–4.8)
Alpha 1: 0.3 g/dL (ref 0.2–0.3)
Alpha 2: 1 g/dL — ABNORMAL HIGH (ref 0.5–0.9)
Beta 2: 0.4 g/dL (ref 0.2–0.5)
Beta Globulin: 0.4 g/dL (ref 0.4–0.6)
Gamma Globulin: 0.7 g/dL — ABNORMAL LOW (ref 0.8–1.7)
Total Protein: 7.2 g/dL (ref 6.1–8.1)

## 2024-10-04 LAB — TEST AUTHORIZATION

## 2024-10-04 LAB — CBC WITH DIFFERENTIAL/PLATELET
Absolute Lymphocytes: 2057 {cells}/uL (ref 850–3900)
Absolute Monocytes: 876 {cells}/uL (ref 200–950)
Basophils Absolute: 51 {cells}/uL (ref 0–200)
Basophils Relative: 0.6 %
Eosinophils Absolute: 187 {cells}/uL (ref 15–500)
Eosinophils Relative: 2.2 %
HCT: 46.3 % (ref 38.5–50.0)
Hemoglobin: 15.4 g/dL (ref 13.2–17.1)
MCH: 29.6 pg (ref 27.0–33.0)
MCHC: 33.3 g/dL (ref 32.0–36.0)
MCV: 89 fL (ref 80.0–100.0)
MPV: 11 fL (ref 7.5–12.5)
Monocytes Relative: 10.3 %
Neutro Abs: 5330 {cells}/uL (ref 1500–7800)
Neutrophils Relative %: 62.7 %
Platelets: 322 Thousand/uL (ref 140–400)
RBC: 5.2 Million/uL (ref 4.20–5.80)
RDW: 13.6 % (ref 11.0–15.0)
Total Lymphocyte: 24.2 %
WBC: 8.5 Thousand/uL (ref 3.8–10.8)

## 2024-10-04 LAB — COMPREHENSIVE METABOLIC PANEL WITH GFR
AG Ratio: 1.9 (calc) (ref 1.0–2.5)
ALT: 18 U/L (ref 9–46)
AST: 17 U/L (ref 10–35)
Albumin: 4.7 g/dL (ref 3.6–5.1)
Alkaline phosphatase (APISO): 78 U/L (ref 35–144)
BUN: 23 mg/dL (ref 7–25)
CO2: 27 mmol/L (ref 20–32)
Calcium: 10.1 mg/dL (ref 8.6–10.3)
Chloride: 102 mmol/L (ref 98–110)
Creat: 1.15 mg/dL (ref 0.70–1.28)
Globulin: 2.5 g/dL (ref 1.9–3.7)
Glucose, Bld: 118 mg/dL — ABNORMAL HIGH (ref 65–99)
Potassium: 3.9 mmol/L (ref 3.5–5.3)
Sodium: 140 mmol/L (ref 135–146)
Total Bilirubin: 0.4 mg/dL (ref 0.2–1.2)
Total Protein: 7.2 g/dL (ref 6.1–8.1)
eGFR: 67 mL/min/1.73m2 (ref >=60)

## 2024-10-04 LAB — IRON,TIBC AND FERRITIN PANEL
%SAT: 20 % (ref 20–48)
Ferritin: 148 ng/mL (ref 24–380)
Iron: 70 ug/dL (ref 50–180)
TIBC: 344 ug/dL (ref 250–425)

## 2024-10-04 LAB — CK: Total CK: 149 U/L (ref 19–278)

## 2024-10-04 LAB — URIC ACID: Uric Acid, Serum: 7.2 mg/dL (ref 4.0–8.0)

## 2024-10-04 LAB — SJOGRENS SYNDROME-B EXTRACTABLE NUCLEAR ANTIBODY: SSB (La) (ENA) Antibody, IgG: <1 AI

## 2024-10-04 LAB — CYCLIC CITRUL PEPTIDE ANTIBODY, IGG: Cyclic Citrullin Peptide Ab: <16 U

## 2024-10-04 LAB — IFE INTERPRETATION

## 2024-10-04 LAB — C3 AND C4
C3 Complement: 155 mg/dL (ref 82–185)
C4 Complement: 22 mg/dL (ref 15–53)

## 2024-10-04 LAB — ANA: Anti Nuclear Antibody (ANA): NEGATIVE

## 2024-10-04 LAB — SJOGRENS SYNDROME-A EXTRACTABLE NUCLEAR ANTIBODY: SSA (Ro) (ENA) Antibody, IgG: <1 AI

## 2024-10-04 LAB — TSH: TSH: 2.66 m[IU]/L (ref 0.40–4.50)

## 2024-10-04 LAB — SEDIMENTATION RATE: Sed Rate: 11 mm/h (ref 0–20)

## 2024-10-04 LAB — RHEUMATOID FACTOR: Rheumatoid fact SerPl-aCnc: 15 [IU]/mL — ABNORMAL HIGH (ref <14)

## 2024-10-04 NOTE — Telephone Encounter (Signed)
 Patient scheduled for 12/16/2023

## 2024-10-04 NOTE — Telephone Encounter (Signed)
 Patient called stating he was able to review his labwork on Mychart and is very concerned with the results.  Patient states he doesn't want to wait until his follow-up appointment on 11/01/24 to get the results and start on medication.  Patient requested a return call.

## 2024-10-05 ENCOUNTER — Encounter: Payer: Self-pay | Admitting: Rheumatology

## 2024-10-05 ENCOUNTER — Ambulatory Visit: Attending: Rheumatology | Admitting: Rheumatology

## 2024-10-05 VITALS — BP 114/83 | HR 77 | Temp 98.2°F | Resp 16 | Ht 69.5 in | Wt 197.4 lb

## 2024-10-05 DIAGNOSIS — M79642 Pain in left hand: Secondary | ICD-10-CM

## 2024-10-05 DIAGNOSIS — Z860100 Personal history of colon polyps, unspecified: Secondary | ICD-10-CM | POA: Diagnosis not present

## 2024-10-05 DIAGNOSIS — K2270 Barrett's esophagus without dysplasia: Secondary | ICD-10-CM | POA: Diagnosis not present

## 2024-10-05 DIAGNOSIS — M47816 Spondylosis without myelopathy or radiculopathy, lumbar region: Secondary | ICD-10-CM | POA: Diagnosis not present

## 2024-10-05 DIAGNOSIS — N200 Calculus of kidney: Secondary | ICD-10-CM

## 2024-10-05 DIAGNOSIS — R7689 Other specified abnormal immunological findings in serum: Secondary | ICD-10-CM | POA: Diagnosis not present

## 2024-10-05 DIAGNOSIS — M170 Bilateral primary osteoarthritis of knee: Secondary | ICD-10-CM | POA: Diagnosis not present

## 2024-10-05 DIAGNOSIS — M79641 Pain in right hand: Secondary | ICD-10-CM

## 2024-10-05 DIAGNOSIS — R350 Frequency of micturition: Secondary | ICD-10-CM

## 2024-10-05 DIAGNOSIS — Z96612 Presence of left artificial shoulder joint: Secondary | ICD-10-CM | POA: Diagnosis not present

## 2024-10-05 DIAGNOSIS — L570 Actinic keratosis: Secondary | ICD-10-CM

## 2024-10-05 DIAGNOSIS — K5720 Diverticulitis of large intestine with perforation and abscess without bleeding: Secondary | ICD-10-CM

## 2024-10-05 DIAGNOSIS — M4316 Spondylolisthesis, lumbar region: Secondary | ICD-10-CM

## 2024-10-05 DIAGNOSIS — G5622 Lesion of ulnar nerve, left upper limb: Secondary | ICD-10-CM

## 2024-10-05 DIAGNOSIS — M3500 Sicca syndrome, unspecified: Secondary | ICD-10-CM | POA: Diagnosis not present

## 2024-10-05 DIAGNOSIS — Z9089 Acquired absence of other organs: Secondary | ICD-10-CM

## 2024-10-05 DIAGNOSIS — I10 Essential (primary) hypertension: Secondary | ICD-10-CM

## 2024-10-05 DIAGNOSIS — R5383 Other fatigue: Secondary | ICD-10-CM

## 2024-10-05 DIAGNOSIS — H906 Mixed conductive and sensorineural hearing loss, bilateral: Secondary | ICD-10-CM

## 2024-10-05 DIAGNOSIS — N3281 Overactive bladder: Secondary | ICD-10-CM

## 2024-10-05 DIAGNOSIS — N401 Enlarged prostate with lower urinary tract symptoms: Secondary | ICD-10-CM

## 2024-10-05 NOTE — Progress Notes (Signed)
 Office Visit Note  Patient: Frank [name]             Date of Birth: [birth year]           MRN: [id number]             PCP: Sheryle Carwin, MD Referring: Sheryle Carwin, MD Visit Date: 10/05/2024 Occupation: Data Unavailable  Subjective:  Pain in multiple joints  History of Present Illness: Frank [name] is a 73 y.o. male with polyarthralgia and osteoarthritis.  Frank [name] returns today after his initial visit on September 27, 2024.  Frank [name] states Frank [name] continues to have pain and stiffness in his hands.  Frank [name] has not noticed any swelling.  Frank [name] continues to have some discomfort in his shoulders, lower back, knees and his hips.  Frank [name] has been followed by Dr. Josefina.  Frank [name] wanted to discuss lab results and x-ray results.  Frank [name] continues to have fatigue and dry mouth.    Activities of Daily Living:  Patient reports morning stiffness for 1 hour.   Patient Reports nocturnal pain.  Difficulty dressing/grooming: Denies Difficulty climbing stairs: Reports Difficulty getting out of chair: Reports Difficulty using hands for taps, buttons, cutlery, and/or writing: Reports  Review of Systems  Constitutional:  Negative for fatigue.  HENT:  Positive for mouth dryness. Negative for mouth sores.   Eyes:  Positive for dryness.  Respiratory:  Negative for shortness of breath.   Cardiovascular:  Negative for chest pain and palpitations.  Gastrointestinal:  Negative for blood in stool, constipation and diarrhea.  Endocrine: Negative for increased urination.  Genitourinary:  Negative for involuntary urination.  Musculoskeletal:  Positive for joint pain, joint pain, myalgias, morning stiffness, muscle tenderness and myalgias. Negative for gait problem, joint swelling and muscle weakness.  Skin:  Negative for color change, rash, hair loss and sensitivity to sunlight.  Allergic/Immunologic: Negative for susceptible to infections.  Neurological:  Positive for numbness. Negative for dizziness and headaches.  Hematological:  Negative  for swollen glands.  Psychiatric/Behavioral:  Negative for depressed mood and sleep disturbance. The patient is not nervous/anxious.     PMFS History:  Patient Active Problem List   Diagnosis Date Noted   Dry eye 06/08/2024   Dry mouth 06/08/2024   OAB (overactive bladder) 06/08/2024   Persistent microscopic hematuria 02/12/2024   Nocturia 02/12/2024   Actinic keratosis 11/06/2023   Central perforation of tympanic membrane of left ear 11/06/2023   Dysfunction of both eustachian tubes 11/06/2023   Kidney stones 11/06/2023   Sinus bradycardia 06/20/2023   Hypokalemia 06/20/2023   Spondylolisthesis of lumbar region 06/11/2023   Arthropathy of lumbar facet joint 04/02/2023   History of bilateral mastoidectomy 09/19/2021   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/19/2021   Mastoid disorder, left 09/16/2019   Left foot pain 02/14/2014   BPH (benign prostatic hyperplasia)    Barrett's esophagus    Ulcer disease    GERD (gastroesophageal reflux disease) 05/24/2012   HTN (hypertension) 05/24/2012   History of colonic polyps 05/24/2012    Past Medical History:  Diagnosis Date   Arthritis    Barrett's esophagus    BPH (benign prostatic hyperplasia)    Chronic kidney disease    Fracture of 5th metatarsal    GERD (gastroesophageal reflux disease)    Hypercholesteremia    Hypertension    Mastoiditis    Pre-diabetes    Ulcer disease    Bulbar    Family History  Problem Relation Age of Onset  COPD Mother    Heart disease Father    Early death Father    Healthy Sister    Heart disease Sister    Lung cancer Brother    Heart disease Brother    Prostate cancer Brother    Healthy Son    Healthy Son    Past Surgical History:  Procedure Laterality Date   BACK SURGERY     BIOPSY  12/12/2021   Procedure: BIOPSY;  Surgeon: Golda Claudis PENNER, MD;  [facility];  Service: Endoscopy;;   COLONOSCOPY     COLONOSCOPY     COLONOSCOPY N/A 08/27/2016    Procedure: COLONOSCOPY;  Surgeon: Claudis PENNER Golda, MD;  [facility];  Service: Endoscopy;  Laterality: N/A;  930 - moved to 9/27 @ 12:00 - Ann notified pt   COLONOSCOPY N/A 12/12/2021   Procedure: COLONOSCOPY;  Surgeon: Golda Claudis PENNER, MD;  [facility];  Service: Endoscopy;  Laterality: N/A;  730   ELBOW SURGERY Left 2025   HEMORRHOID SURGERY  12/02/2007   INGUINAL HERNIA REPAIR Right 10/15/2020   Procedure: OPEN RIGHT INGUINAL HERNIA REPAIR WITH MESH;  Surgeon: Ethyl Lenis, MD;  Location: [facility] OR;  Service: General;  Laterality: Right;   INSERTION OF MESH Right 10/15/2020   Procedure: INSERTION OF MESH;  Surgeon: Ethyl Lenis, MD;  Location: [facility] OR;  Service: General;  Laterality: Right;   KNEE ARTHROSCOPY     Right Knee   LAPAROTOMY N/A 06/20/2023   Procedure: EXPLORATORY LAPAROTOMY DRAIN ABDOMINAL ABSCESS;  Surgeon: Mavis Anes, MD;  [facility]: [facility];  Service: General;  Laterality: N/A;   ORIF TOE FRACTURE Left 12/09/2013   Procedure: LEFT OPEN REDUCTION INTERNAL FIXATION (ORIF) FIFTH METATARSAL FRACTURE;  Surgeon: Lamar DELENA Millman, MD;  Location: [facility];  Service: Orthopedics;  Laterality: Left;   POLYPECTOMY  08/27/2016   Procedure: POLYPECTOMY;  Surgeon: Claudis PENNER Golda, MD;  [facility];  Service: Endoscopy;;  colon   POLYPECTOMY  12/12/2021   Procedure: POLYPECTOMY;  Surgeon: Golda Claudis PENNER, MD;  [facility];  Service: Endoscopy;;   rotor cuff  12/02/2007   right-   TONSILLECTOMY     TOTAL SHOULDER REPLACEMENT Left    UPPER GASTROINTESTINAL ENDOSCOPY     Social History   Tobacco Use   Smoking status: Never    Passive exposure: Past   Smokeless tobacco: Never  Vaping Use   Vaping status: Never Used  Substance Use Topics   Alcohol use: No   Drug use: No   Social History   Social History Narrative   Not on file      There is no immunization history on file for this patient.    Objective: Vital Signs: BP 114/83   Pulse 77   Temp 98.2 F (36.8 C)   Resp 16   Ht 5' 9.5 (1.765 m)   Wt 197 lb 6.4 oz (89.5 kg)   BMI 28.73 kg/m    Physical Exam Vitals and nursing note reviewed.  Constitutional:      Appearance: Frank [name] is well-developed.  HENT:     Head: Normocephalic and atraumatic.  Eyes:     Conjunctiva/sclera: Conjunctivae normal.     Pupils: Pupils are equal, round, and reactive to light.  Cardiovascular:     Rate and Rhythm: Normal rate and regular rhythm.     Heart sounds: Normal heart sounds.  Pulmonary:     Effort: Pulmonary effort is normal.  Breath sounds: Normal breath sounds.  Abdominal:     General: Bowel sounds are normal.     Palpations: Abdomen is soft.  Musculoskeletal:     Cervical back: Normal range of motion and neck supple.  Skin:    General: Skin is warm and dry.     Capillary Refill: Capillary refill takes less than 2 seconds.  Neurological:     Mental Status: Frank [name] is alert and oriented to person, place, and time.  Psychiatric:        Behavior: Behavior normal.      Musculoskeletal Exam: Cervical spine was in good range of motion.  Frank [name] discomfort with range of motion of the lumbar spine.  Shoulder with abduction and internal rotation was limited without discomfort.  Elbow joints and wrist joints with good range of motion.  There is thickening of bilateral MCP joints PIP and DIP joints without any synovitis.  Hip joints were in good range of motion with some discomfort.  Knee joints were in good range of motion without any warmth swelling or effusion.  There was no tenderness over ankles or MTPs.  CDAI Exam: CDAI Score: -- Patient Global: --; Provider Global: -- Swollen: --; Tender: -- Joint Exam 10/05/2024   No joint exam has been documented for this visit   There is currently no information documented on the homunculus. Go to the Rheumatology activity and complete the homunculus joint exam.  Investigation: No  additional findings.  Imaging: XR Hand 2 View Left Result Date: 09/27/2024 CMC, PIP and DIP narrowing was noted.  1st, 2nd and 3rd MCP joint narrowing with calcification around the 2nd, 3rd and 4th MCP joints was noted.  No intercarpal or radiocarpal joint space narrowing was noted.  No erosive changes were noted. 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  XR Hand 2 View Right Result Date: 09/27/2024 CMC, PIP and DIP narrowing was noted.  Severe narrowing of 1st, 2nd and 3rd MCP joints with overhanging osteophytes was noted.  Calcification around the MCP joints and in the wrist joints was noted.  No intercarpal or radiocarpal joint space narrowing was noted.  No juxta articular osteopenia or erosive changes were noted. 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   Recent Labs: Lab Results  Component Value Date   WBC 8.5 09/27/2024   HGB 15.4 09/27/2024   PLT 322 09/27/2024   NA 140 09/27/2024   K 3.9 09/27/2024   CL 102 09/27/2024   CO2 27 09/27/2024   GLUCOSE 118 (H) 09/27/2024   BUN 23 09/27/2024   CREATININE 1.15 09/27/2024   BILITOT 0.4 09/27/2024   ALKPHOS 56 06/24/2023   AST 17 09/27/2024   ALT 18 09/27/2024   PROT 7.2 09/27/2024   PROT 7.2 09/27/2024   ALBUMIN 2.5 (L) 06/24/2023   CALCIUM 10.1 09/27/2024   GFRAA 69 (L) 12/07/2013   September 27, 2024 IFE normal, iron studies normal, CK149, TSH normal, sed rate 11, uric acid 7.2, RF 15, anti-CCP<16, ANA negative, SSA negative, SSB negative, C3-C4 normal  Speciality Comments: No specialty comments available.  Procedures:  No procedures performed Allergies: Metformin, Amoxicillin , and Ampicillin   Assessment / Plan:     Visit Diagnoses: Pain in both hands - Right third MCP thickening was noted on the examination without any synovitis.  X-rays obtained at the last visit were suggestive of osteoarthritis and possible crystal  induced arthropathy.  Calcification was noted around MCP joints and a hanging  osteophytes were noted which were typically seen in pseudogout.  I did detailed discussion with the patient and his wife.  Labs obtained at the last visit showed low titer rheumatoid factor but Frank [name] had no synovitis on the examination.  I discussed the possible use of colchicine if Frank [name] develops any swelling.  Frank [name] would prefer not to get a prescription at this time.  Detail counts regarding osteoarthritis was provided.  Joint protection muscle strengthening was discussed.  A handout on hand exercises was given.  Rheumatoid factor positive - Rheumatoid factor low positive at 15.  Anti-CCP negative, ANA negative, complements normal.  No synovitis was noted.  Patient was advised to contact us  if Frank [name] develops swelling.  Ulnar neuropathy at elbow, left - Status post surgery July 2025 by Dr. Mavis.  Still with residual numbness in 4th and 5th fingers.  Status post reverse total shoulder replacement, left - Dr. Josefina Savers 16 2025.  Limited range of motion.  Primary osteoarthritis of both knees -continues to have discomfort in his knee joints.  Patient was advised bilateral total knee replacement per patient.  Arthropathy of lumbar facet joint - Followed by Dr. Mavis previous x-rays showed multilevel degenerative disc disease.  Spondylolisthesis of lumbar region  Sicca complex - ANA dative, SSA negative, SSB negative, complements normal.  Lab results were discussed with the patient.  Over-the-counter products were discussed.  Other fatigue-patient continues to have fatigue.  All the labs were normal which were discussed with him.  I advised him to discuss it further with his PCP.  Frank [name] may benefit from a sleep study.  Other medical problems are listed as follows:  Diverticulitis of large intestine with abscess, unspecified bleeding status  Barrett's esophagus without dysplasia  History of colonic polyps  History of  bilateral mastoidectomy  Primary hypertension  Benign prostatic hyperplasia with urinary frequency  Actinic keratosis  Kidney stones  Mixed conductive and sensorineural hearing loss of both ears  OAB (overactive bladder)  Orders: No orders of the defined types were placed in this encounter.  No orders of the defined types were placed in this encounter.    Follow-Up Instructions: Return in about 6 months (around 04/04/2025) for Osteoarthritis.   Maya Nash, MD  Note - This record has been created using Animal nutritionist.  Chart creation errors have been sought, but may not always  have been located. Such creation errors do not reflect on  the standard of medical care.

## 2024-10-05 NOTE — Patient Instructions (Signed)

## 2024-10-18 DIAGNOSIS — M48062 Spinal stenosis, lumbar region with neurogenic claudication: Secondary | ICD-10-CM | POA: Diagnosis not present

## 2024-10-21 DIAGNOSIS — H918X3 Other specified hearing loss, bilateral: Secondary | ICD-10-CM | POA: Diagnosis not present

## 2024-10-21 DIAGNOSIS — H6993 Unspecified Eustachian tube disorder, bilateral: Secondary | ICD-10-CM | POA: Diagnosis not present

## 2024-10-21 DIAGNOSIS — Z4589 Encounter for adjustment and management of other implanted devices: Secondary | ICD-10-CM | POA: Diagnosis not present

## 2024-10-21 DIAGNOSIS — Z974 Presence of external hearing-aid: Secondary | ICD-10-CM | POA: Diagnosis not present

## 2024-10-21 DIAGNOSIS — Z9622 Myringotomy tube(s) status: Secondary | ICD-10-CM | POA: Diagnosis not present

## 2024-10-21 DIAGNOSIS — Z9089 Acquired absence of other organs: Secondary | ICD-10-CM | POA: Diagnosis not present

## 2024-10-21 DIAGNOSIS — H906 Mixed conductive and sensorineural hearing loss, bilateral: Secondary | ICD-10-CM | POA: Diagnosis not present

## 2024-10-21 DIAGNOSIS — H95193 Other disorders following mastoidectomy, bilateral ears: Secondary | ICD-10-CM | POA: Diagnosis not present

## 2024-10-25 DIAGNOSIS — H9072 Mixed conductive and sensorineural hearing loss, unilateral, left ear, with unrestricted hearing on the contralateral side: Secondary | ICD-10-CM | POA: Diagnosis not present

## 2024-11-01 ENCOUNTER — Ambulatory Visit: Admitting: Rheumatology

## 2024-12-09 ENCOUNTER — Ambulatory Visit: Admitting: Urology

## 2024-12-19 ENCOUNTER — Encounter: Payer: Self-pay | Admitting: Urology

## 2024-12-19 ENCOUNTER — Ambulatory Visit: Admitting: Urology

## 2024-12-19 VITALS — BP 127/69 | HR 75

## 2024-12-19 DIAGNOSIS — R3129 Other microscopic hematuria: Secondary | ICD-10-CM

## 2024-12-19 DIAGNOSIS — N3281 Overactive bladder: Secondary | ICD-10-CM

## 2024-12-19 DIAGNOSIS — N401 Enlarged prostate with lower urinary tract symptoms: Secondary | ICD-10-CM | POA: Diagnosis not present

## 2024-12-19 DIAGNOSIS — Z87442 Personal history of urinary calculi: Secondary | ICD-10-CM | POA: Diagnosis not present

## 2024-12-19 DIAGNOSIS — R351 Nocturia: Secondary | ICD-10-CM

## 2024-12-19 LAB — URINALYSIS, ROUTINE W REFLEX MICROSCOPIC
Bilirubin, UA: NEGATIVE
Glucose, UA: NEGATIVE
Ketones, UA: NEGATIVE
Leukocytes,UA: NEGATIVE
Nitrite, UA: NEGATIVE
Protein,UA: NEGATIVE
Specific Gravity, UA: 1.015 (ref 1.005–1.030)
Urobilinogen, Ur: 0.2 mg/dL (ref 0.2–1.0)
pH, UA: 6 (ref 5.0–7.5)

## 2024-12-19 LAB — MICROSCOPIC EXAMINATION: Bacteria, UA: NONE SEEN

## 2024-12-19 MED ORDER — OXYBUTYNIN CHLORIDE 5 MG PO TABS: 5.0000 mg | ORAL_TABLET | Freq: Every day | ORAL | 3 refills | Status: AC

## 2024-12-19 MED ORDER — TAMSULOSIN HCL 0.4 MG PO CAPS: 0.4000 mg | ORAL_CAPSULE | Freq: Every day | ORAL | 3 refills | Status: DC

## 2024-12-19 NOTE — Progress Notes (Unsigned)
 "  12/19/2024 12:17 PM   Frank [name] Blood 20-Jul-1951 [id number]  Referring provider: Sheryle Carwin, MD [address] [place],  [place] [address]  No chief complaint on file.   HPI:  New patient for me-  1) BPH with lower urinary tract symptoms-about 40 g prostate on CT in 2024.  Patient on tamsulosin  and oxybutynin  5 mg daily. Was on Gemtesa  but expensive.   2) urolithiasis-CT July 2024 with bilateral punctate nonobstructive stones.  July 2025 KUB-renal calculi on prior CT are not definitively identified on radiograph.  3) microscopic hematuria-July 2024 CT benign.  February 2025 cystoscopy with Dr. Watt benign.  A few inflammatory polyps in the prostatic urethra.  12/19/2024: Today, seen for the above.  IPSS 8. On tamsulosin  and oxybutynin . No flank pain or stone passage. No gross hematuria. He recalls PSA of 1 something with Dr. Sheryle end of 2025.   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   ^^^^^^^^^^^^^^^^^^^^^^^^^ A/P:  BPH, LUTS - discussed other options - daily pde5i, other oab meds. He will continue tams and oxyb. Declined PSA screening here   PMH: Past Medical History:  Diagnosis Date   Arthritis    Barrett's esophagus    BPH (benign prostatic hyperplasia)    Chronic kidney disease    Fracture of 5th metatarsal    GERD (gastroesophageal reflux disease)    Hypercholesteremia    Hypertension    Mastoiditis    Pre-diabetes    Ulcer disease    Bulbar    Surgical History: Past Surgical History:  Procedure Laterality Date   BACK SURGERY     BIOPSY  12/12/2021   Procedure: BIOPSY;  Surgeon: Golda Claudis PENNER, MD;  [facility];  Service: Endoscopy;;   COLONOSCOPY     COLONOSCOPY     COLONOSCOPY N/A 08/27/2016   Procedure: COLONOSCOPY;  Surgeon: Claudis PENNER Golda, MD;  [facility];  Service: Endoscopy;  Laterality: N/A;  930 - moved to 9/27 @ 12:00 - Ann notified pt   COLONOSCOPY N/A 12/12/2021   Procedure: COLONOSCOPY;  Surgeon: Golda Claudis PENNER, MD;  [facility];  Service: Endoscopy;  Laterality: N/A;  730   ELBOW SURGERY Left 2025   HEMORRHOID SURGERY  12/02/2007   INGUINAL HERNIA REPAIR Right 10/15/2020   Procedure: OPEN RIGHT INGUINAL HERNIA REPAIR WITH MESH;  Surgeon: Ethyl Lenis, MD;  Location: [facility] OR;  Service: General;  Laterality: Right;   INSERTION OF MESH Right 10/15/2020   Procedure: INSERTION OF MESH;  Surgeon: Ethyl Lenis, MD;  Location: [facility] OR;  Service: General;  Laterality: Right;   KNEE ARTHROSCOPY     Right Knee   LAPAROTOMY N/A 06/20/2023   Procedure: EXPLORATORY LAPAROTOMY DRAIN ABDOMINAL ABSCESS;  Surgeon: Mavis Anes, MD;  [facility];  Service: General;  Laterality: N/A;   ORIF TOE FRACTURE Left 12/09/2013   Procedure: LEFT OPEN REDUCTION INTERNAL FIXATION (ORIF) FIFTH METATARSAL FRACTURE;  Surgeon: Lamar DELENA Millman, MD;  [facility];  Service: Orthopedics;  Laterality: Left;   POLYPECTOMY  08/27/2016   Procedure: POLYPECTOMY;  Surgeon: Claudis PENNER Golda, MD;  [facility];  Service: Endoscopy;;  colon   POLYPECTOMY  12/12/2021   Procedure: POLYPECTOMY;  Surgeon: Golda Claudis PENNER, MD;  [facility];  Service: Endoscopy;;   rotor cuff  12/02/2007   right-   TONSILLECTOMY     TOTAL SHOULDER REPLACEMENT Left    UPPER GASTROINTESTINAL ENDOSCOPY  Home Medications:  Allergies as of 12/19/2024       Reactions   Metformin    Amoxicillin  Rash   Ampicillin Rash        Medication List        Accurate as of December 19, 2024 12:17 PM. If you have any questions, ask your nurse or doctor.          amLODipine  5 MG tablet Commonly known as: NORVASC  Take 5 mg by mouth daily.   aspirin EC 81 MG tablet Take 1 tablet (81 mg total) by mouth daily. Swallow whole.   azithromycin  250 MG tablet Commonly known as: ZITHROMAX  Take first 2 tablets together, then 1 every day until finished.   chlorthalidone  25 MG tablet Commonly known as:  HYGROTON  Take 25 mg by mouth daily.   cyclobenzaprine  5 MG tablet Commonly known as: FLEXERIL  Take 1 tablet (5 mg total) by mouth 3 (three) times daily as needed for muscle spasms.   docusate sodium  100 MG capsule Commonly known as: COLACE Take 1 capsule (100 mg total) by mouth 2 (two) times daily.   ezetimibe  10 MG tablet Commonly known as: ZETIA  Take 10 mg by mouth at bedtime.   Fish Oil 1000 MG Caps Take 1,000 mg by mouth daily.   gabapentin 300 MG capsule Commonly known as: NEURONTIN Take 600 mg by mouth 2 (two) times daily.   Gemtesa  75 MG Tabs Generic drug: Vibegron  Take 1 tablet (75 mg total) by mouth daily.   GINGER PO Take 1 capsule by mouth daily.   Glucosamine HCl 1000 MG Tabs Take 2,000 mg by mouth daily.   losartan  100 MG tablet Commonly known as: COZAAR  Take 100 mg by mouth daily.   multivitamin with minerals Tabs tablet Take 1 tablet by mouth daily.   omeprazole 20 MG capsule Commonly known as: PRILOSEC Take 20 mg by mouth daily.   oxybutynin  5 MG tablet Commonly known as: DITROPAN  Take 1 tablet (5 mg total) by mouth at bedtime.   pioglitazone  15 MG tablet Commonly known as: ACTOS  Take 15 mg by mouth daily.   tamsulosin  0.4 MG Caps capsule Commonly known as: FLOMAX  Take 1 capsule (0.4 mg total) by mouth daily.   TART CHERRY ADVANCED PO Take 1 tablet by mouth daily.   triamcinolone  cream 0.1 % Commonly known as: KENALOG  Apply 1 Application topically 2 (two) times daily as needed.   TURMERIC PO Take 1 capsule by mouth daily.        Allergies: Allergies[1]  Family History: Family History  Problem Relation Age of Onset   COPD Mother    Heart disease Father    Early death Father    Healthy Sister    Heart disease Sister    Lung cancer Brother    Heart disease Brother    Prostate cancer Brother    Healthy Son    Healthy Son     Social History:  reports that he has never smoked. He has been exposed to tobacco smoke. He has  never used smokeless tobacco. He reports that he does not drink alcohol and does not use drugs.   Physical Exam: BP 127/69   Pulse 75   Constitutional:  Alert and oriented, No acute distress. HEENT: [place] AT, moist mucus membranes.  Trachea midline, no masses. Cardiovascular: No clubbing, cyanosis, or edema. Respiratory: Normal respiratory effort, no increased work of breathing. GI: Abdomen is soft, nontender, nondistended, no abdominal masses GU: No CVA tenderness Skin: No rashes, bruises  or suspicious lesions. Neurologic: Grossly intact, no focal deficits, moving all 4 extremities. Psychiatric: Normal mood and affect.  Laboratory Data: Lab Results  Component Value Date   WBC 8.5 09/27/2024   HGB 15.4 09/27/2024   HCT 46.3 09/27/2024   MCV 89.0 09/27/2024   PLT 322 09/27/2024    Lab Results  Component Value Date   CREATININE 1.15 09/27/2024    No results found for: PSA  No results found for: TESTOSTERONE  Lab Results  Component Value Date   HGBA1C 6.0 (H) 05/28/2023    Urinalysis    Component Value Date/Time   COLORURINE YELLOW 06/19/2023 2150   APPEARANCEUR Clear 06/08/2024 1140   LABSPEC 1.018 06/19/2023 2150   PHURINE 5.0 06/19/2023 2150   GLUCOSEU Negative 06/08/2024 1140   HGBUR MODERATE (A) 06/19/2023 2150   BILIRUBINUR Negative 06/08/2024 1140   KETONESUR NEGATIVE 06/19/2023 2150   PROTEINUR Negative 06/08/2024 1140   PROTEINUR 30 (A) 06/19/2023 2150   UROBILINOGEN 0.2 03/27/2015 2221   NITRITE Negative 06/08/2024 1140   NITRITE NEGATIVE 06/19/2023 2150   LEUKOCYTESUR Negative 06/08/2024 1140   LEUKOCYTESUR NEGATIVE 06/19/2023 2150    Lab Results  Component Value Date   LABMICR See below: 06/08/2024   WBCUA None seen 06/08/2024   LABEPIT 0-10 06/08/2024   BACTERIA None seen 06/08/2024    Pertinent Imaging: KUB and CT scan reviewed.  Images. Results for orders placed during the hospital encounter of 06/08/24  DG Abd 1  View  Narrative CLINICAL DATA:  Kidney stones.  EXAM: ABDOMEN - 1 VIEW  COMPARISON:  06/19/2023  FINDINGS: Renal calculi on prior CT are not definitively identified by radiograph. Calcifications in the pelvis are typical of phleboliths. Normal bowel gas pattern. Small to moderate colonic stool burden. Lower lumbar fusion hardware.  IMPRESSION: Renal calculi on prior CT are not definitively identified by radiograph.   Electronically Signed By: Andrea Gasman M.D. On: 06/10/2024 15:58  No results found for this or any previous visit.  No results found for this or any previous visit.  No results found for this or any previous visit.  Results for orders placed during the hospital encounter of 05/01/20  US  RENAL  Narrative CLINICAL DATA:  Initial evaluation for abnormal finding of blood chemistry.  EXAM: RENAL / URINARY TRACT ULTRASOUND COMPLETE  COMPARISON:  Prior CT from 03/15/2010.  FINDINGS: Right Kidney:  Renal measurements: 12.8 x 5.8 x 7.2 cm = volume: 284 mL. Renal echogenicity within normal limits. 2 mm nonobstructive stone present at the upper pole. No hydronephrosis. 0.8 x 0.8 x 1.0 cm simple cyst present at the upper pole. No other focal renal mass.  Left Kidney:  Renal measurements: 12.3 x 5.5 x 5.8 cm = volume: 211 mL. Echogenicity within normal limits. No nephrolithiasis or hydronephrosis. No focal renal mass.  Bladder:  Appears normal for degree of bladder distention.  Other:  None.  IMPRESSION: 1. 2 mm nonobstructive right renal nephrolithiasis. No hydronephrosis. 2. 1 cm simple cyst at the upper pole the right kidney. 3. Otherwise unremarkable renal ultrasound.   Electronically Signed By: Morene Hoard M.D. On: 05/02/2020 00:48  No results found for this or any previous visit.  No results found for this or any previous visit.  No results found for this or any previous visit.   - Urinalysis, Routine w reflex  microscopic; Future   No follow-ups on file.  Donnice Brooks, MD  [facility] [place]  [address] [place], [place]  [address] [phone]       [1]  Allergies Allergen Reactions   Metformin    Amoxicillin  Rash   Ampicillin Rash   "

## 2024-12-27 ENCOUNTER — Telehealth: Payer: Self-pay

## 2024-12-27 DIAGNOSIS — I10 Essential (primary) hypertension: Secondary | ICD-10-CM

## 2024-12-27 NOTE — Progress Notes (Signed)
 Complex Care Management Note Care Guide Note  12/27/2024 Name: Frank [name] MRN: [id number] DOB: [birth year]   Complex Care Management Outreach Attempts: An unsuccessful telephone outreach was attempted today to offer the patient information about available complex care management services.  Follow Up Plan:  No further outreach attempts will be made at this time. We have been unable to contact the patient to offer or enroll patient in complex care management services.  Encounter Outcome:  Patient Refused  Leotis Rase [facility], [facility] Guide  Direct Dial: [phone]  Fax [phone]

## 2024-12-28 ENCOUNTER — Telehealth: Payer: Self-pay

## 2024-12-28 DIAGNOSIS — N401 Enlarged prostate with lower urinary tract symptoms: Secondary | ICD-10-CM

## 2024-12-28 DIAGNOSIS — R351 Nocturia: Secondary | ICD-10-CM

## 2024-12-28 NOTE — Telephone Encounter (Signed)
 SABRA

## 2024-12-30 MED ORDER — TAMSULOSIN HCL 0.4 MG PO CAPS: 0.4000 mg | ORAL_CAPSULE | Freq: Every day | ORAL | 3 refills | Status: AC

## 2024-12-30 NOTE — Telephone Encounter (Signed)
 LVM making him aware of Rx sent to pharmacy.

## 2024-12-30 NOTE — Telephone Encounter (Signed)
 Yes, okay to refill tamsulosin  0.4 mg p.o. daily, #90 with 3 refills. Thank you.

## 2024-12-30 NOTE — Addendum Note (Signed)
 Addended by: GRETTA MASTERS R on: 12/30/2024 02:55 PM   Modules accepted: Orders

## 2025-04-04 ENCOUNTER — Ambulatory Visit: Admitting: Rheumatology

## 2025-12-18 ENCOUNTER — Ambulatory Visit: Admitting: Urology
